# Patient Record
Sex: Female | Born: 1940 | Race: White | Hispanic: No | Smoking: Never smoker
Health system: Southern US, Community
[De-identification: ages and names within clinical notes are randomized; demographics above are authoritative.]

## PROBLEM LIST (undated history)

## (undated) DIAGNOSIS — K219 Gastro-esophageal reflux disease without esophagitis: Secondary | ICD-10-CM

## (undated) DIAGNOSIS — Z9889 Other specified postprocedural states: Secondary | ICD-10-CM

## (undated) DIAGNOSIS — E785 Hyperlipidemia, unspecified: Secondary | ICD-10-CM

## (undated) DIAGNOSIS — M8580 Other specified disorders of bone density and structure, unspecified site: Secondary | ICD-10-CM

## (undated) DIAGNOSIS — D509 Iron deficiency anemia, unspecified: Secondary | ICD-10-CM

## (undated) DIAGNOSIS — R011 Cardiac murmur, unspecified: Secondary | ICD-10-CM

## (undated) DIAGNOSIS — G4733 Obstructive sleep apnea (adult) (pediatric): Secondary | ICD-10-CM

## (undated) DIAGNOSIS — I340 Nonrheumatic mitral (valve) insufficiency: Secondary | ICD-10-CM

## (undated) DIAGNOSIS — I2510 Atherosclerotic heart disease of native coronary artery without angina pectoris: Secondary | ICD-10-CM

## (undated) DIAGNOSIS — F419 Anxiety disorder, unspecified: Secondary | ICD-10-CM

## (undated) DIAGNOSIS — I350 Nonrheumatic aortic (valve) stenosis: Secondary | ICD-10-CM

## (undated) DIAGNOSIS — E119 Type 2 diabetes mellitus without complications: Secondary | ICD-10-CM

## (undated) DIAGNOSIS — M1990 Unspecified osteoarthritis, unspecified site: Secondary | ICD-10-CM

## (undated) DIAGNOSIS — I10 Essential (primary) hypertension: Secondary | ICD-10-CM

## (undated) DIAGNOSIS — N8110 Cystocele, unspecified: Secondary | ICD-10-CM

## (undated) DIAGNOSIS — N183 Chronic kidney disease, stage 3 unspecified: Secondary | ICD-10-CM

## (undated) DIAGNOSIS — R112 Nausea with vomiting, unspecified: Secondary | ICD-10-CM

## (undated) DIAGNOSIS — F32A Depression, unspecified: Secondary | ICD-10-CM

## (undated) DIAGNOSIS — I219 Acute myocardial infarction, unspecified: Secondary | ICD-10-CM

## (undated) HISTORY — DX: Type 2 diabetes mellitus without complications: E11.9

## (undated) HISTORY — PX: CAROTID STENT: SHX1301

## (undated) HISTORY — DX: Atherosclerotic heart disease of native coronary artery without angina pectoris: I25.10

## (undated) HISTORY — PX: EYE SURGERY: SHX253

## (undated) HISTORY — DX: Essential (primary) hypertension: I10

## (undated) HISTORY — DX: Hyperlipidemia, unspecified: E78.5

## (undated) HISTORY — PX: JOINT REPLACEMENT: SHX530

---

## 1981-03-28 HISTORY — PX: CARPAL TUNNEL RELEASE: SHX101

## 1982-03-28 HISTORY — PX: CYST EXCISION: SHX5701

## 2005-03-28 HISTORY — PX: TOTAL HIP ARTHROPLASTY: SHX124

## 2005-03-28 HISTORY — PX: JOINT REPLACEMENT: SHX530

## 2005-05-26 HISTORY — PX: TOTAL HIP ARTHROPLASTY: SHX124

## 2008-03-28 DIAGNOSIS — I252 Old myocardial infarction: Secondary | ICD-10-CM

## 2008-03-28 DIAGNOSIS — I219 Acute myocardial infarction, unspecified: Secondary | ICD-10-CM

## 2008-03-28 HISTORY — DX: Old myocardial infarction: I25.2

## 2008-03-28 HISTORY — PX: CORONARY ANGIOPLASTY WITH STENT PLACEMENT: SHX49

## 2008-03-28 HISTORY — DX: Acute myocardial infarction, unspecified: I21.9

## 2018-03-01 DIAGNOSIS — F329 Major depressive disorder, single episode, unspecified: Secondary | ICD-10-CM | POA: Insufficient documentation

## 2018-03-01 DIAGNOSIS — D509 Iron deficiency anemia, unspecified: Secondary | ICD-10-CM | POA: Insufficient documentation

## 2018-03-01 DIAGNOSIS — K219 Gastro-esophageal reflux disease without esophagitis: Secondary | ICD-10-CM | POA: Insufficient documentation

## 2018-03-01 DIAGNOSIS — M159 Polyosteoarthritis, unspecified: Secondary | ICD-10-CM | POA: Insufficient documentation

## 2018-03-01 DIAGNOSIS — I10 Essential (primary) hypertension: Secondary | ICD-10-CM | POA: Insufficient documentation

## 2018-03-01 DIAGNOSIS — I2510 Atherosclerotic heart disease of native coronary artery without angina pectoris: Secondary | ICD-10-CM | POA: Insufficient documentation

## 2018-03-01 DIAGNOSIS — E782 Mixed hyperlipidemia: Secondary | ICD-10-CM | POA: Insufficient documentation

## 2018-03-01 DIAGNOSIS — F32A Depression, unspecified: Secondary | ICD-10-CM | POA: Insufficient documentation

## 2018-03-01 DIAGNOSIS — E119 Type 2 diabetes mellitus without complications: Secondary | ICD-10-CM | POA: Insufficient documentation

## 2018-03-01 DIAGNOSIS — G4733 Obstructive sleep apnea (adult) (pediatric): Secondary | ICD-10-CM | POA: Insufficient documentation

## 2018-03-01 DIAGNOSIS — Z9989 Dependence on other enabling machines and devices: Secondary | ICD-10-CM

## 2018-03-13 DIAGNOSIS — I350 Nonrheumatic aortic (valve) stenosis: Secondary | ICD-10-CM | POA: Insufficient documentation

## 2018-04-30 ENCOUNTER — Encounter: Payer: Self-pay | Admitting: Psychiatry

## 2018-04-30 ENCOUNTER — Ambulatory Visit (INDEPENDENT_AMBULATORY_CARE_PROVIDER_SITE_OTHER): Payer: Medicare Other | Admitting: Psychiatry

## 2018-04-30 ENCOUNTER — Other Ambulatory Visit: Payer: Self-pay

## 2018-04-30 VITALS — BP 168/83 | HR 60 | Temp 98.1°F | Ht 62.8 in | Wt 180.0 lb

## 2018-04-30 DIAGNOSIS — F411 Generalized anxiety disorder: Secondary | ICD-10-CM | POA: Diagnosis not present

## 2018-04-30 DIAGNOSIS — F410 Panic disorder [episodic paroxysmal anxiety] without agoraphobia: Secondary | ICD-10-CM | POA: Diagnosis not present

## 2018-04-30 DIAGNOSIS — F331 Major depressive disorder, recurrent, moderate: Secondary | ICD-10-CM | POA: Diagnosis not present

## 2018-04-30 MED ORDER — SERTRALINE HCL 100 MG PO TABS: 100.0000 mg | ORAL_TABLET | Freq: Every day | ORAL | 1 refills | Status: DC

## 2018-04-30 MED ORDER — MIRTAZAPINE 15 MG PO TABS: 15.0000 mg | ORAL_TABLET | Freq: Every day | ORAL | 1 refills | Status: DC

## 2018-04-30 NOTE — Progress Notes (Signed)
Psychiatric Initial Adult Assessment   Patient Identification: Jean Stout MRN:  [id number] Date of Evaluation:  04/30/2018 Referral Source: self Chief Complaint:  ' I am here to establish care.' Chief Complaint    Establish Care; Depression; Medication Problem     Visit Diagnosis:    ICD-10-CM   1. MDD (major depressive disorder), recurrent episode, moderate (HCC) F33.1 mirtazapine (REMERON) 15 MG tablet  2. GAD (generalized anxiety disorder) F41.1   3. Panic attacks F41.0     History of Present Illness:  Jean Stout is a 78 year old Caucasian female, widowed, lives in [place], has a history of depression, anxiety, hypertension, hyperlipidemia, coronary artery disease, diabetes melitis, presented to clinic today to establish care.  Patient reports she has been struggling with depressive symptoms since the past few months.  She reports having a history of depression and was started on Zoloft 4 years ago.  Patient reports 2 years back her Zoloft was readjusted to 200 mg.  She continues to take the Zoloft daily however she does not think it is helpful at this time.  Patient reports she relocated from [place] to [place] in November 2019.  She reports she wanted to be closer to her twin sister and that is why she moved here.  Patient reports ever since she moved in here her mood symptoms have been worsening.  She describes anhedonia, lack of motivation, sleep problems, lack of energy, sadness, crying spells and so on on a regular basis.  Pt also reports anxiety symptoms like worrying about everything to the extreme, feeling nervous, restless, negative self-image and so on on a regular basis which has been worsening since the past 1 month.  Patient also reports anxiety attacks.  She reports she had at least 4 panic attacks previously.  The last one was in September 2019.  She reports she had shortness of breath and feeling of impending doom which lasted for a few minutes.  Patient  reports sleep problems.  She reports this has been going on since the past several months.  She reports it is difficult for her to fall asleep.  Reports she used to take melatonin however she felt worse on the melatonin and hence she stopped it.  Patient denies any manic or hypomanic symptoms.  Patient denies any trauma history.  Patient denies any perceptual disturbances.  Patient reports she has social support from her niece.  Patient has several psychosocial stressors including recent relocation, mental health problems of her twin sister, health problems of her daughter, relationship conflicts with her son, financial problems and so on.    Associated Signs/Symptoms: Depression Symptoms:  depressed mood, anhedonia, fatigue, difficulty concentrating, anxiety, panic attacks, disturbed sleep, (Hypo) Manic Symptoms:  denies Anxiety Symptoms:  Excessive Worry, Panic Symptoms, Psychotic Symptoms:  denies PTSD Symptoms: Negative  Past Psychiatric History: Patient was diagnosed with depression 4 years ago.  She reports she has been on the Zoloft ever since.  She denies any suicide attempts.  She denies any inpatient mental health admissions.  Previous Psychotropic Medications: Yes Zoloft, melatonin  Substance Abuse History in the last 12 months:  No.  Consequences of Substance Abuse: Negative  Past Medical History:  Past Medical History:  Diagnosis Date  . Coronary artery disease   . DM (diabetes mellitus) (HCC)   . HLD (hyperlipidemia)   . HTN (hypertension)     Past Surgical History:  Procedure Laterality Date  . CAROTID STENT      Family Psychiatric History: Twin sister-bipolar disorder  Family History:  Family History  Problem Relation Age of Onset  . Bipolar disorder Sister     Social History:   Social History   Socioeconomic History  . Marital status: Widowed    Spouse name: Not on file  . Number of children: 3  . Years of education: Not on file  .  Highest education level: Bachelor's degree (e.g., BA, AB, BS)  Occupational History  . Not on file  Social Needs  . Financial resource strain: Hard  . Food insecurity:    Worry: Often true    Inability: Often true  . Transportation needs:    Medical: No    Non-medical: No  Tobacco Use  . Smoking status: Never Smoker  . Smokeless tobacco: Never Used  Substance and Sexual Activity  . Alcohol use: Not Currently    Frequency: Never  . Drug use: Never  . Sexual activity: Not Currently  Lifestyle  . Physical activity:    Days per week: 5 days    Minutes per session: 20 min  . Stress: Rather much  Relationships  . Social connections:    Talks on phone: Not on file    Gets together: Not on file    Attends religious service: More than 4 times per year    Active member of club or organization: Yes    Attends meetings of clubs or organizations: Never    Relationship status: Widowed  Other Topics Concern  . Not on file  Social History Narrative  . Not on file    Additional Social History: Patient is widowed.  She currently lives in [place].  She recently relocated from [place].  She is a retired [employer] fundraiser.  She came to [facility] to be closer to her twin sister.  Patient has 2 sons and 1 daughter.  She has 11 grandchildren.  Allergies:   Allergies  Allergen Reactions  . Amlodipine Besy-Benazepril Hcl Rash  . Gadolinium Derivatives Rash  . Valdecoxib Rash    Metabolic Disorder Labs: No results found for: HGBA1C, MPG No results found for: PROLACTIN No results found for: CHOL, TRIG, HDL, CHOLHDL, VLDL, LDLCALC No results found for: TSH  Therapeutic Level Labs: No results found for: LITHIUM No results found for: CBMZ No results found for: VALPROATE  Current Medications: Current Outpatient Medications  Medication Sig Dispense Refill  . amLODipine (NORVASC) 10 MG tablet Take by mouth.    Marland Kitchen aspirin EC 81 MG tablet Take by mouth.    Marland Kitchen atorvastatin (LIPITOR) 40 MG tablet Take by  mouth.    . docusate sodium (COLACE) 100 MG capsule Take by mouth.    . ferrous sulfate 325 (65 FE) MG tablet Take by mouth.    . hydrALAZINE (APRESOLINE) 50 MG tablet Take by mouth 3 (three) times daily.     . hydrochlorothiazide (HYDRODIURIL) 12.5 MG tablet Take by mouth.    Marland Kitchen lisinopril (PRINIVIL,ZESTRIL) 40 MG tablet Take by mouth.    . metFORMIN (GLUCOPHAGE-XR) 500 MG 24 hr tablet Take by mouth.    . naproxen sodium (ALEVE) 220 MG tablet Take by mouth.    . pantoprazole (PROTONIX) 40 MG tablet Take by mouth.    . sertraline (ZOLOFT) 100 MG tablet Take 1 tablet (100 mg total) by mouth daily. 30 tablet 1  . mirtazapine (REMERON) 15 MG tablet Take 1 tablet (15 mg total) by mouth at bedtime. 30 tablet 1   No current facility-administered medications for this visit.     Musculoskeletal: Strength &  Muscle Tone: within normal limits Gait & Station: normal Patient leans: N/A  Psychiatric Specialty Exam: Review of Systems  Psychiatric/Behavioral: Positive for depression. The patient is nervous/anxious and has insomnia.   All other systems reviewed and are negative.   Blood pressure (!) 168/83, pulse 60, temperature 98.1 F (36.7 C), temperature source Oral, height 5' 2.8" (1.595 m), weight 180 lb (81.6 kg).Body mass index is 32.09 kg/m.  General Appearance: Casual  Eye Contact:  Fair  Speech:  Clear and Coherent  Volume:  Normal  Mood:  Depressed  Affect:  Appropriate  Thought Process:  Goal Directed and Descriptions of Associations: Intact  Orientation:  Full (Time, Place, and Person)  Thought Content:  Logical  Suicidal Thoughts:  No  Homicidal Thoughts:  No  Memory:  Immediate;   Fair Recent;   Fair Remote;   Fair  Judgement:  Fair  Insight:  Fair  Psychomotor Activity:  Normal  Concentration:  Concentration: Fair and Attention Span: Fair  Recall:  FiservFair  Fund of Knowledge:Fair  Language: Fair  Akathisia:  No  Handed:  Right  AIMS (if indicated):denies tremors,  rigidity,stiffness  Assets:  Communication Skills Desire for Improvement Housing Social Support  ADL's:  Intact  Cognition: WNL  Sleep:  Poor   Screenings:   Assessment and Plan: Claris CheMargaret is a 78 year old Caucasian female who is retired, lives in [place], recently relocated from [place], has a history of depression, hyperlipidemia, hypertension, diabetes mellitus, coronary artery disease, presented to clinic today to establish care.  Patient is biologically predisposed given her family history.  Patient also has several psychosocial stressors including financial as well as recent relocation.  Patient will benefit from medications as well as psychotherapy sessions.  Plan MDD PHQ 9 equals 21 Reduce Zoloft to 100 mg p.o. daily Start mirtazapine 15 mg p.o. nightly Referral for CBT with therapist-Ms. Felecia Janina Thompson.  For GAD GAD 7 equals 18 Zoloft as prescribed Mirtazapine will also help. Refer for CBT  For panic attacks Refer for CBT.  For insomnia OSA on CPAP Start mirtazapine as prescribed  I will order the following labs-TSH, vitamin b12.  Discussed with patient elevated blood pressure reading today, discussed with her to reach out to her primary medical doctor.  Follow-up in clinic in 2 weeks or sooner if needed.  I have spent atleast 60 minutes face to face with patient today. More than 50 % of the time was spent for psychoeducation and supportive psychotherapy and care coordination.  This note was generated in part or whole with voice recognition software. Voice recognition is usually quite accurate but there are transcription errors that can and very often do occur. I apologize for any typographical errors that were not detected and corrected.         Jomarie LongsSaramma Sady Monaco, MD 2/3/20201:59 PM

## 2018-04-30 NOTE — Patient Instructions (Signed)
Mirtazapine tablets  What is this medicine?  MIRTAZAPINE (mir TAZ a peen) is used to treat depression.  This medicine may be used for other purposes; ask your health care provider or pharmacist if you have questions.  COMMON BRAND NAME(S): Remeron  What should I tell my health care provider before I take this medicine?  They need to know if you have any of these conditions:  -bipolar disorder  -glaucoma  -kidney disease  -liver disease  -suicidal thoughts  -an unusual or allergic reaction to mirtazapine, other medicines, foods, dyes, or preservatives  -pregnant or trying to get pregnant  -breast-feeding  How should I use this medicine?  Take this medicine by mouth with a glass of water. Follow the directions on the prescription label. Take your medicine at regular intervals. Do not take your medicine more often than directed. Do not stop taking this medicine suddenly except upon the advice of your doctor. Stopping this medicine too quickly may cause serious side effects or your condition may worsen.  A special MedGuide will be given to you by the pharmacist with each prescription and refill. Be sure to read this information carefully each time.  Talk to your pediatrician regarding the use of this medicine in children. Special care may be needed.  Overdosage: If you think you have taken too much of this medicine contact a poison control center or emergency room at once.  NOTE: This medicine is only for you. Do not share this medicine with others.  What if I miss a dose?  If you miss a dose, take it as soon as you can. If it is almost time for your next dose, take only that dose. Do not take double or extra doses.  What may interact with this medicine?  Do not take this medicine with any of the following medications:  -linezolid  -MAOIs like Carbex, Eldepryl, Marplan, Nardil, and Parnate  -methylene blue (injected into a vein)  This medicine may also interact with the following medications:  -alcohol  -antiviral  medicines for HIV or AIDS  -certain medicines that treat or prevent blood clots like warfarin  -certain medicines for depression, anxiety, or psychotic disturbances  -certain medicines for fungal infections like ketoconazole and itraconazole  -certain medicines for migraine headache like almotriptan, eletriptan, frovatriptan, naratriptan, rizatriptan, sumatriptan, zolmitriptan  -certain medicines for seizures like carbamazepine or phenytoin  -certain medicines for sleep  -cimetidine  -erythromycin  -fentanyl  -lithium  -medicines for blood pressure  -nefazodone  -rasagiline  -rifampin  -supplements like St. John's wort, kava kava, valerian  -tramadol  -tryptophan  This list may not describe all possible interactions. Give your health care provider a list of all the medicines, herbs, non-prescription drugs, or dietary supplements you use. Also tell them if you smoke, drink alcohol, or use illegal drugs. Some items may interact with your medicine.  What should I watch for while using this medicine?  Tell your doctor if your symptoms do not get better or if they get worse. Visit your doctor or health care professional for regular checks on your progress. Because it may take several weeks to see the full effects of this medicine, it is important to continue your treatment as prescribed by your doctor.  Patients and their families should watch out for new or worsening thoughts of suicide or depression. Also watch out for sudden changes in feelings such as feeling anxious, agitated, panicky, irritable, hostile, aggressive, impulsive, severely restless, overly excited and hyperactive, or not being   able to sleep. If this happens, especially at the beginning of treatment or after a change in dose, call your health care professional.  You may get drowsy or dizzy. Do not drive, use machinery, or do anything that needs mental alertness until you know how this medicine affects you. Do not stand or sit up quickly, especially if  you are an older patient. This reduces the risk of dizzy or fainting spells. Alcohol may interfere with the effect of this medicine. Avoid alcoholic drinks.  This medicine may cause dry eyes and blurred vision. If you wear contact lenses you may feel some discomfort. Lubricating drops may help. See your eye doctor if the problem does not go away or is severe.  Your mouth may get dry. Chewing sugarless gum or sucking hard candy, and drinking plenty of water may help. Contact your doctor if the problem does not go away or is severe.  What side effects may I notice from receiving this medicine?  Side effects that you should report to your doctor or health care professional as soon as possible:  -allergic reactions like skin rash, itching or hives, swelling of the face, lips, or tongue  -anxious  -changes in vision  -chest pain  -confusion  -elevated mood, decreased need for sleep, racing thoughts, impulsive behavior  -eye pain  -fast, irregular heartbeat  -feeling faint or lightheaded, falls  -feeling agitated, angry, or irritable  -fever or chills, sore throat  -hallucination, loss of contact with reality  -loss of balance or coordination  -mouth sores  -redness, blistering, peeling or loosening of the skin, including inside the mouth  -restlessness, pacing, inability to keep still  -seizures  -stiff muscles  -suicidal thoughts or other mood changes  -trouble passing urine or change in the amount of urine  -trouble sleeping  -unusual bleeding or bruising  -unusually weak or tired  -vomiting  Side effects that usually do not require medical attention (report to your doctor or health care professional if they continue or are bothersome):  -change in appetite  -constipation  -dizziness  -dry mouth  -muscle aches or pains  -nausea  -tired  -weight gain  This list may not describe all possible side effects. Call your doctor for medical advice about side effects. You may report side effects to FDA at 1-800-FDA-1088.  Where  should I keep my medicine?  Keep out of the reach of children.  Store at room temperature between 15 and 30 degrees C (59 and 86 degrees F) Protect from light and moisture. Throw away any unused medicine after the expiration date.  NOTE: This sheet is a summary. It may not cover all possible information. If you have questions about this medicine, talk to your doctor, pharmacist, or health care provider.  © 2019 [employer] (2015-08-13 17:30:45)

## 2018-05-01 ENCOUNTER — Telehealth: Payer: Self-pay

## 2018-05-01 NOTE — Telephone Encounter (Signed)
Called patient and asked if she can see if her pcp can order the labwork or if they have done recently to have them fax a copy to our office.

## 2018-05-01 NOTE — Telephone Encounter (Signed)
pt called states she can not get her labwork done. she states she went to [facility] and they told her that it was the wrong code and that medicare would not pay for test with that code.

## 2018-05-01 NOTE — Telephone Encounter (Signed)
Ok . Pls ask her if she can ask her Primary to do it for her.

## 2018-05-02 ENCOUNTER — Telehealth: Payer: Self-pay | Admitting: Psychiatry

## 2018-05-02 NOTE — Telephone Encounter (Signed)
I have reviewed medical records received from [facility] clinic, family medicine. Reviewed CBC with differential reported 03/02/2018-RBC-low at 3.80, hemoglobin -low at 11.8, MPV-low at 8.7-otherwise within normal limits, CMP-globular filtration rate at 54-low, otherwise within normal limits Hemoglobin A1c elevated at 6.1-she will continue to follow-up with her primary medical doctor for management Lipid panel-total cholesterol-within normal limits, LDL, HDL-within normal limits Iron panel -total iron binding capacity, ferritin-within normal limits TSH pending.

## 2018-05-21 ENCOUNTER — Ambulatory Visit (INDEPENDENT_AMBULATORY_CARE_PROVIDER_SITE_OTHER): Payer: Medicare Other | Admitting: Psychiatry

## 2018-05-21 ENCOUNTER — Other Ambulatory Visit: Payer: Self-pay

## 2018-05-21 ENCOUNTER — Encounter: Payer: Self-pay | Admitting: Psychiatry

## 2018-05-21 VITALS — BP 141/64 | HR 52 | Temp 98.3°F | Wt 177.6 lb

## 2018-05-21 DIAGNOSIS — F410 Panic disorder [episodic paroxysmal anxiety] without agoraphobia: Secondary | ICD-10-CM | POA: Diagnosis not present

## 2018-05-21 DIAGNOSIS — F331 Major depressive disorder, recurrent, moderate: Secondary | ICD-10-CM | POA: Diagnosis not present

## 2018-05-21 DIAGNOSIS — F411 Generalized anxiety disorder: Secondary | ICD-10-CM | POA: Diagnosis not present

## 2018-05-21 MED ORDER — DULOXETINE HCL 30 MG PO CPEP: 30.0000 mg | ORAL_CAPSULE | Freq: Every day | ORAL | 1 refills | Status: DC

## 2018-05-21 NOTE — Progress Notes (Signed)
BH MD OP Progress Note  05/21/2018 12:20 PM Jean Stout  MRN:  [id number]  Chief Complaint: ' I am here for follow up.' Chief Complaint    Follow-up; Medication Refill; Pain     HPI: Jean Stout is a 78 yr old patient female, widowed, lives in [place], has a history of depression, anxiety, hypertension, hyperlipidemia, coronary artery disease, diabetes melitis, presented to clinic today for a follow-up visit.  Patient today reports she stopped taking the Zoloft altogether.  She denies any withdrawal symptoms.  She also reports she could not take the mirtazapine for sleep since it made her appetite higher.  She reports for 3 days she kept eating and eating and hence stopped taking it.  She reports she is currently on a keto diet which changed a lot for her.  She reports she is actually losing weight.  She also feels able to do more stuff around her house.  She was able to unpack and organize a lot around her house.  This is the first time she has felt this way in the past 2 to 3 months.  Patient reports she does sleep however it takes an hour or so for her to fall asleep.  She is currently back on melatonin.  Patient reports she struggles with pain a lot.  She used to be on tramadol previously however her provider stopped prescribing it for her.  She is trying to get an appointment with pain management.  She is currently taking meloxicam however it does not help much.  Patient wonders if she can be prescribed Cymbalta which also helps with pain.  She reports that even though she feels the past 2 weeks has been better she does struggle with depression and anxiety on and off.  Patient reports she had to reschedule her appointment with Ms. Felecia Jan and has an upcoming appointment March 11.  She looks forward to it.  Patient denies any other concerns today. Visit Diagnosis:    ICD-10-CM   1. MDD (major depressive disorder), recurrent episode, moderate (HCC) F33.1 DULoxetine (CYMBALTA) 30 MG  capsule  2. GAD (generalized anxiety disorder) F41.1 DULoxetine (CYMBALTA) 30 MG capsule  3. Panic attacks F41.0     Past Psychiatric History: Reviewed past psychiatric history from my progress note on 04/30/2018.  Past trials of Zoloft, melatonin  Past Medical History:  Past Medical History:  Diagnosis Date  . Coronary artery disease   . DM (diabetes mellitus) (HCC)   . HLD (hyperlipidemia)   . HTN (hypertension)     Past Surgical History:  Procedure Laterality Date  . CAROTID STENT      Family Psychiatric History: I have reviewed family psychiatric history from my progress note on 04/30/2018 Family History:  Family History  Problem Relation Age of Onset  . Bipolar disorder Sister     Social History: Reviewed social history from my progress note on 04/30/2018. Social History   Socioeconomic History  . Marital status: Widowed    Spouse name: Not on file  . Number of children: 3  . Years of education: Not on file  . Highest education level: Bachelor's degree (e.g., BA, AB, BS)  Occupational History  . Not on file  Social Needs  . Financial resource strain: Hard  . Food insecurity:    Worry: Often true    Inability: Often true  . Transportation needs:    Medical: No    Non-medical: No  Tobacco Use  . Smoking status: Never Smoker  . Smokeless  tobacco: Never Used  Substance and Sexual Activity  . Alcohol use: Not Currently    Frequency: Never  . Drug use: Never  . Sexual activity: Not Currently  Lifestyle  . Physical activity:    Days per week: 5 days    Minutes per session: 20 min  . Stress: Rather much  Relationships  . Social connections:    Talks on phone: Not on file    Gets together: Not on file    Attends religious service: More than 4 times per year    Active member of club or organization: Yes    Attends meetings of clubs or organizations: Never    Relationship status: Widowed  Other Topics Concern  . Not on file  Social History Narrative  . Not on  file    Allergies:  Allergies  Allergen Reactions  . Amlodipine Besy-Benazepril Hcl Rash  . Gadolinium Derivatives Rash  . Valdecoxib Rash    Metabolic Disorder Labs: No results found for: HGBA1C, MPG No results found for: PROLACTIN No results found for: CHOL, TRIG, HDL, CHOLHDL, VLDL, LDLCALC No results found for: TSH  Therapeutic Level Labs: No results found for: LITHIUM No results found for: VALPROATE No components found for:  CBMZ  Current Medications: Current Outpatient Medications  Medication Sig Dispense Refill  . amLODipine (NORVASC) 10 MG tablet Take by mouth.    Marland Kitchen aspirin EC 81 MG tablet Take by mouth.    Marland Kitchen atorvastatin (LIPITOR) 40 MG tablet Take by mouth.    . docusate sodium (COLACE) 100 MG capsule Take by mouth.    . ferrous sulfate 325 (65 FE) MG tablet Take by mouth.    . hydrALAZINE (APRESOLINE) 50 MG tablet Take by mouth 3 (three) times daily.     . hydrochlorothiazide (HYDRODIURIL) 12.5 MG tablet Take by mouth.    Marland Kitchen lisinopril (PRINIVIL,ZESTRIL) 40 MG tablet Take by mouth.    . meloxicam (MOBIC) 15 MG tablet Take by mouth.    . metFORMIN (GLUCOPHAGE-XR) 500 MG 24 hr tablet Take by mouth.    . naproxen sodium (ALEVE) 220 MG tablet Take by mouth.    . pantoprazole (PROTONIX) 40 MG tablet Take by mouth.    . DULoxetine (CYMBALTA) 30 MG capsule Take 1 capsule (30 mg total) by mouth daily. 30 capsule 1   No current facility-administered medications for this visit.      Musculoskeletal: Strength & Muscle Tone: within normal limits Gait & Station: normal Patient leans: N/A  Psychiatric Specialty Exam: Review of Systems  Psychiatric/Behavioral: Positive for depression. The patient is nervous/anxious.   All other systems reviewed and are negative.   Blood pressure (!) 141/64, pulse (!) 52, temperature 98.3 F (36.8 C), temperature source Oral, weight 177 lb 9.6 oz (80.6 kg).Body mass index is 31.67 kg/m.  General Appearance: Casual  Eye Contact:   Fair  Speech:  Clear and Coherent  Volume:  Normal  Mood:  Anxious and Depressed  Affect:  Congruent  Thought Process:  Goal Directed and Descriptions of Associations: Intact  Orientation:  Full (Time, Place, and Person)  Thought Content: Logical   Suicidal Thoughts:  No  Homicidal Thoughts:  No  Memory:  Immediate;   Fair Recent;   Fair Remote;   Fair  Judgement:  Fair  Insight:  Fair  Psychomotor Activity:  Normal  Concentration:  Concentration: Fair and Attention Span: Fair  Recall:  [employer] of Knowledge: Fair  Language: Fair  Akathisia:  No  Handed:  Right  AIMS (if indicated): denies tremors, rigidity,stiffness  Assets:  Communication Skills Desire for Improvement Social Support  ADL's:  Intact  Cognition: WNL  Sleep:  restless   Screenings:   Assessment and Plan: Raniyah is a 78 year old Caucasian female, retired, lives in [place], recently relocated from [place], has a history of depression, hyperlipidemia, hypertension, diabetes melitis, coronary artery disease, presented to clinic today for a follow-up visit.  Patient is biologically predisposed given her family history.  Patient also has psychosocial stressors including financial as well as recent relocation.  Did not tolerate medications prescribed last visit and hence discussed plan as noted below.  Plan MDD- unstable Start Cymbalta 30 mg p.o. daily. Discontinue Zoloft for noncompliance Discontinue mirtazapine for side effects.  GAD-unstable Start Cymbalta 30 mg p.o. daily Patient to start psychotherapy sessions with Ms. Felecia Jan.  Panic attacks-unstable Referred for CBT  For insomnia-unstable Currently on CPAP for OSA. Patient is currently on melatonin.  Follow-up in clinic in 4 weeks or sooner if needed.  I have spent atleast 15 minutes face to face with patient today. More than 50 % of the time was spent for psychoeducation and supportive psychotherapy and care coordination.  This  note was generated in part or whole with voice recognition software. Voice recognition is usually quite accurate but there are transcription errors that can and very often do occur. I apologize for any typographical errors that were not detected and corrected.        Jomarie Longs, MD 05/21/2018, 12:20 PM

## 2018-05-21 NOTE — Patient Instructions (Signed)

## 2018-06-19 ENCOUNTER — Ambulatory Visit (INDEPENDENT_AMBULATORY_CARE_PROVIDER_SITE_OTHER): Payer: Medicare Other | Admitting: Psychiatry

## 2018-06-19 ENCOUNTER — Other Ambulatory Visit: Payer: Self-pay

## 2018-06-19 ENCOUNTER — Encounter: Payer: Self-pay | Admitting: Psychiatry

## 2018-06-19 VITALS — BP 149/57 | HR 56 | Temp 97.9°F | Wt 171.2 lb

## 2018-06-19 DIAGNOSIS — F410 Panic disorder [episodic paroxysmal anxiety] without agoraphobia: Secondary | ICD-10-CM

## 2018-06-19 DIAGNOSIS — F411 Generalized anxiety disorder: Secondary | ICD-10-CM

## 2018-06-19 DIAGNOSIS — F331 Major depressive disorder, recurrent, moderate: Secondary | ICD-10-CM

## 2018-06-19 MED ORDER — DULOXETINE HCL 30 MG PO CPEP: 30.0000 mg | ORAL_CAPSULE | Freq: Every day | ORAL | 1 refills | Status: DC

## 2018-06-19 NOTE — Progress Notes (Signed)
BH MD  OP Progress Note  06/19/2018 12:55 PM Jean Stout  MRN:  [id number]  Chief Complaint: ' I am here for follow up." Chief Complaint    Follow-up; Medication Refill     HPI: Jean Stout is a 78 yr old female, widowed, lives in [place], has a history of depression, anxiety, panic attacks, hyperlipidemia, hypertension, coronary artery disease, diabetes melitis, presented to clinic today for a follow-up visit.  Patient today reports she is doing well on the Cymbalta.  She takes 30 mg daily.  She reports her mood symptoms is improved.  She reports she has good energy, she is able to organize task and is more engaged.    She reports sleep is better.  She reports she does not have any difficulty falling asleep.  She used to take melatonin however has stopped taking it.  She reports she is able to sleep by 8:30 PM however wakes up at around 1:30 AM .  She gets around 5 hours of sleep.  She reports soon after that she wakes up and gets ready and get things in order, tries to help her sister who has mental health problems and also people at church.  She reports she has to go to church every day in the morning.  Discussed with patient about her waking up too early at 1:30 AM daily.  Patient does not think it is a problem for her.  Discussed with patient to try taking melatonin to work on her sleep wake cycle.  Discussed with her to keep a log of her sleep and bring it back when she comes back.  Discussed with patient about monitoring herself for hypomanic or manic symptoms.  She does have a history of bipolar disorder in her family.  Patient continues to be in therapy with Ms. Felecia Jan and reports it is beneficial.  Visit Diagnosis:    ICD-10-CM   1. MDD (major depressive disorder), recurrent episode, moderate (HCC) F33.1 DULoxetine (CYMBALTA) 30 MG capsule  2. GAD (generalized anxiety disorder) F41.1 DULoxetine (CYMBALTA) 30 MG capsule  3. Panic attacks F41.0     Past Psychiatric History: I  have reviewed past psychiatric history from my progress note on 04/30/2018.  Past trials of Zoloft, melatonin, mirtazapine.  Past Medical History:  Past Medical History:  Diagnosis Date  . Coronary artery disease   . DM (diabetes mellitus) (HCC)   . HLD (hyperlipidemia)   . HTN (hypertension)     Past Surgical History:  Procedure Laterality Date  . CAROTID STENT      Family Psychiatric History: Reviewed family psychiatric history from my progress note on 04/30/2018.  Family History:  Family History  Problem Relation Age of Onset  . Bipolar disorder Sister     Social History: Reviewed social history from my progress note on 04/30/2018. Social History   Socioeconomic History  . Marital status: Widowed    Spouse name: Not on file  . Number of children: 3  . Years of education: Not on file  . Highest education level: Bachelor's degree (e.g., BA, AB, BS)  Occupational History  . Not on file  Social Needs  . Financial resource strain: Hard  . Food insecurity:    Worry: Often true    Inability: Often true  . Transportation needs:    Medical: No    Non-medical: No  Tobacco Use  . Smoking status: Never Smoker  . Smokeless tobacco: Never Used  Substance and Sexual Activity  . Alcohol use: Not Currently  Frequency: Never  . Drug use: Never  . Sexual activity: Not Currently  Lifestyle  . Physical activity:    Days per week: 5 days    Minutes per session: 20 min  . Stress: Rather much  Relationships  . Social connections:    Talks on phone: Not on file    Gets together: Not on file    Attends religious service: More than 4 times per year    Active member of club or organization: Yes    Attends meetings of clubs or organizations: Never    Relationship status: Widowed  Other Topics Concern  . Not on file  Social History Narrative  . Not on file    Allergies:  Allergies  Allergen Reactions  . Amlodipine Besy-Benazepril Hcl Rash  . Gadolinium Derivatives Rash  .  Valdecoxib Rash    Metabolic Disorder Labs: No results found for: HGBA1C, MPG No results found for: PROLACTIN No results found for: CHOL, TRIG, HDL, CHOLHDL, VLDL, LDLCALC No results found for: TSH  Therapeutic Level Labs: No results found for: LITHIUM No results found for: VALPROATE No components found for:  CBMZ  Current Medications: Current Outpatient Medications  Medication Sig Dispense Refill  . amLODipine (NORVASC) 10 MG tablet Take by mouth.    Marland Kitchen aspirin EC 81 MG tablet Take by mouth.    Marland Kitchen atorvastatin (LIPITOR) 40 MG tablet Take by mouth.    . docusate sodium (COLACE) 100 MG capsule Take by mouth.    . DULoxetine (CYMBALTA) 30 MG capsule Take 1 capsule (30 mg total) by mouth daily. 30 capsule 1  . ferrous sulfate 325 (65 FE) MG tablet Take by mouth.    . hydrALAZINE (APRESOLINE) 50 MG tablet Take by mouth 3 (three) times daily.     . hydrochlorothiazide (HYDRODIURIL) 12.5 MG tablet Take by mouth.    Marland Kitchen lisinopril (PRINIVIL,ZESTRIL) 40 MG tablet Take by mouth.    . meloxicam (MOBIC) 15 MG tablet Take by mouth.    . metFORMIN (GLUCOPHAGE-XR) 500 MG 24 hr tablet Take by mouth.    . naproxen sodium (ALEVE) 220 MG tablet Take by mouth.    . pantoprazole (PROTONIX) 40 MG tablet Take by mouth.     No current facility-administered medications for this visit.      Musculoskeletal: Strength & Muscle Tone: within normal limits Gait & Station: normal Patient leans: N/A  Psychiatric Specialty Exam: Review of Systems  Psychiatric/Behavioral: The patient has insomnia (reports she sleeps 5 hrs, and feels rested).   All other systems reviewed and are negative.   Blood pressure (!) 149/57, pulse (!) 56, temperature 97.9 F (36.6 C), temperature source Oral, weight 171 lb 3.2 oz (77.7 kg).Body mass index is 30.53 kg/m.  General Appearance: Casual  Eye Contact:  Fair  Speech:  Clear and Coherent  Volume:  Normal  Mood:  Euthymic  Affect:  Congruent  Thought Process:  Goal  Directed and Descriptions of Associations: Intact  Orientation:  Full (Time, Place, and Person)  Thought Content: Logical   Suicidal Thoughts:  No  Homicidal Thoughts:  No  Memory:  Immediate;   Fair Recent;   Fair Remote;   Fair  Judgement:  Fair  Insight:  Fair  Psychomotor Activity:  Normal  Concentration:  Concentration: Fair and Attention Span: Fair  Recall:  [employer] of Knowledge: Fair  Language: Fair  Akathisia:  No  Handed:  Right  AIMS (if indicated): Denies tremors, rigidity, stiffness  Assets:  Communication  Skills Desire for Improvement Social Support  ADL's:  Intact  Cognition: WNL  Sleep:  restless   Screenings:   Assessment and Plan: Glori is a 78 year old Caucasian female, retired, lives in [place], recently relocated from [place], has a history of depression, hyperlipidemia, hypertension, diabetes melitis, coronary artery disease, presented to clinic today for a follow-up visit.  Patient is biologically predisposed given her family history.  Patient also has psychosocial stressors including financial as well as recent relocation.  Patient currently reports making progress however will monitor her for hypomanic or manic episodes.  This was discussed with patient.  Plan as noted below. Plan MDD- improving Cymbalta 30 mg p.o. daily Continue CBT.  For GAD-improving Cymbalta 30 mg p.o. daily  Panic attacks-improving Continue CBT  For insomnia- improving however she wakes up too early.  She does not think of it is a problem.  Unknown if she does have some hypomanic symptoms, we will continue to monitor. Discussed with patient to keep a sleep log. Discussed with her to restart using CPAP, she is noncompliant. Discussed with patient to restart using melatonin to work on her sleep wake cycle.  Follow-up in clinic in 1 month or sooner if needed.  I have spent atleast 15 MINUTES  face to face with patient today. More than 50 % of the time was spent for  psychoeducation and supportive psychotherapy and care coordination.  This note was generated in part or whole with voice recognition software. Voice recognition is usually quite accurate but there are transcription errors that can and very often do occur. I apologize for any typographical errors that were not detected and corrected.       Jomarie Longs, MD 06/19/2018, 12:55 PM

## 2018-07-11 ENCOUNTER — Other Ambulatory Visit: Payer: Self-pay | Admitting: Psychiatry

## 2018-07-11 DIAGNOSIS — F411 Generalized anxiety disorder: Secondary | ICD-10-CM

## 2018-07-11 DIAGNOSIS — F331 Major depressive disorder, recurrent, moderate: Secondary | ICD-10-CM

## 2018-07-19 ENCOUNTER — Ambulatory Visit: Payer: Medicare Other | Admitting: Psychiatry

## 2018-08-21 ENCOUNTER — Ambulatory Visit (INDEPENDENT_AMBULATORY_CARE_PROVIDER_SITE_OTHER): Payer: Medicare Other | Admitting: Psychiatry

## 2018-08-21 ENCOUNTER — Other Ambulatory Visit: Payer: Self-pay

## 2018-08-21 ENCOUNTER — Encounter: Payer: Self-pay | Admitting: Psychiatry

## 2018-08-21 DIAGNOSIS — F5105 Insomnia due to other mental disorder: Secondary | ICD-10-CM

## 2018-08-21 DIAGNOSIS — F331 Major depressive disorder, recurrent, moderate: Secondary | ICD-10-CM | POA: Diagnosis not present

## 2018-08-21 DIAGNOSIS — F411 Generalized anxiety disorder: Secondary | ICD-10-CM | POA: Diagnosis not present

## 2018-08-21 DIAGNOSIS — F410 Panic disorder [episodic paroxysmal anxiety] without agoraphobia: Secondary | ICD-10-CM | POA: Diagnosis not present

## 2018-08-21 MED ORDER — DULOXETINE HCL 60 MG PO CPEP: 60.0000 mg | ORAL_CAPSULE | Freq: Every day | ORAL | 1 refills | Status: DC

## 2018-08-21 NOTE — Progress Notes (Signed)
Virtual Visit via Telephone Note  I connected with Jean Stout on 08/21/18 at  1:30 PM EDT by telephone and verified that I am speaking with the correct person using two identifiers.   I discussed the limitations, risks, security and privacy concerns of performing an evaluation and management service by telephone and the availability of in person appointments. I also discussed with the patient that there may be a patient responsible charge related to this service. The patient expressed understanding and agreed to proceed.    I discussed the assessment and treatment plan with the patient. The patient was provided an opportunity to ask questions and all were answered. The patient agreed with the plan and demonstrated an understanding of the instructions.   The patient was advised to call back or seek an in-person evaluation if the symptoms worsen or if the condition fails to improve as anticipated.  BH MD OP Progress Note  08/21/2018 5:50 PM Jean MinsMargaret Stout  MRN:  [id number]  Chief Complaint:  Chief Complaint    Follow-up     HPI: Jean Stout is a 78 year old Caucasian female, widowed, lives in [place], has a history of MDD, GAD, panic attacks, hyperlipidemia, hypertension, coronary artery disease, diabetes melitis, was evaluated by phone today.  Patient today reports she has been struggling with depressive symptoms since the past several weeks.  She reports the COVID-19 social isolation has been making her depressive symptoms worse.  She stays home all day and has no social contact.  She has not been able to talk to her sisters in a long time.  She  does have a pet dog and she spends time with it.  She has not been able to talk to her therapist Ms. Felecia Janina Thompson in a while due to the COVID-19 outbreak.  She reports she struggles with sadness, lack of appetite, low motivation as well as possible excessive sleep.  She goes to bed at 7 PM and tries to stay in bed till 7 or 8 the next day.  She has  been compliant with her Cymbalta.  Discussed readjusting her dosage and she agrees with plan.  Also discussed reaching out to Ms. Felecia Janina Thompson to restart psychotherapy sessions.  Patient denies any suicidality, homicidality or perceptual disturbances.  She appeared to be alert, oriented to person place and situation. Visit Diagnosis:    ICD-10-CM   1. MDD (major depressive disorder), recurrent episode, moderate (HCC) F33.1 DULoxetine (CYMBALTA) 60 MG capsule  2. GAD (generalized anxiety disorder) F41.1 DULoxetine (CYMBALTA) 60 MG capsule  3. Panic attacks F41.0   4. Insomnia due to mental condition F51.05     Past Psychiatric History: I have reviewed past psychiatric history from my progress note on 04/30/2018.  Past trials of Zoloft, melatonin, mirtazapine.  Past Medical History:  Past Medical History:  Diagnosis Date  . Coronary artery disease   . DM (diabetes mellitus) (HCC)   . HLD (hyperlipidemia)   . HTN (hypertension)     Past Surgical History:  Procedure Laterality Date  . CAROTID STENT      Family Psychiatric History: I have reviewed family psychiatric history from my progress note on 04/30/2018.  Family History:  Family History  Problem Relation Age of Onset  . Bipolar disorder Sister     Social History: Reviewed social history from my progress note on 04/30/2018. Social History   Socioeconomic History  . Marital status: Widowed    Spouse name: Not on file  . Number of children: 3  . Years  of education: Not on file  . Highest education level: Bachelor's degree (e.g., BA, AB, BS)  Occupational History  . Not on file  Social Needs  . Financial resource strain: Hard  . Food insecurity:    Worry: Often true    Inability: Often true  . Transportation needs:    Medical: No    Non-medical: No  Tobacco Use  . Smoking status: Never Smoker  . Smokeless tobacco: Never Used  Substance and Sexual Activity  . Alcohol use: Not Currently    Frequency: Never  . Drug  use: Never  . Sexual activity: Not Currently  Lifestyle  . Physical activity:    Days per week: 5 days    Minutes per session: 20 min  . Stress: Rather much  Relationships  . Social connections:    Talks on phone: Not on file    Gets together: Not on file    Attends religious service: More than 4 times per year    Active member of club or organization: Yes    Attends meetings of clubs or organizations: Never    Relationship status: Widowed  Other Topics Concern  . Not on file  Social History Narrative  . Not on file    Allergies:  Allergies  Allergen Reactions  . Amlodipine Besy-Benazepril Hcl Rash  . Gadolinium Derivatives Rash  . Valdecoxib Rash    Metabolic Disorder Labs: No results found for: HGBA1C, MPG No results found for: PROLACTIN No results found for: CHOL, TRIG, HDL, CHOLHDL, VLDL, LDLCALC No results found for: TSH  Therapeutic Level Labs: No results found for: LITHIUM No results found for: VALPROATE No components found for:  CBMZ  Current Medications: Current Outpatient Medications  Medication Sig Dispense Refill  . cetirizine (ZYRTEC) 10 MG tablet Take by mouth.    . Acetaminophen 500 MG coapsule Take by mouth.    Marland Kitchen amLODipine (NORVASC) 10 MG tablet Take by mouth.    Marland Kitchen aspirin EC 81 MG tablet Take by mouth.    Marland Kitchen atorvastatin (LIPITOR) 40 MG tablet Take by mouth.    . docusate sodium (COLACE) 100 MG capsule Take by mouth.    . DULoxetine (CYMBALTA) 60 MG capsule Take 1 capsule (60 mg total) by mouth daily. 90 capsule 1  . ferrous sulfate 325 (65 FE) MG tablet Take by mouth.    . hydrALAZINE (APRESOLINE) 50 MG tablet Take by mouth 3 (three) times daily.     . hydrochlorothiazide (HYDRODIURIL) 12.5 MG tablet Take by mouth.    Marland Kitchen lisinopril (PRINIVIL,ZESTRIL) 40 MG tablet Take by mouth.    . meloxicam (MOBIC) 15 MG tablet Take by mouth.    . metFORMIN (GLUCOPHAGE-XR) 500 MG 24 hr tablet Take by mouth.    . naproxen sodium (ALEVE) 220 MG tablet Take by  mouth.    . pantoprazole (PROTONIX) 40 MG tablet Take by mouth.     No current facility-administered medications for this visit.      Musculoskeletal: Strength & Muscle Tone: UTA Gait & Station: UTA Patient leans: N/A  Psychiatric Specialty Exam: Review of Systems  Psychiatric/Behavioral: Positive for depression.  All other systems reviewed and are negative.   There were no vitals taken for this visit.There is no height or weight on file to calculate BMI.  General Appearance: UTA  Eye Contact:  UTA  Speech:  Normal Rate  Volume:  Normal  Mood:  Dysphoric  Affect:  UTA  Thought Process:  Goal Directed and Descriptions of Associations:  Intact  Orientation:  Full (Time, Place, and Person)  Thought Content: Logical   Suicidal Thoughts:  No  Homicidal Thoughts:  No  Memory:  Immediate;   Fair Recent;   Fair Remote;   Fair  Judgement:  Fair  Insight:  Fair  Psychomotor Activity:  UTA  Concentration:  Concentration: Fair and Attention Span: Fair  Recall:  [employer] of Knowledge: Fair  Language: Fair  Akathisia:  No  Handed:  Right  AIMS (if indicated): Denies tremors, rigidity  Assets:  Communication Skills Desire for Improvement Housing Talents/Skills Transportation  ADL's:  Intact  Cognition: WNL  Sleep:  Fair   Screenings:   Assessment and Plan: Jean Stout is a 78 year old Caucasian female, retired, lives in [place], recently relocated from [place], has a history of MDD, GAD, hyperlipidemia, hypertension, diabetes melitis, coronary artery disease was evaluated by phone today.  Patient is biologically predisposed given her family history.  She also has psychosocial stressors including recent COVID-19 outbreak, social isolation, financial problem and recent relocation.  Patient will continue to benefit from medication readjustment.  Plan as noted below.  Plan MDD-unstable Increase Cymbalta to 60 mg p.o. daily Continue CBT with Ms. Felecia Jan.  Discussed with  her to reach out to Ms. Felecia Jan today.  For GAD- some progress Cymbalta as prescribed  Panic attacks-improving Continue CBT  For insomnia-patient today reports sleep is excessive.  We will continue to monitor closely. Discussed with her to restart using CPAP since she has been noncompliant.  Follow-up in clinic in 2 to 3 weeks or sooner if needed.  June 15 at 2:15 PM.  I have spent atleast 15 minutes non face to face with patient today. More than 50 % of the time was spent for psychoeducation and supportive psychotherapy and care coordination.  This note was generated in part or whole with voice recognition software. Voice recognition is usually quite accurate but there are transcription errors that can and very often do occur. I apologize for any typographical errors that were not detected and corrected.        Jomarie Longs, MD 08/21/2018, 5:50 PM

## 2018-09-10 ENCOUNTER — Ambulatory Visit (INDEPENDENT_AMBULATORY_CARE_PROVIDER_SITE_OTHER): Payer: Medicare Other | Admitting: Psychiatry

## 2018-09-10 ENCOUNTER — Other Ambulatory Visit: Payer: Self-pay

## 2018-09-10 DIAGNOSIS — Z5329 Procedure and treatment not carried out because of patient's decision for other reasons: Secondary | ICD-10-CM

## 2018-09-10 NOTE — Progress Notes (Signed)
Attempted to contact - left message.

## 2018-10-08 ENCOUNTER — Telehealth: Payer: Self-pay

## 2018-10-08 NOTE — Progress Notes (Signed)
Patient's Name: Jean Stout  MRN: [id number]  Referring Provider: Wayland Denis, PA-C  DOB: October 10, 1940  PCP: Wayland Denis, PA-C  DOS: 10/09/2018  Note by: Gillis Santa, MD  Service setting: Ambulatory outpatient  Specialty: Interventional Pain Management  Location: [facility] (AMB) Pain Management Facility  Visit type: Initial Patient Evaluation  Patient type: New Patient   Primary Reason(s) for Visit: Encounter for initial evaluation of one or more chronic problems (new to examiner) potentially causing chronic pain, and posing a threat to normal musculoskeletal function. (Level of risk: High) CC: Knee Pain (left), Foot Pain (right), Hand Pain (right), Shoulder Pain (left), and Joint Pain  HPI  [name] is a 78 y.o. year old, female patient, who comes today to see [place] for the first time for an initial evaluation of her chronic pain. She has Aortic stenosis, moderate; Coronary artery disease involving native coronary artery of native heart without angina pectoris; Depression; Essential hypertension; GERD without esophagitis; Hyperlipidemia, mixed; Iron deficiency anemia; OSA on CPAP; Type 2 diabetes mellitus without complication, without long-term current use of insulin ([place]); Osteoarthritis of multiple joints; Primary osteoarthritis of left knee; Localized primary osteoarthritis of shoulder regions, bilateral; Right wrist pain; Chronic pain of right ankle; Chronic pain syndrome; Polyarthralgia; and History of bilateral hip replacements on their problem list. Today she comes in for evaluation of her Knee Pain (left), Foot Pain (right), Hand Pain (right), Shoulder Pain (left), and Joint Pain  Pain Assessment: Location: Left Knee Radiating: denies Onset: More than a month ago Duration: Chronic pain Quality: Aching Severity: 8 /10 (subjective, self-reported pain score)  Note: Reported level is inconsistent with clinical observations.                         When using our objective Pain Scale,  levels between 6 and 10/10 are said to belong in an emergency room, as it progressively worsens from a 6/10, described as severely limiting, requiring emergency care not usually available at an outpatient pain management facility. At a 6/10 level, communication becomes difficult and requires great effort. Assistance to reach the emergency department may be required. Facial flushing and profuse sweating along with potentially dangerous increases in heart rate and blood pressure will be evident. Effect on ADL: Limits activities Timing: Constant Modifying factors: tramadol worked very well BP: 133/65  HR: 64  Onset and Duration: Gradual and Present longer than 3 months Cause of pain: Unknown Severity: Getting worse, NAS-11 at its worse: 8/10, NAS-11 at its best: 7/10 and NAS-11 on the average: 5/10 Timing: Not influenced by the time of the day, During activity or exercise and After activity or exercise Aggravating Factors: Prolonged sitting, Prolonged standing, Surgery made it worse, Walking and Walking downhill Alleviating Factors: Medications Associated Problems: Depression, Fatigue, Numbness, Spasms and Pain that does not allow patient to sleep Quality of Pain: Aching, Burning, Cruel, Distressing, Superficial, Uncomfortable and Work related Previous Examinations or Tests: MRI scan, Orthopedic evaluation and Psychiatric evaluation Previous Treatments: Pool exercises and TENS  The patient comes into the clinics today for the first time for a chronic pain management evaluation.   Patient is a pleasant 78 year old female who presents with a chief complaint of left knee pain, right forearm pain, left and right shoulder pain, right ankle pain.  Patient has a history of bilateral hip replacement 2007.  She is status post left knee arthroscopy in 2018 after a fall that she sustained in 2017.  She states that her knee pain  worsened after her left knee arthroscopy.  She has had left knee injections done in  [place] which she states were not effective.  She also endorses bilateral shoulder pain.  She has difficulty with shoulder abduction secondary to pain.  Patient is tried physical therapy in the past.  She was managed on tramadol in [place] 100 mg twice daily.  She would receive quantity 120/month.  She states that this medication would help her function and improve her pain.  Patient also sees psychiatry for her depression and anxiety.  Today I took the time to provide the patient with information regarding my pain practice. The patient was informed that my practice is divided into two sections: an interventional pain management section, as well as a completely separate and distinct medication management section. I explained that I have procedure days for my interventional therapies, and evaluation days for follow-ups and medication management. Because of the amount of documentation required during both, they are kept separated. This means that there is the possibility that she may be scheduled for a procedure on one day, and medication management the next. I have also informed her that because of staffing and facility limitations, I no longer take patients for medication management only. To illustrate the reasons for this, I gave the patient the example of surgeons, and how inappropriate it would be to refer a patient to his/her care, just to write for the post-surgical antibiotics on a surgery done by a different surgeon.   Because interventional pain management is my board-certified specialty, the patient was informed that joining my practice means that they are open to any and all interventional therapies. I made it clear that this does not mean that they will be forced to have any procedures done. What this means is that I believe interventional therapies to be essential part of the diagnosis and proper management of chronic pain conditions. Therefore, patients not interested in these  interventional alternatives will be better served under the care of a different practitioner.  The patient was also made aware of my Comprehensive Pain Management Safety Guidelines where by joining my practice, they limit all of their nerve blocks and joint injections to those done by our practice, for as long as we are retained to manage their care.   Historic Controlled Substance Pharmacotherapy Review   12/05/2017  1   09/21/2017  Tramadol Hcl 50 MG Tablet  120.00 30 Si [address]   [id number]   M.n ([id number])   0  20.00 MME  Medicare   [place]    Pharmacodynamics:  Desired effects: Analgesia: The patient reports >50% benefit. Reported improvement in function: The patient reports medication allows her to accomplish basic ADLs. Clinically meaningful improvement in function (CMIF): Sustained CMIF goals met Perceived effectiveness: Described as relatively effective, allowing for increase in activities of daily living (ADL) Undesirable effects: Side-effects or Adverse reactions: None reported Historical Monitoring: The patient  reports no history of drug use. List of all UDS Test(s): No results found for: MDMA, COCAINSCRNUR, [place], [place], CANNABQUANT, [place], [place] List of other Serum/Urine Drug Screening Test(s):  No results found for: AMPHSCRSER, BARBSCRSER, BENZOSCRSER, COCAINSCRSER, COCAINSCRNUR, PCPSCRSER, PCPQUANT, THCSCRSER, THCU, CANNABQUANT, OPIATESCRSER, OXYSCRSER, PROPOXSCRSER, ETH Historical Background Evaluation: [place] PMP: PDMP not reviewed this encounter. Six (6) year initial data search conducted.             [place] Department of public safety, offender search: Editor, commissioning Information) Non-contributory Risk Assessment Profile: Aberrant behavior: None observed or detected today Risk factors  for fatal opioid overdose: None identified today Fatal overdose hazard ratio (HR): Calculation deferred Non-fatal overdose hazard ratio (HR): Calculation deferred Risk of opioid abuse or dependence: 0.7-3.0%  with doses ? 36 MME/day and 6.1-26% with doses ? 120 MME/day. Substance use disorder (SUD) risk level: See below Personal History of Substance Abuse (SUD-Substance use disorder):  Alcohol:    Illegal Drugs:    Rx Drugs:    ORT Risk Level calculation:    ORT Scoring interpretation table:  Score <3 = Low Risk for SUD  Score between 4-7 = Moderate Risk for SUD  Score >8 = High Risk for Opioid Abuse   PHQ-2 Depression Scale:  Total score: 4  PHQ-2 Scoring interpretation table: (Score and probability of major depressive disorder)  Score 0 = No depression  Score 1 = 15.4% Probability  Score 2 = 21.1% Probability  Score 3 = 38.4% Probability  Score 4 = 45.5% Probability  Score 5 = 56.4% Probability  Score 6 = 78.6% Probability   PHQ-9 Depression Scale:  Total score: 18  PHQ-9 Scoring interpretation table:  Score 0-4 = No depression  Score 5-9 = Mild depression  Score 10-14 = Moderate depression  Score 15-19 = Moderately severe depression  Score 20-27 = Severe depression (2.4 times higher risk of SUD and 2.89 times higher risk of overuse)   Pharmacologic Plan: As per protocol, I have not taken over any controlled substance management, pending the results of ordered tests and/or consults.            Initial impression: Pending review of available data and ordered tests.  Meds   Current Outpatient Medications:  .  Acetaminophen 500 MG coapsule, Take by mouth., Disp: , Rfl:  .  amLODipine (NORVASC) 10 MG tablet, Take by mouth., Disp: , Rfl:  .  aspirin EC 81 MG tablet, Take by mouth., Disp: , Rfl:  .  atorvastatin (LIPITOR) 40 MG tablet, Take by mouth., Disp: , Rfl:  .  cetirizine (ZYRTEC) 10 MG tablet, Take by mouth., Disp: , Rfl:  .  hydrALAZINE (APRESOLINE) 50 MG tablet, Take by mouth 3 (three) times daily. , Disp: , Rfl:  .  hydrochlorothiazide (HYDRODIURIL) 12.5 MG tablet, Take by mouth., Disp: , Rfl:  .  lisinopril (PRINIVIL,ZESTRIL) 40 MG tablet, Take by mouth., Disp: , Rfl:   .  meloxicam (MOBIC) 15 MG tablet, Take by mouth., Disp: , Rfl:  .  metFORMIN (GLUCOPHAGE-XR) 500 MG 24 hr tablet, Take by mouth., Disp: , Rfl:  .  naproxen sodium (ALEVE) 220 MG tablet, Take by mouth., Disp: , Rfl:  .  pantoprazole (PROTONIX) 40 MG tablet, Take by mouth., Disp: , Rfl:  .  docusate sodium (COLACE) 100 MG capsule, Take by mouth., Disp: , Rfl:  .  ferrous sulfate 325 (65 FE) MG tablet, Take by mouth., Disp: , Rfl:    ROS  Cardiovascular: Abnormal heart rhythm, High blood pressure, Heart attack ( Date: 2010), Heart murmur and Heart valve problems Pulmonary or Respiratory: Snoring  and Temporary stoppage of breathing during sleep Neurological: Incontinence:  Urinary Review of Past Neurological Studies: No results found for this or any previous visit. Psychological-Psychiatric: Anxiousness, Depressed and Prone to panicking Gastrointestinal: Reflux or heatburn and Alternating episodes iof diarrhea and constipation (IBS-Irritable bowe syndrome) Genitourinary: No reported renal or genitourinary signs or symptoms such as difficulty voiding or producing urine, peeing blood, non-functioning kidney, kidney stones, difficulty emptying the bladder, difficulty controlling the flow of urine, or chronic kidney disease Hematological:  No reported hematological signs or symptoms such as prolonged bleeding, low or poor functioning platelets, bruising or bleeding easily, hereditary bleeding problems, low energy levels due to low hemoglobin or being anemic Endocrine: No reported endocrine signs or symptoms such as high or low blood sugar, rapid heart rate due to high thyroid levels, obesity or weight gain due to slow thyroid or thyroid disease Rheumatologic: Joint aches and or swelling due to excess weight (Osteoarthritis) Musculoskeletal: Negative for myasthenia gravis, muscular dystrophy, multiple sclerosis or malignant hyperthermia Work History: Retired  Allergies  Ms. Swoveland is allergic to  iodinated diagnostic agents; amlodipine besy-benazepril hcl; bextra [valdecoxib]; and gadolinium derivatives.   [place]  Drug: Ms. Deschler  reports no history of drug use. Alcohol:  reports previous alcohol use. Tobacco:  reports that she has never smoked. She has never used smokeless tobacco. Medical:  has a past medical history of Coronary artery disease, DM (diabetes mellitus) ([place]), HLD (hyperlipidemia), and HTN (hypertension). Family: family history includes Bipolar disorder in her sister.  Past Surgical History:  Procedure Laterality Date  . CAROTID STENT    . JOINT REPLACEMENT Bilateral 2007  . KNEE SURGERY Left 2018   Active Ambulatory Problems    Diagnosis Date Noted  . Aortic stenosis, moderate 03/13/2018  . Coronary artery disease involving native coronary artery of native heart without angina pectoris 03/01/2018  . Depression 03/01/2018  . Essential hypertension 03/01/2018  . GERD without esophagitis 03/01/2018  . Hyperlipidemia, mixed 03/01/2018  . Iron deficiency anemia 03/01/2018  . OSA on CPAP 03/01/2018  . Type 2 diabetes mellitus without complication, without long-term current use of insulin ([place]) 03/01/2018  . Osteoarthritis of multiple joints 03/01/2018  . Primary osteoarthritis of left knee 10/09/2018  . Localized primary osteoarthritis of shoulder regions, bilateral 10/09/2018  . Right wrist pain 10/09/2018  . Chronic pain of right ankle 10/09/2018  . Chronic pain syndrome 10/09/2018  . Polyarthralgia 10/09/2018  . History of bilateral hip replacements 10/09/2018   Resolved Ambulatory Problems    Diagnosis Date Noted  . No Resolved Ambulatory Problems   Past Medical History:  Diagnosis Date  . Coronary artery disease   . DM (diabetes mellitus) ([place])   . HLD (hyperlipidemia)   . HTN (hypertension)    Constitutional Exam  General appearance: Well nourished, well developed, and well hydrated. In no apparent acute distress Vitals:   10/09/18 1020  BP:  133/65  Pulse: 64  Resp: 18  Temp: 98.4 F (36.9 C)  SpO2: 98%  Weight: 178 lb (80.7 kg)  Height: '5\' 1"'  (1.549 m)   BMI Assessment: Estimated body mass index is 33.63 kg/m as calculated from the following:   Height as of this encounter: '5\' 1"'  (1.549 m).   Weight as of this encounter: 178 lb (80.7 kg).  BMI interpretation table: BMI level Category Range association with higher incidence of chronic pain  <18 kg/m2 Underweight   18.5-24.9 kg/m2 Ideal body weight   25-29.9 kg/m2 Overweight Increased incidence by 20%  30-34.9 kg/m2 Obese (Class I) Increased incidence by 68%  35-39.9 kg/m2 Severe obesity (Class II) Increased incidence by 136%  >40 kg/m2 Extreme obesity (Class III) Increased incidence by 254%   Patient's current BMI Ideal Body weight  Body mass index is 33.63 kg/m. Ideal body weight: 47.8 kg (105 lb 6.1 oz) Adjusted ideal body weight: 61 kg (134 lb 6.8 oz)   BMI Readings from Last 4 Encounters:  10/09/18 33.63 kg/m  06/19/18 30.53 kg/m  05/21/18 31.67 kg/m  04/30/18 32.09 kg/m   Wt Readings from Last 4 Encounters:  10/09/18 178 lb (80.7 kg)  06/19/18 171 lb 3.2 oz (77.7 kg)  05/21/18 177 lb 9.6 oz (80.6 kg)  04/30/18 180 lb (81.6 kg)  Psych/Mental status: Alert, oriented x 3 (person, place, & time)       Eyes: PERLA Respiratory: No evidence of acute respiratory distress  Cervical Spine Area Exam  Skin & Axial Inspection: No masses, redness, edema, swelling, or associated skin lesions Alignment: Symmetrical Functional ROM: Unrestricted ROM      Stability: No instability detected Muscle Tone/Strength: Functionally intact. No obvious neuro-muscular anomalies detected. Sensory (Neurological): Musculoskeletal pain pattern  Upper Extremity (UE) Exam    Side: Right upper extremity  Side: Left upper extremity  Skin & Extremity Inspection: Skin color, temperature, and hair growth are WNL. No peripheral edema or cyanosis. No masses, redness, swelling,  asymmetry, or associated skin lesions. No contractures.  Skin & Extremity Inspection: Skin color, temperature, and hair growth are WNL. No peripheral edema or cyanosis. No masses, redness, swelling, asymmetry, or associated skin lesions. No contractures.  Functional ROM: Decreased ROM for shoulder and elbow  Functional ROM: Decreased ROM for shoulder and elbow  Muscle Tone/Strength: Functionally intact. No obvious neuro-muscular anomalies detected.  Muscle Tone/Strength: Functionally intact. No obvious neuro-muscular anomalies detected.  Sensory (Neurological): Arthropathic arthralgia          Sensory (Neurological): Arthropathic arthralgia              Provocative Test(s):  Phalen's test: deferred Tinel's test: deferred Apley's scratch test (touch opposite shoulder):  Action 1 (Across chest): Decreased ROM Action 2 (Overhead): Decreased ROM Action 3 (LB reach): Decreased ROM   Provocative Test(s):  Phalen's test: deferred Tinel's test: deferred Apley's scratch test (touch opposite shoulder):  Action 1 (Across chest): Decreased ROM Action 2 (Overhead): Decreased ROM Action 3 (LB reach): Decreased ROM    Thoracic Spine Area Exam  Skin & Axial Inspection: No masses, redness, or swelling Alignment: Symmetrical Functional ROM: Unrestricted ROM Stability: No instability detected Muscle Tone/Strength: Functionally intact. No obvious neuro-muscular anomalies detected. Sensory (Neurological): Unimpaired Muscle strength & Tone: No palpable anomalies  Lumbar Spine Area Exam  Skin & Axial Inspection: No masses, redness, or swelling Alignment: Symmetrical Functional ROM: Unrestricted ROM       Stability: No instability detected Muscle Tone/Strength: Functionally intact. No obvious neuro-muscular anomalies detected. Sensory (Neurological): Unimpaired   Gait & Posture Assessment  Ambulation: Unassisted Gait: Relatively normal for age and body habitus Posture: WNL   Lower Extremity Exam     Side: Right lower extremity  Side: Left lower extremity  Stability: No instability observed          Stability: No instability observed          Skin & Extremity Inspection: Skin color, temperature, and hair growth are WNL. No peripheral edema or cyanosis. No masses, redness, swelling, asymmetry, or associated skin lesions. No contractures.  Skin & Extremity Inspection: Evidence of prior arthroplastic surgery  Functional ROM: Unrestricted ROM                  Functional ROM: Pain restricted ROM for knee joint          Muscle Tone/Strength: Functionally intact. No obvious neuro-muscular anomalies detected.  Muscle Tone/Strength: Functionally intact. No obvious neuro-muscular anomalies detected.  Sensory (Neurological): Unimpaired        Sensory (Neurological): Arthropathic arthralgia        DTR: Patellar: 1+:  trace Achilles: deferred today Plantar: deferred today  DTR: Patellar: 0: absent Achilles: 0: absent Plantar: deferred today  Palpation: No palpable anomalies  Palpation: No palpable anomalies   Assessment  Primary Diagnosis & Pertinent Problem List: The primary encounter diagnosis was Primary osteoarthritis of left knee. Diagnoses of Localized primary osteoarthritis of shoulder regions, bilateral, History of bilateral hip replacements, Right wrist pain, Chronic pain of right ankle, Chronic pain syndrome, and Polyarthralgia were also pertinent to this visit.  Visit Diagnosis (New problems to examiner): 1. Primary osteoarthritis of left knee   2. Localized primary osteoarthritis of shoulder regions, bilateral   3. History of bilateral hip replacements   4. Right wrist pain   5. Chronic pain of right ankle   6. Chronic pain syndrome   7. Polyarthralgia    Plan of Care (Initial workup plan)  Note: Ms. Mattera was reminded that as per protocol, today's visit has been an evaluation only. We have not taken over the patient's controlled substance management.  General Recommendations:  The pain condition that the patient suffers from is best treated with a multidisciplinary approach that involves an increase in physical activity to prevent de-conditioning and worsening of the pain cycle, as well as psychological counseling (formal and/or informal) to address the co-morbid psychological affects of pain. Treatment will often involve judicious use of pain medications and interventional procedures to decrease the pain, allowing the patient to participate in the physical activity that will ultimately produce long-lasting pain reductions. The goal of the multidisciplinary approach is to return the patient to a higher level of overall function and to restore their ability to perform activities of daily living.  Patient is a pleasant 78 year old female who presents with a chief complaint of left knee pain, right forearm pain, left and right shoulder pain, right ankle pain.  Patient has a history of bilateral hip replacement 2007.  She is status post left knee arthroscopy in 2018 after a fall that she sustained in 2017.  She states that her knee pain worsened after her left knee arthroscopy.  She has had left knee injections done in [place] which she states were not effective.  She also endorses bilateral shoulder pain.  She has difficulty with shoulder abduction secondary to pain.  Patient is tried physical therapy in the past.  She was managed on tramadol in [place] 100 mg twice daily.  She would receive quantity 120/month.  She states that this medication would help her function and improve her pain.  Patient also sees psychiatry for her depression and anxiety.  Discussed interventional options including left knee genicular nerve block followed by radiofrequency ablation.  Risks and benefits of this procedure reviewed and patient would like to proceed.  In regards to her bilateral shoulder osteoarthritis, discussed bilateral shoulder steroid joint injections as well as suprascapular  nerve blocks.  Can consider in future after left knee intervention.  Regards to medication management, we will have patient complete urine drug screen.  So long as this is appropriate, can consider taking patient over her previous dose of tramadol however at a reduced quantity of 90 as she has been off this medication for almost 10 months now so her tolerance has decreased.  Furthermore along with interventional options, she may utilize less tramadol than previously.   Lab Orders     Compliance Drug Analysis, Ur   Procedure Orders     GENICULAR NERVE BLOCK Pharmacotherapy (current): Medications ordered:  Meds ordered this encounter  Medications  .  orphenadrine (NORFLEX) injection 30 mg  . ketorolac (TORADOL) 30 MG/ML injection 30 mg   Medications administered during this visit: We administered orphenadrine and ketorolac.   Pharmacological management options:  Opioid Analgesics: The patient was informed that there is no guarantee that she would be a candidate for opioid analgesics. The decision will be made following CDC guidelines. This decision will be based on the results of diagnostic studies, as well as Ms. Kliethermes's risk profile.   Membrane stabilizer: Has tried gabapentin and Cymbalta in the past.  Can consider Lyrica  Muscle relaxant: To be determined at a later time  NSAID: Adequate regimen  Other analgesic(s): To be determined at a later time   Interventional management options: Ms. Reinhold was informed that there is no guarantee that she would be a candidate for interventional therapies. The decision will be based on the results of diagnostic studies, as well as Ms. Solana's risk profile.  Procedure(s) under consideration:  Left knee genicular nerve block, left knee genicular RFA Bilateral shoulder steroid injection Bilateral shoulder suprascapular nerve block Sacroiliac joint injection   Provider-requested follow-up: Return for Procedure Left knee genicular nerve block with  sedation ASAP.  Future Appointments  Date Time Provider [place]  10/17/2018  9:00 AM Gillis Santa, MD [facility] None    Primary Care Physician: Wayland Denis, PA-C Location: [facility] Note by: Gillis Santa, MD Date: 10/09/2018; Time: 11:44 AM  Note: This dictation was prepared with Dragon dictation. Any transcriptional errors that may result from this process are unintentional.

## 2018-10-08 NOTE — Telephone Encounter (Signed)
Attempted to call patient. No answer. Left message to call us back to get some information for tomorrows visit.

## 2018-10-09 ENCOUNTER — Encounter: Payer: Self-pay | Admitting: Student in an Organized Health Care Education/Training Program

## 2018-10-09 ENCOUNTER — Ambulatory Visit
Payer: Medicare Other | Attending: Student in an Organized Health Care Education/Training Program | Admitting: Student in an Organized Health Care Education/Training Program

## 2018-10-09 ENCOUNTER — Other Ambulatory Visit: Payer: Self-pay

## 2018-10-09 VITALS — BP 133/65 | HR 64 | Temp 98.4°F | Resp 18 | Ht 61.0 in | Wt 178.0 lb

## 2018-10-09 DIAGNOSIS — G8929 Other chronic pain: Secondary | ICD-10-CM | POA: Diagnosis present

## 2018-10-09 DIAGNOSIS — M19011 Primary osteoarthritis, right shoulder: Secondary | ICD-10-CM

## 2018-10-09 DIAGNOSIS — M19012 Primary osteoarthritis, left shoulder: Secondary | ICD-10-CM | POA: Insufficient documentation

## 2018-10-09 DIAGNOSIS — M1712 Unilateral primary osteoarthritis, left knee: Secondary | ICD-10-CM | POA: Diagnosis not present

## 2018-10-09 DIAGNOSIS — G894 Chronic pain syndrome: Secondary | ICD-10-CM | POA: Diagnosis present

## 2018-10-09 DIAGNOSIS — Z96643 Presence of artificial hip joint, bilateral: Secondary | ICD-10-CM

## 2018-10-09 DIAGNOSIS — M2550 Pain in unspecified joint: Secondary | ICD-10-CM

## 2018-10-09 DIAGNOSIS — M25531 Pain in right wrist: Secondary | ICD-10-CM

## 2018-10-09 DIAGNOSIS — M25571 Pain in right ankle and joints of right foot: Secondary | ICD-10-CM | POA: Insufficient documentation

## 2018-10-09 MED ORDER — KETOROLAC TROMETHAMINE 30 MG/ML IJ SOLN
30.0000 mg | Freq: Once | INTRAMUSCULAR | Status: AC
  Administered 2018-10-09: 30 mg via INTRAMUSCULAR

## 2018-10-09 MED ORDER — ORPHENADRINE CITRATE 30 MG/ML IJ SOLN
INTRAMUSCULAR | Status: AC
  Filled 2018-10-09: qty 2

## 2018-10-09 MED ORDER — KETOROLAC TROMETHAMINE 30 MG/ML IJ SOLN
INTRAMUSCULAR | Status: AC
  Filled 2018-10-09: qty 1

## 2018-10-09 MED ORDER — ORPHENADRINE CITRATE 30 MG/ML IJ SOLN
30.0000 mg | Freq: Once | INTRAMUSCULAR | Status: AC
  Administered 2018-10-09: 30 mg via INTRAMUSCULAR

## 2018-10-09 NOTE — Patient Instructions (Signed)
Genicular Nerve Block  What is a genicular nerve block? A genicular nerve block is the injection of a local anesthetic to block the nerves that transmits pain from the knee.  What is the purpose of a facet nerve block? A genicular nerve block is a diagnostic procedure to determine if the pathologic changes (i.e. arthritis, meniscal tears, etc) and inflammation within the knee joint is the source of your knee pain. It also confirms that the knee pain will respond well to the actual treatment procedure. If a genicular nerve block works, it will give you relief for several hours. After that, the pain is expected to return to normal. This test is always performed twice (usually a week or two apart) because two successful tests are required to move onto treatment. If both diagnostic tests are positive, then we schedule a treatment called radiofrequency (RF) ablation. In this procedure, the same nerves are cauterized, which typically leads to pain relief for 4 -18 months. If this process works well for one knee, it can be performed on the other knee if needed.  How is the procedure performed? You will be placed on the procedure table. The injection site is sterilized with either iodine or chlorhexadine. The site to be injected is numbed with a local anesthetic, and a needle is directed to the target area. X-ray guidance is used to ensure proper placement and positioning of the needle. When the needle is properly positioned near the genicular nerve, local anesthetic is injected to numb that nerve. This will be repeated at multiple sites around the knee to block all genicular nerves.  Will the procedure be painful? The injection can be painful and we therefore provide the option of receiving IV sedation. IV sedation, combined with local anesthetic, can make the injection nearly pain free. It allows you to remain very still during the procedure, which can also make the injection easier, faster, and more  successful. If you decide to have IV sedation, you must have a driver to get you home safely afterwards. In addition, you cannot have anything to eat or drink within 8 hours of your appointment (clear liquids are allowed until 3 hours before the procedure). If you take medications for diabetes, these medications may need to be adjusted the morning of the procedure. Your primary care physician can help you with this adjustment.  What are the discharge instructions? If you received IV sedation do not drive or operate machinery for at least 24 hours after the procedure. You may return to work the next day following your procedure. You may resume your normal diet immediately. Do not engage in any strenuous activity for 24 hours. You should, however, engage in moderate activity that typically causes your ususal pain. If the block works, those activities should not be painful for several hours after the injection. Do not take a bath, swim, or use a hot tub for 24 hours (you may take a shower). Call the office if you have any of the following: severe pain afterwards (different than your usual symptoms), redness/swelling/discharge at the injection site(s), fevers/chills, difficulty with bowel or bladder functions.  What are the risks and side effects? The complication rate for this procedure is very low. Whenever a needle enters the skin, bleeding or infection can occur. Some other serious but extremely rare risks include paralysis and death. You may have an allergic reaction to any of the medications used. If you have a known allergy to any medications, especially local anesthetics, notify our staff  before the procedure takes place. You may experience any of the following side effects up to 4 - 6 hours after the procedure: . Leg muscle weakness or numbness may occur due to the local anesthetic affecting the nerves that control your legs (this is a temporary affect and it is not paralysis). If you have any leg  weakness or numbness, walk only with assistance in order to prevent falls and injury. Your leg strength will return slowly and completely. . Dizziness may occur due to a decrease in your blood pressure. If this occurs, remain in a seated or lying position. Gradually sit up, and then stand after at least 10 minutes of sitting. . Mild headaches may occur. Drink fluids and take pain medications if needed. If the headaches persist or become severe, call the office. . Mild discomfort at the injection site can occur. This typically lasts for a few hours but can persist for a couple days. If this occurs, take anti-inflammatories or pain medications, apply ice to the area the day of the procedure. If it persists, apply moist heat in the day(s) following.  The side effects listed above can be normal. They are not dangerous and will resolve on their own. If, however, you experience any of the following, a complication may have occurred and you should either contact your doctor. If he is not readily available, then you should proceed to the closest urgent care center for evaluation: . Severe or progressive pain at the injection site(s) . Arm or leg weakness that progressively worsens or persists for longer than 8 hours . Severe or progressive redness, swelling, or discharge from the injections site(s) . Fevers, chills, nausea, or vomiting . Bowel or bladder dysfunction (i.e. inability to urinate or pass stool or difficulty controlling either)  How long does it take for the procedure to work? You should feel relief from your usual pain within the first hour. Again, this is only expected to last for several hours, at the most. Remember, you may be sore in the middle part of your back from the needles, and you must distinguish this from your usual pain. Preparing for your procedure (without sedation) Instructions: . Oral Intake: Do not eat or drink anything for at least 3 hours prior to your  procedure. . Transportation: Unless otherwise stated by your physician, you may drive yourself after the procedure. . Blood Pressure Medicine: Take your blood pressure medicine with a sip of water the morning of the procedure. . Insulin: Take only  of your normal insulin dose. . Preventing infections: Shower with an antibacterial soap the morning of your procedure. . Build-up your immune system: Take 1000 mg of Vitamin C with every meal (3 times a day) the day prior to your procedure. . Pregnancy: If you are pregnant, call and cancel the procedure. . Sickness: If you have a cold, fever, or any active infections, call and cancel the procedure. . Arrival: You must be in the facility at least 30 minutes prior to your scheduled procedure. . Children: Do not bring any children with you. . Dress appropriately: Bring dark clothing that you would not mind if they get stained. . Valuables: Do not bring any jewelry or valuables. Procedure appointments are reserved for interventional treatments only. [facility]. No Prescription Refills. . No medication changes will be discussed during procedure appointments. . No disability issues will be discussed. Preparing for Procedure with Sedation Instructions: Oral Intake: Do not eat or drink anything for at least 8 hours prior to  your procedure. Transportation: Public transportation is not allowed. Bring an adult driver. The driver must be physically present in our waiting room before any procedure can be started. Physical Assistance: Bring an adult capable of physically assisting you, in the event you need help. Blood Pressure Medicine: Take your blood pressure medicine with a sip of water the morning of the procedure. Insulin: Take only  of your normal insulin dose. Preventing infections: Shower with an antibacterial soap the morning of your procedure. Build-up your immune system: Take 1000 mg of Vitamin C with every meal (3 times a day) the day prior to your  procedure. Pregnancy: If you are pregnant, call and cancel the procedure. Sickness: If you have a cold, fever, or any active infections, call and cancel the procedure. Arrival: You must be in the facility at least 30 minutes prior to your scheduled procedure. Children: Do not bring children with you. Dress appropriately: Bring dark clothing that you would not mind if they get stained. Valuables: Do not bring any jewelry or valuables. Procedure appointments are reserved for interventional treatments only. No Prescription Refills. No medication changes will be discussed during procedure appointments. No disability issues will be discussed.

## 2018-10-09 NOTE — Progress Notes (Signed)
Safety precautions to be maintained throughout the outpatient stay will include: orient to surroundings, keep bed in low position, maintain call bell within reach at all times, provide assistance with transfer out of bed and ambulation.  

## 2018-10-11 LAB — COMPLIANCE DRUG ANALYSIS, UR

## 2018-10-12 ENCOUNTER — Other Ambulatory Visit: Payer: Medicare Other

## 2018-10-17 ENCOUNTER — Ambulatory Visit (HOSPITAL_BASED_OUTPATIENT_CLINIC_OR_DEPARTMENT_OTHER): Payer: Medicare Other | Admitting: Student in an Organized Health Care Education/Training Program

## 2018-10-17 ENCOUNTER — Encounter: Payer: Self-pay | Admitting: Student in an Organized Health Care Education/Training Program

## 2018-10-17 ENCOUNTER — Other Ambulatory Visit: Payer: Self-pay

## 2018-10-17 ENCOUNTER — Ambulatory Visit
Admission: RE | Admit: 2018-10-17 | Discharge: 2018-10-17 | Disposition: A | Discharge status: Home / Self Care | Payer: Medicare Other | Source: Ambulatory Visit | Attending: Student in an Organized Health Care Education/Training Program | Admitting: Student in an Organized Health Care Education/Training Program

## 2018-10-17 VITALS — BP 120/80 | HR 55 | Temp 98.1°F | Resp 15 | Ht 60.0 in | Wt 178.0 lb

## 2018-10-17 DIAGNOSIS — M1712 Unilateral primary osteoarthritis, left knee: Secondary | ICD-10-CM

## 2018-10-17 DIAGNOSIS — G894 Chronic pain syndrome: Secondary | ICD-10-CM

## 2018-10-17 MED ORDER — FENTANYL CITRATE (PF) 100 MCG/2ML IJ SOLN
25.0000 ug | INTRAMUSCULAR | Status: DC | PRN
  Administered 2018-10-17: 50 ug via INTRAVENOUS
  Filled 2018-10-17: qty 2

## 2018-10-17 MED ORDER — ROPIVACAINE HCL 2 MG/ML IJ SOLN
1.0000 mL | Freq: Once | INTRAMUSCULAR | Status: DC
  Filled 2018-10-17: qty 10

## 2018-10-17 MED ORDER — LIDOCAINE HCL 2 % IJ SOLN
20.0000 mL | Freq: Once | INTRAMUSCULAR | Status: AC
  Administered 2018-10-17: 400 mg
  Filled 2018-10-17: qty 20

## 2018-10-17 MED ORDER — DEXAMETHASONE SODIUM PHOSPHATE 10 MG/ML IJ SOLN
10.0000 mg | Freq: Once | INTRAMUSCULAR | Status: AC
  Administered 2018-10-17: 10 mg
  Filled 2018-10-17: qty 1

## 2018-10-17 MED ORDER — TRAMADOL HCL 50 MG PO TABS: 50.0000 mg | ORAL_TABLET | Freq: Four times a day (QID) | ORAL | 0 refills | Status: DC | PRN

## 2018-10-17 NOTE — Patient Instructions (Signed)
A prescription for Tramadol has been sent to your pharmacy. ____________________________________________________________________________________________  Post-procedure Information What to expect: Most procedures involve the use of a local anesthetic (numbing medicine), and a steroid (anti-inflammatory medicine).  The local anesthetics may cause temporary numbness and weakness of the legs or arms, depending on the location of the block. This numbness/weakness may last 4-6 hours, depending on the local anesthetic used. In rare instances, it can last up to 24 hours. While numb, you must be very careful not to injure the extremity.  After any procedure, you could expect the pain to get better within 15-20 minutes. This relief is temporary and may last 4-6 hours. Once the local anesthetics wears off, you could experience discomfort, possibly more than usual, for up to 10 (ten) days. In the case of radiofrequencies, it may last up to 6 weeks. Surgeries may take up to 8 weeks for the healing process. The discomfort is due to the irritation caused by needles going through skin and muscle. To minimize the discomfort, we recommend using ice the first day, and heat from then on. The ice should be applied for 15 minutes on, and 15 minutes off. Keep repeating this cycle until bedtime. Avoid applying the ice directly to the skin, to prevent frostbite. Heat should be used daily, until the pain improves (4-10 days). Be careful not to burn yourself.  Occasionally you may experience muscle spasms or cramps. These occur as a consequence of the irritation caused by the needle sticks to the muscle and the blood that will inevitably be lost into the surrounding muscle tissue. Blood tends to be very irritating to tissues, which tend to react by going into spasm. These spasms may start the same day of your procedure, but they may also take days to develop. This late onset type of spasm or cramp is usually caused by electrolyte  imbalances triggered by the steroids, at the level of the kidney. Cramps and spasms tend to respond well to muscle relaxants, multivitamins (some are triggered by the procedure, but may have their origins in vitamin deficiencies), and "Gatorade", or any sports drinks that can replenish any electrolyte imbalances. (If you are a diabetic, ask your pharmacist to get you a sugar-free brand.) Warm showers or baths may also be helpful. Stretching exercises are highly recommended.  General Instructions:  Be alert for signs of possible infection: redness, swelling, heat, red streaks, elevated temperature, and/or fever. These typically appear 4 to 6 days after the procedure. Immediately notify your doctor if you experience unusual bleeding, difficulty breathing, or loss of bowel or bladder control. If you experience increased pain, do not increase your pain medicine intake, unless instructed by your pain physician.  Post-Procedure Care:  Be careful in moving about. Muscle spasms in the area of the injection may occur. Applying ice or heat to the area is often helpful. The incidence of spinal headaches after epidural injections ranges between 1.4% and 6%. If you develop a headache that does not seem to respond to conservative therapy, please let your physician know. This can be treated with an epidural blood patch.   Post-procedure numbness or redness is to be expected, however it should average 4 to 6 hours. If numbness and weakness of your extremities begins to develop 4 to 6 hours after your procedure, and is felt to be progressing and worsening, immediately contact your physician.  Diet:  If you experience nausea, do not eat until this sensation goes away. If you had a "Stellate Ganglion Block"  for upper extremity "Reflex Sympathetic Dystrophy", do not eat or drink until your hoarseness goes away. In any case, always start with liquids first and if you tolerate them well, then slowly progress to more solid  foods.  Activity:  For the first 4 to 6 hours after the procedure, use caution in moving about as you may experience numbness and/or weakness. Use caution in cooking, using household electrical appliances, and climbing steps. If you need to reach your Doctor call our office: [phone] (During business hours) or [phone] (After business hours).  Business Hours: Monday-Thursday 8:00 am - 4:00 PM    Fridays: Closed     In case of an emergency: In case of emergency, call 911 or go to the nearest emergency room and have the physician there call us.  Interpretation of Procedure Every nerve block has two components: a diagnostic component, and a treatment component. Unrealistic expectations are the most common causes of "perceived failure".  In a perfect world, a single nerve block should be able to completely and permanently eliminate the pain. Sadly, the world is not perfect.  Most pain management nerve blocks are performed using local anesthetics and steroids. Steroids are responsible for any long-term benefit that you may experience. Their purpose is to decrease any chronic swelling that may exist in the area. Steroids begin to work immediately after being injected. However, most patients will not experience any benefits until 5 to 10 days after the injection, when the swelling has come down to the point where they can tell a difference. Steroids will only help if there is swelling to be treated. As such, they can assist with the diagnosis. If effective, they suggest an inflammatory component to the pain, and if ineffective, they rule out inflammation as the main cause or component of the problem. If the problem is one of mechanical compression, you will get no benefit from those steroids.   In the case of local anesthetics, they have a crucial role in the diagnosis of your condition. Most will begin to work within15 to 20 minutes after injection. The duration will depend on the type used  (short- vs. Long-acting). It is of outmost importance that patients keep tract of their pain, after the procedure. To assist with this matter, a "Post-procedure Pain Diary" is provided. Make sure to complete it and to bring it back to your follow-up appointment.  As long as the patient keeps accurate, detailed records of their symptoms after every procedure, and returns to have those interpreted, every procedure will provide [place] with invaluable information. Even a block that does not provide the patient with any relief, will always provide [place] with information about the mechanism and the origin of the pain. The only time a nerve block can be considered a waste of time is when patients do not keep track of the results, or do not keep their post-procedure appointment.  Reporting the results back to your physician The Pain Score  Pain is a subjective complaint. It cannot be seen, touched, or measured. We depend entirely on the patient's report of the pain in order to assess your condition and treatment. To evaluate the pain, we use a pain scale, where "0" means "No Pain", and a "10" is "the worst possible pain that you can even imagine" (i.e. something like been eaten alive by a shark or being torn apart by a lion).   Use the Pain Scale provided. You will frequently be asked to rate your pain. Please be  accurate, remember that medical decisions will be based on your responses. Please do not rate your pain above a 10. Doing so is actually interpreted as "symptom magnification" (exaggeration). To put this into perspective, when you tell us that your pain is at a 10 (ten), what you are saying is that there is nothing we can do to make this pain any worse. (Carefully think about that.) ____________________________________________________________________________________________    

## 2018-10-17 NOTE — Progress Notes (Signed)
Patient's Name: Jean Stout  MRN: [id number]  Referring Provider: Gillis Santa, MD  DOB: 1940/04/14  PCP: Wayland Denis, PA-C  DOS: 10/17/2018  Note by: Gillis Santa, MD  Service setting: Ambulatory outpatient  Specialty: Interventional Pain Management  Patient type: Established  Location: [facility] (AMB) Pain Management Facility  Visit type: Interventional Procedure   Primary Reason for Visit: Interventional Pain Management Treatment. CC: Knee Pain (left ) and Generalized Body Aches (fibromyalgia )  Procedure:          Anesthesia, Analgesia, Anxiolysis:  Type: Genicular Nerves Block (Superior-lateral, Superior-medial, and Inferior-medial Genicular Nerves) #1  CPT: 82423      Primary Purpose: Diagnostic Region: Lateral, Anterior, and Medial aspects of the knee joint, above and below the knee joint proper. Level: Superior and inferior to the knee joint. Target Area: For Genicular Nerve block(s), the targets are: the superior-lateral genicular nerve, located in the lateral distal portion of the femoral shaft as it curves to form the lateral epicondyle, in the region of the distal femoral metaphysis; the superior-medial genicular nerve, located in the medial distal portion of the femoral shaft as it curves to form the medial epicondyle; and the inferior-medial genicular nerve, located in the medial, proximal portion of the tibial shaft, as it curves to form the medial epicondyle, in the region of the proximal tibial metaphysis. Approach: Anterior, percutaneous, ipsilateral approach. Laterality: Left knee  Type: Moderate (Conscious) Sedation combined with Local Anesthesia Indication(s): Analgesia and Anxiety Route: Intravenous (IV) IV Access: Secured Sedation: Meaningful verbal contact was maintained at all times during the procedure  Local Anesthetic: Lidocaine 1-2%  Position: Modified Fowler's position with pillows under the targeted knee(s).   Indications: 1. Chronic pain syndrome   2.  Primary osteoarthritis of left knee    Pain Score: Pre-procedure: 8 /10 Post-procedure: 0-No pain/10   Pre-op Assessment:  Jean Stout is a 78 y.o. (year old), female patient, seen today for interventional treatment. She  has a past surgical history that includes Carotid stent; Joint replacement (Bilateral, 2007); and Knee surgery (Left, 2018). Jean Stout has a current medication list which includes the following prescription(s): amlodipine, aspirin ec, atorvastatin, cetirizine, cyclobenzaprine, duloxetine, hydralazine, hydrochlorothiazide, lisinopril, metformin, pantoprazole, acetaminophen, docusate sodium, ferrous sulfate, meloxicam, naproxen sodium, and tramadol, and the following Facility-Administered Medications: fentanyl and ropivacaine (pf) 2 mg/ml (0.2%). Her primarily concern today is the Knee Pain (left ) and Generalized Body Aches (fibromyalgia )  Initial Vital Signs:  Pulse/HCG Rate: (!) 55ECG Heart Rate: (!) 58 Temp: 98.1 F (36.7 C) Resp: 16 BP: 115/75 SpO2: 99 %  BMI: Estimated body mass index is 34.76 kg/m as calculated from the following:   Height as of this encounter: 5' (1.524 m).   Weight as of this encounter: 178 lb (80.7 kg).  Risk Assessment: Allergies: Reviewed. She is allergic to iodinated diagnostic agents; amlodipine besy-benazepril hcl; bextra [valdecoxib]; and gadolinium derivatives.  Allergy Precautions: None required Coagulopathies: Reviewed. None identified.  Blood-thinner therapy: None at this time Active Infection(s): Reviewed. None identified. Jean Stout is afebrile  Site Confirmation: Jean Stout was asked to confirm the procedure and laterality before marking the site Procedure checklist: Completed Consent: Before the procedure and under the influence of no sedative(s), amnesic(s), or anxiolytics, the patient was informed of the treatment options, risks and possible complications. To fulfill our ethical and legal obligations, as recommended by the  American Medical Association's Code of Ethics, I have informed the patient of my clinical impression; the nature and purpose of the treatment  or procedure; the risks, benefits, and possible complications of the intervention; the alternatives, including doing nothing; the risk(s) and benefit(s) of the alternative treatment(s) or procedure(s); and the risk(s) and benefit(s) of doing nothing. The patient was provided information about the general risks and possible complications associated with the procedure. These may include, but are not limited to: failure to achieve desired goals, infection, bleeding, organ or nerve damage, allergic reactions, paralysis, and death. In addition, the patient was informed of those risks and complications associated to the procedure, such as failure to decrease pain; infection; bleeding; organ or nerve damage with subsequent damage to sensory, motor, and/or autonomic systems, resulting in permanent pain, numbness, and/or weakness of one or several areas of the body; allergic reactions; (i.e.: anaphylactic reaction); and/or death. Furthermore, the patient was informed of those risks and complications associated with the medications. These include, but are not limited to: allergic reactions (i.e.: anaphylactic or anaphylactoid reaction(s)); adrenal axis suppression; blood sugar elevation that in diabetics may result in ketoacidosis or comma; water retention that in patients with history of congestive heart failure may result in shortness of breath, pulmonary edema, and decompensation with resultant heart failure; weight gain; swelling or edema; medication-induced neural toxicity; particulate matter embolism and blood vessel occlusion with resultant organ, and/or nervous system infarction; and/or aseptic necrosis of one or more joints. Finally, the patient was informed that Medicine is not an exact science; therefore, there is also the possibility of unforeseen or unpredictable risks  and/or possible complications that may result in a catastrophic outcome. The patient indicated having understood very clearly. We have given the patient no guarantees and we have made no promises. Enough time was given to the patient to ask questions, all of which were answered to the patient's satisfaction. Ms. Ezequiel KayserDrury has indicated that she wanted to continue with the procedure. Attestation: I, the ordering provider, attest that I have discussed with the patient the benefits, risks, side-effects, alternatives, likelihood of achieving goals, and potential problems during recovery for the procedure that I have provided informed consent. Date  Time: 10/17/2018  8:31 AM  Pre-Procedure Preparation:  Monitoring: As per clinic protocol. Respiration, ETCO2, SpO2, BP, heart rate and rhythm monitor placed and checked for adequate function Safety Precautions: Patient was assessed for positional comfort and pressure points before starting the procedure. Time-out: I initiated and conducted the "Time-out" before starting the procedure, as per protocol. The patient was asked to participate by confirming the accuracy of the "Time Out" information. Verification of the correct person, site, and procedure were performed and confirmed by me, the nursing staff, and the patient. "Time-out" conducted as per Joint Commission's Universal Protocol (UP.01.01.01). Time: 0949  Description of Procedure:          Area Prepped: Entire knee area, from mid-thigh to mid-shin, lateral, anterior, and medial aspects. Prepping solution: DuraPrep (Iodine Povacrylex [0.7% available iodine] and Isopropyl Alcohol, 74% w/w) Safety Precautions: Aspiration looking for blood return was conducted prior to all injections. At no point did we inject any substances, as a needle was being advanced. No attempts were made at seeking any paresthesias. Safe injection practices and needle disposal techniques used. Medications properly checked for expiration  dates. SDV (single dose vial) medications used. Description of the Procedure: Protocol guidelines were followed. The patient was placed in position over the procedure table. The target area was identified and the area prepped in the usual manner. Skin & deeper tissues infiltrated with local anesthetic. Appropriate amount of time allowed to pass for  local anesthetics to take effect. The procedure needles were then advanced to the target area. Proper needle placement secured. Negative aspiration confirmed. Solution injected in intermittent fashion, asking for systemic symptoms every 0.5cc of injectate. The needles were then removed and the area cleansed, making sure to leave some of the prepping solution back to take advantage of its long term bactericidal properties.  Vitals:   10/17/18 1001 10/17/18 1010 10/17/18 1020 10/17/18 1031  BP: 129/72 (!) 160/60 114/88 120/80  Pulse:      Resp: 20 17 14 15   Temp:  97.8 F (36.6 C)  98.1 F (36.7 C)  TempSrc:      SpO2: 100% 100% 100% 100%  Weight:      Height:        Start Time: 0949 hrs. End Time: 1002 hrs. Materials:  Needle(s) Type: Spinal Needle Gauge: 22G Length: 3.5-in Medication(s): Please see orders for medications and dosing details. 5 cc solution made of 4 cc of 0.2% ropivacaine, 1 cc of Decadron 10 mg/cc.  1.5 cc injected at each level above for the left knee. Imaging Guidance (Non-Spinal):          Type of Imaging Technique: Fluoroscopy Guidance (Non-Spinal) Indication(s): Assistance in needle guidance and placement for procedures requiring needle placement in or near specific anatomical locations not easily accessible without such assistance. Exposure Time: Please see nurses notes. Contrast: None used. Fluoroscopic Guidance: I was personally present during the use of fluoroscopy. "Tunnel Vision Technique" used to obtain the best possible view of the target area. Parallax error corrected before commencing the procedure.  "Direction-depth-direction" technique used to introduce the needle under continuous pulsed fluoroscopy. Once target was reached, antero-posterior, oblique, and lateral fluoroscopic projection used confirm needle placement in all planes. Images permanently stored in EMR. Interpretation: No contrast injected. I personally interpreted the imaging intraoperatively. Adequate needle placement confirmed in multiple planes. Permanent images saved into the patient's record.  Antibiotic Prophylaxis:   Anti-infectives (From admission, onward)   None     Indication(s): None identified  Post-operative Assessment:  Post-procedure Vital Signs:  Pulse/HCG Rate: (!) 55(!) 54 Temp: 98.1 F (36.7 C) Resp: 15 BP: 120/80 SpO2: 100 %  EBL: None  Complications: No immediate post-treatment complications observed by team, or reported by patient.  Note: The patient tolerated the entire procedure well. A repeat set of vitals were taken after the procedure and the patient was kept under observation following institutional policy, for this type of procedure. Post-procedural neurological assessment was performed, showing return to baseline, prior to discharge. The patient was provided with post-procedure discharge instructions, including a section on how to identify potential problems. Should any problems arise concerning this procedure, the patient was given instructions to immediately contact us, at any time, without hesitation. In any case, we plan to contact the patient by telephone for a follow-up status report regarding this interventional procedure.  Comments:  No additional relevant information.  [place] PMP checked and appropriate.  We will review UDS since it was positive for morphine however patient denies any morphine intake either prescribed or illicit.  She states that she does eat poppyseed bagels daily which could be a cause of the false positive.  Patient seems to be low risk for opioid misuse abuse.  Will  repeat UDS today.  Will prescribe tramadol as below for 2 weeks.  Plan of Care  Orders:  Orders Placed This Encounter  Procedures  . [facility] PA[facility] C-ARM 1-60 MIN NO REPORT    Intraoperative  interpretation by procedural physician at [facility].    Standing Status:   Standing    Number of Occurrences:   1    Order Specific Question:   Reason for exam:    Answer:   Assistance in needle guidance and placement for procedures requiring needle placement in or near specific anatomical locations not easily accessible without such assistance.  . Compliance Drug Analysis, Ur    Volume: 30 ml(s). Minimum 3 ml of urine is needed. Document temperature of fresh sample. Indications: Long term (current) use of opiate analgesic (Z79.891) Test#: [id number] (Comprehensive Profile)   Medications ordered for procedure: Meds ordered this encounter  Medications  . lidocaine (XYLOCAINE) 2 % (with pres) injection 400 mg  . fentaNYL (SUBLIMAZE) injection 25-50 mcg    Make sure Narcan is available in the pyxis when using this medication. In the event of respiratory depression (RR< 8/min): Titrate NARCAN (naloxone) in increments of 0.1 to 0.2 mg IV at 2-3 minute intervals, until desired degree of reversal.  . ropivacaine (PF) 2 mg/mL (0.2%) (NAROPIN) injection 1 mL  . dexamethasone (DECADRON) injection 10 mg  . DISCONTD: traMADol (ULTRAM) 50 MG tablet    Sig: Take 1 tablet (50 mg total) by mouth every 6 (six) hours as needed for up to 15 days for severe pain.    Dispense:  60 tablet    Refill:  0    Fill one day early if pharmacy is closed on scheduled refill date. Do not fill until:  To last until:  . traMADol (ULTRAM) 50 MG tablet    Sig: Take 1 tablet (50 mg total) by mouth every 6 (six) hours as needed for up to 15 days for severe pain.    Dispense:  60 tablet    Refill:  0    Fill one day early if pharmacy is closed on scheduled refill date   Medications administered: We administered  lidocaine, fentaNYL, and dexamethasone.  See the medical record for exact dosing, route, and time of administration.  Follow-up plan:   Return in about 3 weeks (around 11/07/2018) for Medication Management, Post Procedure Evaluation.      Diagnostic left knee genicular nerve block 10/17/2018.   Recent Visits Date Type Provider Dept  10/09/18 Office Visit Edward JollyLateef, Jayme Cham, MD [facility]  Showing recent visits within past 90 days and meeting all other requirements   Today's Visits Date Type Provider Dept  10/17/18 Procedure visit Edward JollyLateef, Kennia Vanvorst, MD [facility]  Showing today's visits and meeting all other requirements   Future Appointments Date Type Provider Dept  11/08/18 Appointment Edward JollyLateef, Kohl Polinsky, MD [facility]  Showing future appointments within next 90 days and meeting all other requirements   Disposition: Discharge home  Discharge Date & Time: 10/17/2018; 1033 hrs.   Primary Care Physician: Carren Rangarter, Danielle, PA-C [facility] Note by: Edward JollyBilal Rollan Roger, MD Date: 10/17/2018; Time: 11:46 AM  Disclaimer:  Medicine is not an exact science. The only guarantee in medicine is that nothing is guaranteed. It is important to note that the decision to proceed with this intervention was based on the information collected from the patient. The Data and conclusions were drawn from the patient's questionnaire, the interview, and the physical examination. Because the information was provided in large part by the patient, it cannot be guaranteed that it has not been purposely or unconsciously manipulated. Every effort has been made to obtain as much relevant data as possible for this evaluation.  It is important to note that the conclusions that lead to this procedure are derived in large part from the available data. Always take into account that the treatment will also be dependent on availability of resources and existing treatment  guidelines, considered by other Pain Management Practitioners as being common knowledge and practice, at the time of the intervention. For Medico-Legal purposes, it is also important to point out that variation in procedural techniques and pharmacological choices are the acceptable norm. The indications, contraindications, technique, and results of the above procedure should only be interpreted and judged by a Board-Certified Interventional Pain Specialist with extensive familiarity and expertise in the same exact procedure and technique.

## 2018-10-17 NOTE — Progress Notes (Signed)
Safety precautions to be maintained throughout the outpatient stay will include: orient to surroundings, keep bed in low position, maintain call bell within reach at all times, provide assistance with transfer out of bed and ambulation.  

## 2018-10-18 ENCOUNTER — Telehealth: Payer: Self-pay

## 2018-10-18 NOTE — Telephone Encounter (Signed)
Called patient talked to pts mom, instructed to call if neede. Patient was unavailable.

## 2018-10-20 LAB — COMPLIANCE DRUG ANALYSIS, UR

## 2018-10-23 ENCOUNTER — Emergency Department
Admission: EM | Admit: 2018-10-23 | Discharge: 2018-10-23 | Disposition: A | Discharge status: Home / Self Care | Payer: Medicare Other | Attending: Emergency Medicine | Admitting: Emergency Medicine

## 2018-10-23 ENCOUNTER — Encounter: Payer: Self-pay | Admitting: Emergency Medicine

## 2018-10-23 ENCOUNTER — Emergency Department: Payer: Medicare Other

## 2018-10-23 ENCOUNTER — Other Ambulatory Visit: Payer: Self-pay

## 2018-10-23 DIAGNOSIS — M25561 Pain in right knee: Secondary | ICD-10-CM | POA: Diagnosis not present

## 2018-10-23 DIAGNOSIS — I2510 Atherosclerotic heart disease of native coronary artery without angina pectoris: Secondary | ICD-10-CM | POA: Diagnosis not present

## 2018-10-23 DIAGNOSIS — E119 Type 2 diabetes mellitus without complications: Secondary | ICD-10-CM | POA: Diagnosis not present

## 2018-10-23 DIAGNOSIS — Z7982 Long term (current) use of aspirin: Secondary | ICD-10-CM | POA: Diagnosis not present

## 2018-10-23 DIAGNOSIS — Z79899 Other long term (current) drug therapy: Secondary | ICD-10-CM | POA: Insufficient documentation

## 2018-10-23 DIAGNOSIS — I10 Essential (primary) hypertension: Secondary | ICD-10-CM | POA: Diagnosis not present

## 2018-10-23 DIAGNOSIS — Z7984 Long term (current) use of oral hypoglycemic drugs: Secondary | ICD-10-CM | POA: Diagnosis not present

## 2018-10-23 HISTORY — DX: Gastro-esophageal reflux disease without esophagitis: K21.9

## 2018-10-23 HISTORY — DX: Unspecified osteoarthritis, unspecified site: M19.90

## 2018-10-23 MED ORDER — KETOROLAC TROMETHAMINE 30 MG/ML IJ SOLN
30.0000 mg | Freq: Once | INTRAMUSCULAR | Status: AC
  Administered 2018-10-23: 30 mg via INTRAMUSCULAR
  Filled 2018-10-23: qty 1

## 2018-10-23 NOTE — ED Notes (Signed)
See triage note  Presents with right knee pain and swelling  States she has  osteoarthritis and has pain to both knees    States she had a nerve block for left knee    States she recently started going to pain clinic

## 2018-10-23 NOTE — ED Triage Notes (Addendum)
C/O right knee pain and swelling.  States has history of fibromyalgia and osteoarthritis, bu noticed welling this morning after she woke

## 2018-10-23 NOTE — Discharge Instructions (Signed)
Ice and elevate your leg about 20 minute per hour for the next few days.  If not improving over the week, see orthopedics.  Return to the ER for symptoms that change or worsen if unable to see the specialist or your primary care provider.

## 2018-10-23 NOTE — ED Provider Notes (Signed)
[facility] Emergency Department Provider Note ____________________________________________  Time seen: Approximately 10:37 AM  I have reviewed the triage vital signs and the nursing notes.   HISTORY  Chief Complaint Knee Pain    HPI Jean Stout is a 78 y.o. female who presents to the emergency department for evaluation and treatment of right knee pain. History of bilateral knee osteoarthritis. Recent nerve block on the left leg and may pain in the right knee may be related to gait compensation.   Past Medical History:  Diagnosis Date  . Arthritis   . Coronary artery disease   . DM (diabetes mellitus) (HCC)   . GERD (gastroesophageal reflux disease)   . HLD (hyperlipidemia)   . HTN (hypertension)     Patient Active Problem List   Diagnosis Date Noted  . Primary osteoarthritis of left knee 10/09/2018  . Localized primary osteoarthritis of shoulder regions, bilateral 10/09/2018  . Right wrist pain 10/09/2018  . Chronic pain of right ankle 10/09/2018  . Chronic pain syndrome 10/09/2018  . Polyarthralgia 10/09/2018  . History of bilateral hip replacements 10/09/2018  . Aortic stenosis, moderate 03/13/2018  . Coronary artery disease involving native coronary artery of native heart without angina pectoris 03/01/2018  . Depression 03/01/2018  . Essential hypertension 03/01/2018  . GERD without esophagitis 03/01/2018  . Hyperlipidemia, mixed 03/01/2018  . Iron deficiency anemia 03/01/2018  . OSA on CPAP 03/01/2018  . Type 2 diabetes mellitus without complication, without long-term current use of insulin (HCC) 03/01/2018  . Osteoarthritis of multiple joints 03/01/2018    Past Surgical History:  Procedure Laterality Date  . CAROTID STENT    . JOINT REPLACEMENT Bilateral 2007  . KNEE SURGERY Left 2018    Prior to Admission medications   Medication Sig Start Date End Date Taking? Authorizing Provider  Acetaminophen 500 MG coapsule Take by mouth.     [provider]  amLODipine (NORVASC) 10 MG tablet Take 10 mg by mouth daily.  03/01/18   [provider]  aspirin EC 81 MG tablet Take 81 mg by mouth daily.     [provider]  atorvastatin (LIPITOR) 40 MG tablet Take 40 mg by mouth daily at 6 PM.  03/01/18   [provider]  cetirizine (ZYRTEC) 10 MG tablet Take 10 mg by mouth as needed.  07/11/18 07/11/19  [provider]  cyclobenzaprine (FLEXERIL) 5 MG tablet Take 5 mg by mouth as needed. 10/16/18   [provider]  docusate sodium (COLACE) 100 MG capsule Take by mouth.    [provider]  DULoxetine (CYMBALTA) 60 MG capsule Take 60 mg by mouth daily.    [provider]  ferrous sulfate 325 (65 FE) MG tablet Take by mouth.    [provider]  hydrALAZINE (APRESOLINE) 50 MG tablet Take by mouth 3 (three) times daily.  04/04/18   [provider]  hydrochlorothiazide (HYDRODIURIL) 12.5 MG tablet Take 12.5 mg by mouth daily.  03/01/18   [provider]  lisinopril (PRINIVIL,ZESTRIL) 40 MG tablet Take 40 mg by mouth daily.  03/01/18   [provider]  meloxicam (MOBIC) 15 MG tablet Take by mouth. 05/15/18 05/15/19  [provider]  metFORMIN (GLUCOPHAGE-XR) 500 MG 24 hr tablet Take 500 mg by mouth daily with breakfast.  03/01/18   [provider]  naproxen sodium (ALEVE) 220 MG tablet Take by mouth.    [provider]  pantoprazole (PROTONIX) 40 MG tablet Take 40 mg by mouth  daily.  03/01/18   [provider]  traMADol (ULTRAM) 50 MG tablet Take 1 tablet (50 mg total) by mouth every 6 (six) hours as needed for up to 15 days for severe pain. 10/17/18 11/01/18  Edward JollyLateef, Bilal, MD    Allergies Iodinated diagnostic agents, Amlodipine besy-benazepril hcl, Bextra [valdecoxib], and Gadolinium derivatives  Family History  Problem Relation Age of Onset  . Bipolar disorder Sister     Social History Social History    Tobacco Use  . Smoking status: Never Smoker  . Smokeless tobacco: Never Used  Substance Use Topics  . Alcohol use: Not Currently    Frequency: Never  . Drug use: Never    Review of Systems Constitutional: Negative for fever. Cardiovascular: Negative for chest pain. Respiratory: Negative for shortness of breath. Musculoskeletal: Positive for right knee pain. Skin: positive for right knee swelling.  Neurological: Negative for decrease in sensation  ____________________________________________   PHYSICAL EXAM:  VITAL SIGNS: ED Triage Vitals  Enc Vitals Group     BP 10/23/18 1008 132/71     Pulse Rate 10/23/18 1008 64     Resp 10/23/18 1008 18     Temp 10/23/18 1008 98.9 F (37.2 C)     Temp Source 10/23/18 1008 Oral     SpO2 10/23/18 1008 96 %     Weight 10/23/18 1005 177 lb 14.6 oz (80.7 kg)     Height --      Head Circumference --      Peak Flow --      Pain Score 10/23/18 1004 9     Pain Loc --      Pain Edu? --      Excl. in GC? --     Constitutional: Alert and oriented. Well appearing and in no acute distress. Eyes: Conjunctivae are clear without discharge or drainage Head: Atraumatic Neck: Supple Respiratory: No cough. Respirations are even and unlabored. Musculoskeletal: Right knee is tender to palpation on the medial aspect with some mild swelling.  There is no obvious deformity.  She is able to flex and extend the knee.  No crepitus noted. Neurologic: Motor and sensory function is intact. Skin: Mild swelling over the medial aspect of the right knee without any open wounds or lesions. Psychiatric: Affect and behavior are appropriate.  ____________________________________________   LABS (all labs ordered are listed, but only abnormal results are displayed)  Labs Reviewed - No data to display ____________________________________________  RADIOLOGY  Image of the right knee shows no evidence for acute abnormality.  There is some mild medial  compartment narrowing but no fracture subluxation or joint effusion is noted. ____________________________________________   PROCEDURES  Procedures  ____________________________________________   INITIAL IMPRESSION / ASSESSMENT AND PLAN / ED COURSE  Jean Stout is a 78 y.o. who presents to the emergency department for treatment and evaluation of right knee pain.  No known injury.  See HPI for further details.  Patient is very reassured by her negative x-ray.  She plans to follow-up with her pain management provider.  She will also be given a referral for orthopedics.  She was encouraged to call and schedule appointment for further evaluation of the knee pain.  She was instructed to return to the emergency department for symptoms of change or worsen if she is unable to see primary care, pain management, or the orthopedic specialist.  Medications  ketorolac (TORADOL) 30 MG/ML injection 30 mg (30 mg Intramuscular Given 10/23/18 1135)    Pertinent labs & imaging  results that were available during my care of the patient were reviewed by me and considered in my medical decision making (see chart for details).  _________________________________________   FINAL CLINICAL IMPRESSION(S) / ED DIAGNOSES  Final diagnoses:  Acute pain of right knee    ED Discharge Orders    None       If controlled substance prescribed during this visit, 12 month history viewed on the [place] prior to issuing an initial prescription for Schedule II or III opiod.   Victorino Dike, FNP 10/23/18 1854    Lavonia Drafts, MD 10/29/18 1535

## 2018-10-24 ENCOUNTER — Telehealth: Payer: Self-pay

## 2018-10-24 NOTE — Telephone Encounter (Signed)
Patient notified UDS clear.

## 2018-10-24 NOTE — Telephone Encounter (Signed)
The patient called in wanting the results from her UDS. Please call her

## 2018-11-02 ENCOUNTER — Other Ambulatory Visit: Payer: Self-pay

## 2018-11-02 ENCOUNTER — Encounter: Payer: Self-pay | Admitting: Psychiatry

## 2018-11-02 ENCOUNTER — Ambulatory Visit (INDEPENDENT_AMBULATORY_CARE_PROVIDER_SITE_OTHER): Payer: Medicare Other | Admitting: Psychiatry

## 2018-11-02 DIAGNOSIS — F410 Panic disorder [episodic paroxysmal anxiety] without agoraphobia: Secondary | ICD-10-CM | POA: Diagnosis not present

## 2018-11-02 DIAGNOSIS — F411 Generalized anxiety disorder: Secondary | ICD-10-CM | POA: Diagnosis not present

## 2018-11-02 DIAGNOSIS — F5105 Insomnia due to other mental disorder: Secondary | ICD-10-CM

## 2018-11-02 DIAGNOSIS — F331 Major depressive disorder, recurrent, moderate: Secondary | ICD-10-CM | POA: Diagnosis not present

## 2018-11-02 MED ORDER — DULOXETINE HCL 30 MG PO CPEP: 30.0000 mg | ORAL_CAPSULE | Freq: Every day | ORAL | 1 refills | Status: DC

## 2018-11-02 MED ORDER — DULOXETINE HCL 60 MG PO CPEP: 60.0000 mg | ORAL_CAPSULE | Freq: Every day | ORAL | 1 refills | Status: DC

## 2018-11-02 NOTE — Progress Notes (Signed)
Virtual Visit via Telephone Note  I connected with Jean Stout on 11/02/18 at 10:45 AM EDT by telephone and verified that I am speaking with the correct person using two identifiers.   I discussed the limitations, risks, security and privacy concerns of performing an evaluation and management service by telephone and the availability of in person appointments. I also discussed with the patient that there may be a patient responsible charge related to this service. The patient expressed understanding and agreed to proceed.   I discussed the assessment and treatment plan with the patient. The patient was provided an opportunity to ask questions and all were answered. The patient agreed with the plan and demonstrated an understanding of the instructions.   The patient was advised to call back or seek an in-person evaluation if the symptoms worsen or if the condition fails to improve as anticipated.   BH MD OP Progress Note  11/02/2018 12:22 PM Jean Stout  MRN:  [id number]  Chief Complaint:  Chief Complaint    Follow-up     HPI:  Jean Stout is a 78 year old Caucasian female, widowed, lives in [place], has a history of MDD, GAD, panic attacks, hyperlipidemia, hypertension, coronary artery disease, diabetes melitis was evaluated by phone today.   Patient today reports that she was recently diagnosed with fibromyalgia.  She reports she was advised by her provider that Cymbalta helps with her fibromyalgia and she may benefit from a higher dosage.  She wonders whether her Cymbalta can be increased to 90 mg today.  She reports that Cymbalta is helpful with her depressive symptoms.  She denies any significant sadness crying spells, appetite changes.  She reports sleep is good.  Patient denies any suicidality, homicidality or perceptual disturbances.  Patient reports she is relocating to [place] to be with her daughter who is currently going through some health issues.  She is leaving in  September.  She denies any other concerns today.  Visit Diagnosis:    ICD-10-CM   1. MDD (major depressive disorder), recurrent episode, moderate (HCC)  F33.1 DULoxetine (CYMBALTA) 60 MG capsule    DULoxetine (CYMBALTA) 30 MG capsule  2. GAD (generalized anxiety disorder)  F41.1 DULoxetine (CYMBALTA) 60 MG capsule    DULoxetine (CYMBALTA) 30 MG capsule  3. 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  Past trials of Zoloft, melatonin, mirtazapine.  Past Medical History:  Past Medical History:  Diagnosis Date  . Arthritis   . Coronary artery disease   . DM (diabetes mellitus) (HCC)   . GERD (gastroesophageal reflux disease)   . HLD (hyperlipidemia)   . HTN (hypertension)     Past Surgical History:  Procedure Laterality Date  . CAROTID STENT    . JOINT REPLACEMENT Bilateral 2007  . KNEE SURGERY Left 2018    Family Psychiatric History: I have reviewed family psychiatric history from my progress note on 04/30/2018.  Family History:  Family History  Problem Relation Age of Onset  . Bipolar disorder Sister     Social History: I have reviewed social history from my progress note on 04/30/2018. Social History   Socioeconomic History  . Marital status: Widowed    Spouse name: Not on file  . Number of children: 3  . Years of education: Not on file  . Highest education level: Bachelor's degree (e.g., BA, AB,  BS)  Occupational History  . Not on file  Social Needs  . Financial resource strain: Hard  . Food insecurity    Worry: Often true    Inability: Often true  . Transportation needs    Medical: No    Non-medical: No  Tobacco Use  . Smoking status: Never Smoker  . Smokeless tobacco: Never Used  Substance and Sexual Activity  . Alcohol use: Not Currently    Frequency: Never  . Drug use: Never  . Sexual  activity: Not Currently  Lifestyle  . Physical activity    Days per week: 5 days    Minutes per session: 20 min  . Stress: Rather much  Relationships  . Social Musicianconnections    Talks on phone: Not on file    Gets together: Not on file    Attends religious service: More than 4 times per year    Active member of club or organization: Yes    Attends meetings of clubs or organizations: Never    Relationship status: Widowed  Other Topics Concern  . Not on file  Social History Narrative  . Not on file    Allergies:  Allergies  Allergen Reactions  . Iodinated Diagnostic Agents Itching  . Amlodipine Besy-Benazepril Hcl Rash  . Bextra [Valdecoxib] Rash  . Gadolinium Derivatives Rash    Metabolic Disorder Labs: No results found for: HGBA1C, MPG No results found for: PROLACTIN No results found for: CHOL, TRIG, HDL, CHOLHDL, VLDL, LDLCALC No results found for: TSH  Therapeutic Level Labs: No results found for: LITHIUM No results found for: VALPROATE No components found for:  CBMZ  Current Medications: Current Outpatient Medications  Medication Sig Dispense Refill  . Acetaminophen 500 MG coapsule Take by mouth.    Marland Kitchen. amLODipine (NORVASC) 10 MG tablet Take 10 mg by mouth daily.     Marland Kitchen. aspirin EC 81 MG tablet Take 81 mg by mouth daily.     Marland Kitchen. atorvastatin (LIPITOR) 40 MG tablet Take 40 mg by mouth daily at 6 PM.     . cetirizine (ZYRTEC) 10 MG tablet Take 10 mg by mouth as needed.     . cyclobenzaprine (FLEXERIL) 5 MG tablet Take 5 mg by mouth as needed.    . docusate sodium (COLACE) 100 MG capsule Take by mouth.    . DULoxetine (CYMBALTA) 30 MG capsule Take 1 capsule (30 mg total) by mouth daily. To be combined with 60 mg 90 capsule 1  . DULoxetine (CYMBALTA) 60 MG capsule Take 1 capsule (60 mg total) by mouth daily. To be combined with 30 mg 90 capsule 1  . ferrous sulfate 325 (65 FE) MG tablet Take by mouth.    . hydrALAZINE (APRESOLINE) 50 MG tablet Take by mouth 3 (three) times  daily.     . hydrochlorothiazide (HYDRODIURIL) 12.5 MG tablet Take 12.5 mg by mouth daily.     Marland Kitchen. lisinopril (PRINIVIL,ZESTRIL) 40 MG tablet Take 40 mg by mouth daily.     . meloxicam (MOBIC) 15 MG tablet Take by mouth.    . metFORMIN (GLUCOPHAGE-XR) 500 MG 24 hr tablet Take 500 mg by mouth daily with breakfast.     . naproxen sodium (ALEVE) 220 MG tablet Take by mouth.    . pantoprazole (PROTONIX) 40 MG tablet Take 40 mg by mouth daily.      No current facility-administered medications for this visit.      Musculoskeletal: Strength & Muscle Tone: UTA Gait & Station: Reports as WNL  Patient leans: N/A  Psychiatric Specialty Exam: Review of Systems  Psychiatric/Behavioral: Positive for depression.  All other systems reviewed and are negative.   There were no vitals taken for this visit.There is no height or weight on file to calculate BMI.  General Appearance: UTA  Eye Contact:  UTA  Speech:  Clear and Coherent  Volume:  Normal  Mood:  Depressed improving  Affect:  UTA  Thought Process:  Goal Directed and Descriptions of Associations: Intact  Orientation:  Full (Time, Place, and Person)  Thought Content: Logical   Suicidal Thoughts:  No  Homicidal Thoughts:  No  Memory:  Immediate;   Fair Recent;   Fair Remote;   Fair  Judgement:  Fair  Insight:  Fair  Psychomotor Activity:  UTA  Concentration:  Concentration: Fair and Attention Span: Fair  Recall:  [employer] of Knowledge: Fair  Language: Fair  Akathisia:  No  Handed:  Right  AIMS (if indicated): Denies tremors,rigidty  Assets:  Communication Skills Desire for Improvement Social Support  ADL's:  Intact  Cognition: WNL  Sleep:  Fair   Screenings: PHQ2-9     Office Visit from 10/09/2018 in [facility]  PHQ-2 Total Score  4  PHQ-9 Total Score  18       Assessment and Plan: Jean Stout is a 78 year old Caucasian female, retired, lives in [place], recently relocated from  [place], has a history of MDD, GAD, hyperlipidemia, hypertension, diabetes mellitus, coronary artery disease was evaluated by phone today.  Patient is biologically predisposed given her family history.  She also has psychosocial stressors including recent COVID-19 outbreak, social isolation, financial problems and recent relocation.  Patient reports since she also struggles with fibromyalgia would like her Cymbalta to be increased to address her fatigue and other symptoms of fibromyalgia.  She reports her mood symptoms is improving.  Plan as noted below.  Plan MDD-improving We will increase Cymbalta to 90 mg p.o. daily to address her mood as well as her fibromyalgia. Continue CBT with Ms. Miguel Dibble.  For GAD-improving Cymbalta as prescribed  For panic attacks -improving Continue CBT  For insomnia- improving Continue to use CPAP  Patient is relocating to [place] and will establish care with a provider there.  I have spent atleast 15 minutes non - face to face with patient today. More than 50 % of the time was spent for psychoeducation and supportive psychotherapy and care coordination.  This note was generated in part or whole with voice recognition software. Voice recognition is usually quite accurate but there are transcription errors that can and very often do occur. I apologize for any typographical errors that were not detected and corrected.       Ursula Alert, MD 11/02/2018, 12:22 PM

## 2018-11-07 ENCOUNTER — Encounter: Payer: Self-pay | Admitting: Student in an Organized Health Care Education/Training Program

## 2018-11-08 ENCOUNTER — Other Ambulatory Visit: Payer: Self-pay

## 2018-11-08 ENCOUNTER — Ambulatory Visit
Payer: Medicare Other | Attending: Student in an Organized Health Care Education/Training Program | Admitting: Student in an Organized Health Care Education/Training Program

## 2018-11-08 DIAGNOSIS — M19012 Primary osteoarthritis, left shoulder: Secondary | ICD-10-CM

## 2018-11-08 DIAGNOSIS — M19011 Primary osteoarthritis, right shoulder: Secondary | ICD-10-CM | POA: Diagnosis not present

## 2018-11-08 DIAGNOSIS — M1712 Unilateral primary osteoarthritis, left knee: Secondary | ICD-10-CM

## 2018-11-08 DIAGNOSIS — M2550 Pain in unspecified joint: Secondary | ICD-10-CM

## 2018-11-08 DIAGNOSIS — M25531 Pain in right wrist: Secondary | ICD-10-CM

## 2018-11-08 DIAGNOSIS — G894 Chronic pain syndrome: Secondary | ICD-10-CM | POA: Diagnosis not present

## 2018-11-08 DIAGNOSIS — Z96643 Presence of artificial hip joint, bilateral: Secondary | ICD-10-CM

## 2018-11-08 DIAGNOSIS — M25571 Pain in right ankle and joints of right foot: Secondary | ICD-10-CM

## 2018-11-08 DIAGNOSIS — G8929 Other chronic pain: Secondary | ICD-10-CM

## 2018-11-08 MED ORDER — TRAMADOL HCL 50 MG PO TABS: 100.0000 mg | ORAL_TABLET | Freq: Two times a day (BID) | ORAL | 1 refills | Status: DC | PRN

## 2018-11-08 NOTE — Progress Notes (Signed)
Pain Management Virtual Encounter Note - Virtual Visit via [place] (real-time audio visits between healthcare provider and patient).   Patient's Phone No. & Preferred Pharmacy:  [phone] (home); There is no such number on file (mobile).; (Preferred) [phone] No e-mail address on record  [place], [place] [address] [place] [address] Phone: [phone] Fax: [phone]    Pre-screening note:  Our staff contacted Jean Stout and offered her an "in person", "face-to-face" appointment versus a telephone encounter. She indicated preferring the telephone encounter, at this time.   Reason for Virtual Visit: COVID-19*  Social distancing based on CDC and AMA recommendations.   I contacted Jean Stout on 11/08/2018 via telephone.      I clearly identified myself as Gillis Santa, MD. I verified that I was speaking with the correct person using two identifiers (Name: Jean Stout, and date of birth: January 06, 1941).  Advanced Informed Consent I sought verbal advanced consent from Jean Stout for virtual visit interactions. I informed Jean Stout of possible security and privacy concerns, risks, and limitations associated with providing "not-in-person" medical evaluation and management services. I also informed Jean Stout of the availability of "in-person" appointments. Finally, I informed her that there would be a charge for the virtual visit and that she could be  personally, fully or partially, financially responsible for it. Jean Stout expressed understanding and agreed to proceed.   Historic Elements   Jean Stout is a 78 y.o. year old, female patient evaluated today after her last encounter by our practice on 10/24/2018. Jean Stout  has a past medical history of Arthritis, Coronary artery disease, DM (diabetes mellitus) ([place]), GERD (gastroesophageal reflux disease), HLD (hyperlipidemia), and HTN  (hypertension). She also  has a past surgical history that includes Carotid stent; Joint replacement (Bilateral, 2007); and Knee surgery (Left, 2018). Jean Stout has a current medication list which includes the following prescription(s): amlodipine, aspirin ec, atorvastatin, cyclobenzaprine, docusate sodium, duloxetine, duloxetine, ferrous sulfate, hydralazine, hydrochlorothiazide, lisinopril, metformin, pantoprazole, acetaminophen, cetirizine, meloxicam, naproxen sodium, and tramadol. She  reports that she has never smoked. She has never used smokeless tobacco. She reports previous alcohol use. She reports that she does not use drugs. Jean Stout is allergic to iodinated diagnostic agents; amlodipine besy-benazepril hcl; bextra [valdecoxib]; and gadolinium derivatives.   HPI  Today, she is being contacted for both, medication management and a post-procedure assessment.   Patient follows up status post left knee genicular nerve block performed on 10/17/2018.  Unfortunately this block was not effective for the patient's left knee pain so as a result I do not recommend proceeding forward with left knee genicular radiofrequency ablation.  Patient's urine drug screen on repeat was normal.  Will prescribe patient tramadol 50 mg 4 times daily PRN, quantity 120/month, 1 refill.  Patient is moving to [place] in October to be with her daughter.  Pharmacotherapy Assessment  Analgesic: Tramadol 50 mg QID PRN, #120/month  Monitoring: Pharmacotherapy: No side-effects or adverse reactions reported.  PMP: PDMP reviewed during this encounter.       Compliance: No problems identified. Effectiveness: Clinically acceptable. Plan: Refer to "POC".  UDS:  Summary  Date Value Ref Range Status  10/17/2018 Note  Final    Comment:    ==================================================================== Compliance Drug Analysis, Ur ==================================================================== Test  Result       Flag       Units Drug Present and Declared for Prescription Verification   Duloxetine                     PRESENT      EXPECTED   Acetaminophen                  PRESENT      EXPECTED   Naproxen                       PRESENT      EXPECTED Drug Present not Declared for Prescription Verification   Orphenadrine                   PRESENT      UNEXPECTED   Citalopram                     PRESENT      UNEXPECTED   Desmethylcitalopram            PRESENT      UNEXPECTED    Desmethylcitalopram is an expected metabolite of citalopram or the    enantiomeric form, escitalopram. Drug Absent but Declared for Prescription Verification   Tramadol                       Not Detected UNEXPECTED ng/mg creat   Cyclobenzaprine                Not Detected UNEXPECTED   Salicylate                     Not Detected UNEXPECTED    Aspirin, as indicated in the declared medication list, is not always    detected even when used as directed. ==================================================================== Test                      Result    Flag   Units      Ref Range   Creatinine              113              mg/dL      >=40>=20 ==================================================================== Declared Medications:  The flagging and interpretation on this report are based on the  following declared medications.  Unexpected results may arise from  inaccuracies in the declared medications.  **Note: The testing scope of this panel includes these medications:  Cyclobenzaprine (Flexeril)  Duloxetine (Cymbalta)  Naproxen (Aleve)  Tramadol (Ultram)  **Note: The testing scope of this panel does not include small to  moderate amounts of these reported medications:  Acetaminophen  Aspirin  **Note: The testing scope of this panel does not include the  following reported medications:  Amlodipine (Norvasc)  Atorvastatin (Lipitor)  Cetirizine (Zyrtec)  Docusate (Colace)  Hydralazine  (Apresoline)  Hydrochlorothiazide (Hydrodiuril)  Iron  Lisinopril (Zestril)  Meloxicam (Mobic)  Metformin (Glucophage)  Pantoprazole (Protonix) ==================================================================== For clinical consultation, please call [phone]. ====================================================================    Laboratory Chemistry Profile (12 mo)  Renal: No results found for requested labs within last 8760 hours.  No results found for: GFRAA, [facility] PartnershipGFRNONAA Hepatic: No results found for requested labs within last 8760 hours. No results found for: AST, ALT Other: No results found for requested labs within last 8760 hours. Note: Above Lab results reviewed.  Imaging  Last 90 days:  Dg  Knee Complete 4 Views Right  Result Date: 10/23/2018 CLINICAL DATA:  Patient complains of right knee pain x 2 days. Patient states she has been compensating with right knee due to left knee pain. Pain to lateral right knee. Hx of chronic right knee pain. EXAM: RIGHT KNEE - COMPLETE 4+ VIEW COMPARISON:  None. FINDINGS: Mild MEDIAL compartment narrowing. No acute fracture or subluxation. No joint effusion. Atherosclerotic calcification identified in the popliteal artery. IMPRESSION: No evidence for acute abnormality. Electronically Signed   By: Norva PavlovElizabeth  Brown M.D.   On: 10/23/2018 11:34   [facility] C-arm 1-60 Min No Report  Result Date: 10/17/2018 Fluoro was used, but no Radiologist interpretation will be provided. Please refer to "NOTES" tab for provider progress note.   Assessment  The primary encounter diagnosis was Primary osteoarthritis of left knee. Diagnoses of Localized primary osteoarthritis of shoulder regions, bilateral, Chronic pain syndrome, History of bilateral hip replacements, Right wrist pain, Chronic pain of right ankle, and Polyarthralgia were also pertinent to this visit.  Plan of Care  I am having Jean Stout start on traMADol. I am also having her  maintain her pantoprazole, metFORMIN, lisinopril, hydrochlorothiazide, hydrALAZINE, amLODipine, aspirin EC, naproxen sodium, docusate sodium, ferrous sulfate, atorvastatin, meloxicam, cetirizine, Acetaminophen, cyclobenzaprine, DULoxetine, and DULoxetine.  Pharmacotherapy (Medications Ordered): Meds ordered this encounter  Medications  . traMADol (ULTRAM) 50 MG tablet    Sig: Take 2 tablets (100 mg total) by mouth every 12 (twelve) hours as needed for severe pain.    Dispense:  120 tablet    Refill:  1    Fill one day early if pharmacy is closed on scheduled refill date. Do not fill until:  To last until:   Follow-up plan:   Return if symptoms worsen or fail to improve.        Recent Visits Date Type Provider Dept  10/17/18 Procedure visit Edward JollyLateef, Sachin Ferencz, MD [facility]  10/09/18 Office Visit Edward JollyLateef, Beniah Magnan, MD [facility]  Showing recent visits within past 90 days and meeting all other requirements   Today's Visits Date Type Provider Dept  11/08/18 Office Visit Edward JollyLateef, Rox Mcgriff, MD [facility]  Showing today's visits and meeting all other requirements   Future Appointments No visits were found meeting these conditions.  Showing future appointments within next 90 days and meeting all other requirements   I discussed the assessment and treatment plan with the patient. The patient was provided an opportunity to ask questions and all were answered. The patient agreed with the plan and demonstrated an understanding of the instructions.  Patient advised to call back or seek an in-person evaluation if the symptoms or condition worsens.  Total duration of non-face-to-face encounter: 25 minutes.  Note by: Edward JollyBilal Jourdyn Hasler, MD Date: 11/08/2018; Time: 11:04 AM  Note: This dictation was prepared with Dragon dictation. Any transcriptional errors that may result from this process are unintentional.  Disclaimer:  * Given the special circumstances of the COVID-19  pandemic, the federal government has announced that the Office for Civil Rights (OCR) will exercise its enforcement discretion and will not impose penalties on physicians using telehealth in the event of noncompliance with regulatory requirements under the [employer] Insurance Portability and Accountability Act (HIPAA) in connection with the good faith provision of telehealth during the COVID-19 national public health emergency. (AMA)

## 2018-11-10 ENCOUNTER — Other Ambulatory Visit: Payer: Self-pay | Admitting: Student in an Organized Health Care Education/Training Program

## 2018-11-12 ENCOUNTER — Telehealth: Payer: Self-pay

## 2018-11-12 ENCOUNTER — Other Ambulatory Visit: Payer: Self-pay | Admitting: Student in an Organized Health Care Education/Training Program

## 2018-11-12 ENCOUNTER — Telehealth: Payer: Self-pay | Admitting: Student in an Organized Health Care Education/Training Program

## 2018-11-12 MED ORDER — TRAMADOL HCL 50 MG PO TABS: 100.0000 mg | ORAL_TABLET | Freq: Two times a day (BID) | ORAL | 1 refills | Status: DC | PRN

## 2018-11-12 NOTE — Telephone Encounter (Signed)
Patient called at 10:15 and left vmail asking if her meds has been sent in to pharmacy. Please verify and let patient know if this has been done.

## 2018-11-12 NOTE — Progress Notes (Signed)
Tramadol Rx was accidentally printed rather than e-Rx. E-Rx as below  Requested Prescriptions   Signed Prescriptions Disp Refills  . traMADol (ULTRAM) 50 MG tablet 120 tablet 1    Sig: Take 2 tablets (100 mg total) by mouth every 12 (twelve) hours as needed for severe pain.

## 2018-11-12 NOTE — Telephone Encounter (Signed)
Patient called. Medication received.

## 2018-11-12 NOTE — Telephone Encounter (Signed)
Pt Called and wants someone to check on Tramadol Rx Dr Holley Raring was sending to [facility] for her. They havn't received it.

## 2018-11-12 NOTE — Telephone Encounter (Signed)
Called and left message on answering machine thather medication was sent in with one refill to [facility].

## 2018-11-15 ENCOUNTER — Other Ambulatory Visit: Payer: Self-pay

## 2018-11-15 DIAGNOSIS — Z20822 Contact with and (suspected) exposure to covid-19: Secondary | ICD-10-CM

## 2018-11-16 ENCOUNTER — Telehealth: Payer: Self-pay | Admitting: General Practice

## 2018-11-16 LAB — NOVEL CORONAVIRUS, NAA: SARS-CoV-2, NAA: NOT DETECTED

## 2018-11-16 NOTE — Telephone Encounter (Signed)
Patient informed of negative covid result. Patient verbalized understanding.   

## 2019-05-14 ENCOUNTER — Telehealth: Payer: Self-pay | Admitting: Student in an Organized Health Care Education/Training Program

## 2019-05-14 NOTE — Telephone Encounter (Signed)
Am calling patient to set up Virtual for Thurs 05-16-19

## 2019-05-14 NOTE — Telephone Encounter (Signed)
Patient has moved back to [facility] and is asking if she can come back to see Dr. Cherylann Ratel as Pain Mgmt. She had moved to [place] previously but has now returned. Dr. Cherylann Ratel is it ok to schedule her for a Virtual ? Or would you like her to come in clinic. Please advise.

## 2019-05-15 ENCOUNTER — Encounter: Payer: Self-pay | Admitting: Student in an Organized Health Care Education/Training Program

## 2019-05-16 ENCOUNTER — Ambulatory Visit
Payer: Medicare Other | Attending: Student in an Organized Health Care Education/Training Program | Admitting: Student in an Organized Health Care Education/Training Program

## 2019-05-16 ENCOUNTER — Other Ambulatory Visit: Payer: Self-pay

## 2019-05-16 ENCOUNTER — Encounter: Payer: Self-pay | Admitting: Student in an Organized Health Care Education/Training Program

## 2019-05-16 DIAGNOSIS — F331 Major depressive disorder, recurrent, moderate: Secondary | ICD-10-CM

## 2019-05-16 DIAGNOSIS — M19011 Primary osteoarthritis, right shoulder: Secondary | ICD-10-CM | POA: Diagnosis not present

## 2019-05-16 DIAGNOSIS — M19012 Primary osteoarthritis, left shoulder: Secondary | ICD-10-CM

## 2019-05-16 DIAGNOSIS — F411 Generalized anxiety disorder: Secondary | ICD-10-CM | POA: Diagnosis not present

## 2019-05-16 DIAGNOSIS — M1712 Unilateral primary osteoarthritis, left knee: Secondary | ICD-10-CM | POA: Diagnosis not present

## 2019-05-16 DIAGNOSIS — F410 Panic disorder [episodic paroxysmal anxiety] without agoraphobia: Secondary | ICD-10-CM

## 2019-05-16 DIAGNOSIS — G894 Chronic pain syndrome: Secondary | ICD-10-CM

## 2019-05-16 MED ORDER — DULOXETINE HCL 30 MG PO CPEP: 90.0000 mg | ORAL_CAPSULE | Freq: Every day | ORAL | 2 refills | Status: DC

## 2019-05-16 MED ORDER — CYCLOBENZAPRINE HCL 5 MG PO TABS: 5.0000 mg | ORAL_TABLET | Freq: Two times a day (BID) | ORAL | 2 refills | Status: DC | PRN

## 2019-05-16 MED ORDER — TRAMADOL HCL 50 MG PO TABS: 100.0000 mg | ORAL_TABLET | Freq: Two times a day (BID) | ORAL | 2 refills | Status: DC

## 2019-05-16 NOTE — Progress Notes (Signed)
Patient: Jean Stout  Service Category: E/Jean  Provider: Gillis Santa, MD  DOB: [birth year]  DOS: 05/16/2019  Location: Office  MRN: [id number]  Setting: Ambulatory outpatient  Referring Provider: Wayland Denis, PA-C  Type: Established Patient  Specialty: Interventional Pain Management  PCP: Wayland Denis, PA-C  Location: Home  Delivery: TeleHealth     Virtual Encounter - Pain Management PROVIDER NOTE: Information contained herein reflects review and annotations entered in association with encounter. Interpretation of such information and data should be left to medically-trained personnel. Information provided to patient can be located elsewhere in the medical record under "Patient Instructions". Document created using STT-dictation technology, any transcriptional errors that may result from process are unintentional.    Contact & Pharmacy Preferred: [phone] Home: [phone] (home) Mobile: There is no such number on file (mobile). E-mail: No e-mail address on record  [id number]Phone: [phone]: [phone]  Pre-screening  Ms. DKohenoffered "in-person" vs "virtual" encounter. Jean Stout indicated preferring virtual for this encounter.   Reason COVID-19*  Social distancing based on CDC and AMA recommendations.   I contacted MKyndahl Jablonon 05/16/2019 via telephone.      I clearly identified myself as BGillis Santa MD. I verified that I was speaking with the correct person using two identifiers (Name: MNariyah Stout and date of birth: 1August 30, 1942.   This visit was completed via telephone due to the restrictions of the COVID-19 pandemic. All issues as above were discussed and addressed but no physical exam was performed. If it was felt that the patient should be evaluated in the office, they were directed there. The patient verbally consented to this visit. Patient was unable to complete an  audio/visual visit due to Technical difficulties and/or Lack of internet. Due to the catastrophic nature of the COVID-19 pandemic, this visit was done through audio contact only.  Location of the patient: home address (see Epic for details)  Location of the provider: office  Consent I sought verbal advanced consent from Jean Severinfor virtual visit interactions. I informed Ms. DLinquistof possible security and privacy concerns, risks, and limitations associated with providing "not-in-person" medical evaluation and management services. I also informed Ms. DCreanof the availability of "in-person" appointments. Finally, I informed her that there would be a charge for the virtual visit and that Jean Stout could be  personally, fully or partially, financially responsible for it. Ms. DHaackexpressed understanding and agreed to proceed.   Historic Elements   Ms. MKaitlynn Tramontanais a 79y.o. year old, female patient evaluated today after her last contact with our practice on 05/14/2019. Ms. DHancox has a past medical history of Arthritis, Coronary artery disease, [place], GERD (gastroesophageal reflux disease), HLD (hyperlipidemia), and HTN (hypertension). Jean Stout also  has a past surgical history that includes Carotid stent; Joint replacement (Bilateral, 2007); and Knee surgery (Left, 2018). Ms. DShiffmanhas a current medication list which includes the following prescription(s): amlodipine, aspirin ec, atorvastatin, ferrous sulfate, hydralazine, hydrochlorothiazide, lisinopril, metformin, pantoprazole, acetaminophen, cetirizine, cyclobenzaprine, docusate sodium, duloxetine, naproxen sodium, and [START ON 06/13/2019] tramadol. Jean Stout  reports that Jean Stout has never smoked. Jean Stout has never used smokeless tobacco. Jean Stout reports previous alcohol use. Jean Stout reports that Jean Stout does not use drugs. Ms. DCraginis allergic to iodinated diagnostic agents; amlodipine besy-benazepril hcl; bextra [valdecoxib]; and gadolinium derivatives.    HPI  Today, Jean Stout is being contacted for medication management.   Moved back from  [place]. Has had genicular nerve ablation in [place] for left knee which provided some relief. Still having superior left knee pain; discussed left knee IA knee steroid injection. Right knee doing well. Here for refill Tramadol as well. Is on Cymbalta 90 mg daily and Flexeril 5 mg BID prn.  Pharmacotherapy Assessment  Analgesic: Tramadol 50 mg QID PRN, #120/month   Monitoring: [place] PMP: PDMP reviewed during this encounter.       Pharmacotherapy: No side-effects or adverse reactions reported. Compliance: No problems identified. Effectiveness: Clinically acceptable. Plan: Refer to "POC".  UDS:  Summary  Date Value Ref Range Status  10/17/2018 Note  Final    Comment:    ==================================================================== Compliance Drug Analysis, Ur ==================================================================== Test                             Result       Flag       Units Drug Present and Declared for Prescription Verification   Duloxetine                     PRESENT      EXPECTED   Acetaminophen                  PRESENT      EXPECTED   Naproxen                       PRESENT      EXPECTED Drug Present not Declared for Prescription Verification   Orphenadrine                   PRESENT      UNEXPECTED   Citalopram                     PRESENT      UNEXPECTED   Desmethylcitalopram            PRESENT      UNEXPECTED    Desmethylcitalopram is an expected metabolite of citalopram or the    enantiomeric form, escitalopram. Drug Absent but Declared for Prescription Verification   Tramadol                       Not Detected UNEXPECTED ng/mg creat   Cyclobenzaprine                Not Detected UNEXPECTED   Salicylate                     Not Detected UNEXPECTED    Aspirin, as indicated in the declared medication list, is not always    detected even when used as  directed. ==================================================================== Test                      Result    Flag   Units      Ref Range   Creatinine              113              mg/dL      >=20 ==================================================================== Declared Medications:  The flagging and interpretation on this report are based on the  following declared medications.  Unexpected results may arise from  inaccuracies in the declared medications.  **Note: The testing scope of this panel includes these medications:  Cyclobenzaprine (Flexeril)  Duloxetine (Cymbalta)  Naproxen (  Aleve)  Tramadol (Ultram)  **Note: The testing scope of this panel does not include small to  moderate amounts of these reported medications:  Acetaminophen  Aspirin  **Note: The testing scope of this panel does not include the  following reported medications:  Amlodipine (Norvasc)  Atorvastatin (Lipitor)  Cetirizine (Zyrtec)  Docusate (Colace)  Hydralazine (Apresoline)  Hydrochlorothiazide (Hydrodiuril)  Iron  Lisinopril (Zestril)  Meloxicam (Mobic)  Metformin (Glucophage)  Pantoprazole (Protonix) ==================================================================== For clinical consultation, please call [phone]. ====================================================================    Laboratory Chemistry Profile   Renal No results found for: BUN, CREATININE, LABCREA, BCR, GFR, GFRAA, GFRNONAA, LABVMA, EPIRU, EPINEPH24HUR, NOREPRU, NOREPI24HUR, DOPARU, [id number]  Hepatic No results found for: AST, ALT, ALBUMIN, ALKPHOS, HCVAB, AMYLASE, LIPASE, AMMONIA  Electrolytes No results found for: NA, K, CL, CALCIUM, MG, PHOS  Bone No results found for: VD25OH, [id number], [id number], HA1937TK2, 25OHVITD1, 25OHVITD2, 25OHVITD3, TESTOFREE, TESTOSTERONE  Inflammation (CRP: Acute Phase) (ESR: Chronic Phase) No results found for: CRP, ESRSEDRATE, LATICACIDVEN    Note: Above Lab results  reviewed.  Imaging  DG Knee Complete 4 Views Right CLINICAL DATA:  Patient complains of right knee pain x 2 days. Patient states Jean Stout has been compensating with right knee due to left knee pain. Pain to lateral right knee. Hx of chronic right knee pain.  EXAM: RIGHT KNEE - COMPLETE 4+ VIEW  COMPARISON:  None.  FINDINGS: Mild MEDIAL compartment narrowing. No acute fracture or subluxation. No joint effusion. Atherosclerotic calcification identified in the popliteal artery.  IMPRESSION: No evidence for acute abnormality.  Electronically Signed   By: Nolon Nations Jean.D.   On: 10/23/2018 11:34  Assessment  The primary encounter diagnosis was Primary osteoarthritis of left knee. Diagnoses of Localized primary osteoarthritis of shoulder regions, bilateral, MDD (major depressive disorder), recurrent episode, moderate ([place]), GAD (generalized anxiety disorder), Panic attacks, and Chronic pain syndrome were also pertinent to this visit.  Plan of Care  Jean Stout has a current medication list which includes the following long-term medication(s): amlodipine, atorvastatin, ferrous sulfate, hydralazine, hydrochlorothiazide, lisinopril, metformin, pantoprazole, cetirizine, and duloxetine.  Pharmacotherapy (Medications Ordered): Meds ordered this encounter  Medications  . cyclobenzaprine (FLEXERIL) 5 MG tablet    Sig: Take 1 tablet (5 mg total) by mouth 2 (two) times daily as needed.    Dispense:  60 tablet    Refill:  2  . DULoxetine (CYMBALTA) 30 MG capsule    Sig: Take 3 capsules (90 mg total) by mouth daily.    Dispense:  90 capsule    Refill:  2  . traMADol (ULTRAM) 50 MG tablet    Sig: Take 2 tablets (100 mg total) by mouth 2 (two) times daily.    Dispense:  120 tablet    Refill:  2    For chronic pain syndrome   Orders:  Orders Placed This Encounter  Procedures  . KNEE INJECTION    Local Anesthetic & Steroid injection.    Standing Status:   Future    Standing  Expiration Date:   06/15/2019    Scheduling Instructions:     Side:LEFT     Sedation: None     Timeframe: As soon as schedule allows    Order Specific Question:   Where will this procedure be performed?    Answer:   [facility]   Follow-up plan:   Return in about 2 weeks (around 05/30/2019) for left knee steroid .    Recent Visits No visits were found meeting these conditions.  Showing recent  visits within past 90 days and meeting all other requirements   Today's Visits Date Type Provider Dept  05/16/19 Office Visit Gillis Santa, MD [facility]  Showing today's visits and meeting all other requirements   Future Appointments No visits were found meeting these conditions.  Showing future appointments within next 90 days and meeting all other requirements   I discussed the assessment and treatment plan with the patient. The patient was provided an opportunity to ask questions and all were answered. The patient agreed with the plan and demonstrated an understanding of the instructions.  Patient advised to call back or seek an in-person evaluation if the symptoms or condition worsens.  Duration of encounter: 25 minutes.  Note by: Gillis Santa, MD Date: 05/16/2019; Time: 10:17 AM

## 2019-05-27 ENCOUNTER — Encounter: Payer: Self-pay | Admitting: Student in an Organized Health Care Education/Training Program

## 2019-05-27 ENCOUNTER — Other Ambulatory Visit: Payer: Self-pay

## 2019-05-27 ENCOUNTER — Ambulatory Visit
Payer: Medicare Other | Attending: Student in an Organized Health Care Education/Training Program | Admitting: Student in an Organized Health Care Education/Training Program

## 2019-05-27 VITALS — BP 142/74 | HR 73 | Temp 97.3°F | Resp 16 | Ht 62.0 in | Wt 185.0 lb

## 2019-05-27 DIAGNOSIS — M1712 Unilateral primary osteoarthritis, left knee: Secondary | ICD-10-CM | POA: Diagnosis not present

## 2019-05-27 MED ORDER — LIDOCAINE HCL 2 % IJ SOLN
INTRAMUSCULAR | Status: AC
  Filled 2019-05-27: qty 20

## 2019-05-27 MED ORDER — ROPIVACAINE HCL 2 MG/ML IJ SOLN
INTRAMUSCULAR | Status: AC
  Filled 2019-05-27: qty 10

## 2019-05-27 MED ORDER — METHYLPREDNISOLONE ACETATE 40 MG/ML IJ SUSP
40.0000 mg | Freq: Once | INTRAMUSCULAR | Status: AC
  Administered 2019-05-27: 40 mg via INTRA_ARTICULAR

## 2019-05-27 MED ORDER — METHYLPREDNISOLONE ACETATE 40 MG/ML IJ SUSP
INTRAMUSCULAR | Status: AC
  Filled 2019-05-27: qty 1

## 2019-05-27 MED ORDER — ROPIVACAINE HCL 2 MG/ML IJ SOLN
4.0000 mL | Freq: Once | INTRAMUSCULAR | Status: AC
  Administered 2019-05-27: 4 mL via INTRA_ARTICULAR

## 2019-05-27 MED ORDER — LIDOCAINE HCL 2 % IJ SOLN
20.0000 mL | Freq: Once | INTRAMUSCULAR | Status: AC
  Administered 2019-05-27: 400 mg

## 2019-05-27 NOTE — Progress Notes (Signed)
PROVIDER NOTE: Information contained herein reflects review and annotations entered in association with encounter. Interpretation of such information and data should be left to medically-trained personnel. Information provided to patient can be located elsewhere in the medical record under "Patient Instructions". Document created using STT-dictation technology, any transcriptional errors that may result from process are unintentional.    Patient: Jean Stout  Service Category: Procedure  Provider: Edward Jolly, MD  DOB: 30-Dec-1940  DOS: 05/27/2019  Location: [facility]  MRN: [id number]  Setting: Ambulatory - outpatient  Referring Provider: Carren Rang, PA-C  Type: Established Patient  Specialty: Interventional Pain Management  PCP: Carren Rang, PA-C   Primary Reason for Visit: Interventional Pain Management Treatment. CC: Knee Pain (left)  Procedure:          Anesthesia, Analgesia, Anxiolysis:  Type: Diagnostic Intra-Articular Local anesthetic and steroid Knee Injection #1  Region: Medial infrapatellar Knee Region Level: Knee Joint Laterality: Left knee  Type: Local Anesthesia Indication(s): Analgesia         Local Anesthetic: Lidocaine 1-2% Route: Infiltration ([place]/IM) IV Access: Declined Sedation: Declined   Position: Sitting   Indications: 1. Primary osteoarthritis of left knee    Pain Score: Pre-procedure: 7 /10 Post-procedure: 0-No pain(left knee numb while standing)/10   Pre-op Assessment:  Jean Stout is a 79 y.o. (year old), female patient, seen today for interventional treatment. She  has a past surgical history that includes Carotid stent; Joint replacement (Bilateral, 2007); and Knee surgery (Left, 2018). Jean Stout has a current medication list which includes the following prescription(s): acetaminophen, amlodipine, aspirin ec, atorvastatin, cetirizine, cyclobenzaprine, cyclobenzaprine, docusate sodium, duloxetine, ferrous sulfate, hydralazine,  hydrochlorothiazide, lisinopril, metformin, naproxen sodium, pantoprazole, and [START ON 06/13/2019] tramadol, and the following Facility-Administered Medications: lidocaine, methylprednisolone acetate, and ropivacaine (pf) 2 mg/ml (0.2%). Her primarily concern today is the Knee Pain (left)  Initial Vital Signs:  Pulse/HCG Rate: 73  Temp: (!) 97.3 F (36.3 C) Resp: 16 BP: (!) 142/74 SpO2: 99 %  BMI: Estimated body mass index is 33.84 kg/m as calculated from the following:   Height as of this encounter: 5\' 2"  (1.575 m).   Weight as of this encounter: 185 lb (83.9 kg).  Risk Assessment: Allergies: Reviewed. She is allergic to iodinated diagnostic agents; amlodipine besy-benazepril hcl; bextra [valdecoxib]; and gadolinium derivatives.  Allergy Precautions: None required Coagulopathies: Reviewed. None identified.  Blood-thinner therapy: None at this time Active Infection(s): Reviewed. None identified. Jean Stout is afebrile  Site Confirmation: Jean Stout was asked to confirm the procedure and laterality before marking the site Procedure checklist: Completed Consent: Before the procedure and under the influence of no sedative(s), amnesic(s), or anxiolytics, the patient was informed of the treatment options, risks and possible complications. To fulfill our ethical and legal obligations, as recommended by the American Medical Association's Code of Ethics, I have informed the patient of my clinical impression; the nature and purpose of the treatment or procedure; the risks, benefits, and possible complications of the intervention; the alternatives, including doing nothing; the risk(s) and benefit(s) of the alternative treatment(s) or procedure(s); and the risk(s) and benefit(s) of doing nothing. The patient was provided information about the general risks and possible complications associated with the procedure. These may include, but are not limited to: failure to achieve desired goals, infection,  bleeding, organ or nerve damage, allergic reactions, paralysis, and death. In addition, the patient was informed of those risks and complications associated to the procedure, such as failure to decrease pain; infection; bleeding; organ or  nerve damage with subsequent damage to sensory, motor, and/or autonomic systems, resulting in permanent pain, numbness, and/or weakness of one or several areas of the body; allergic reactions; (i.e.: anaphylactic reaction); and/or death. Furthermore, the patient was informed of those risks and complications associated with the medications. These include, but are not limited to: allergic reactions (i.e.: anaphylactic or anaphylactoid reaction(s)); adrenal axis suppression; blood sugar elevation that in diabetics may result in ketoacidosis or comma; water retention that in patients with history of congestive heart failure may result in shortness of breath, pulmonary edema, and decompensation with resultant heart failure; weight gain; swelling or edema; medication-induced neural toxicity; particulate matter embolism and blood vessel occlusion with resultant organ, and/or nervous system infarction; and/or aseptic necrosis of one or more joints. Finally, the patient was informed that Medicine is not an exact science; therefore, there is also the possibility of unforeseen or unpredictable risks and/or possible complications that may result in a catastrophic outcome. The patient indicated having understood very clearly. We have given the patient no guarantees and we have made no promises. Enough time was given to the patient to ask questions, all of which were answered to the patient's satisfaction. Jean Stout has indicated that she wanted to continue with the procedure. Attestation: I, the ordering provider, attest that I have discussed with the patient the benefits, risks, side-effects, alternatives, likelihood of achieving goals, and potential problems during recovery for the  procedure that I have provided informed consent. Date  Time: 05/27/2019 10:34 AM  Pre-Procedure Preparation:  Monitoring: As per clinic protocol. Respiration, ETCO2, SpO2, BP, heart rate and rhythm monitor placed and checked for adequate function Safety Precautions: Patient was assessed for positional comfort and pressure points before starting the procedure. Time-out: I initiated and conducted the "Time-out" before starting the procedure, as per protocol. The patient was asked to participate by confirming the accuracy of the "Time Out" information. Verification of the correct person, site, and procedure were performed and confirmed by me, the nursing staff, and the patient. "Time-out" conducted as per Joint Commission's Universal Protocol (UP.01.01.01). Time: 1111  Description of Procedure:          Target Area: Knee Joint Approach: Just above the Medial tibial plateau, lateral to the infrapatellar tendon. Area Prepped: Entire knee area, from the mid-thigh to the mid-shin. Prepping solution: DuraPrep (Iodine Povacrylex [0.7% available iodine] and Isopropyl Alcohol, 74% w/w) Safety Precautions: Aspiration looking for blood return was conducted prior to all injections. At no point did we inject any substances, as a needle was being advanced. No attempts were made at seeking any paresthesias. Safe injection practices and needle disposal techniques used. Medications properly checked for expiration dates. SDV (single dose vial) medications used. Description of the Procedure: Protocol guidelines were followed. The patient was placed in position over the fluoroscopy table. The target area was identified and the area prepped in the usual manner. Skin & deeper tissues infiltrated with local anesthetic. Appropriate amount of time allowed to pass for local anesthetics to take effect. The procedure needles were then advanced to the target area. Proper needle placement secured. Negative aspiration confirmed.  Solution injected in intermittent fashion, asking for systemic symptoms every 0.5cc of injectate. The needles were then removed and the area cleansed, making sure to leave some of the prepping solution back to take advantage of its long term bactericidal properties. Vitals:   05/27/19 1046  BP: (!) 142/74  Pulse: 73  Resp: 16  Temp: (!) 97.3 F (36.3 C)  SpO2:  99%  Weight: 185 lb (83.9 kg)  Height: 5\' 2"  (1.575 m)    Start Time: 1111 hrs. End Time: 1113 hrs. Materials:  Needle(s) Type: Regular needle Gauge: 25G Length: 1.5-in Medication(s): Please see orders for medications and dosing details. 4 cc solution made of 3 cc of 0.2% ropivacaine, 1 cc of methylprednisolone, 40 mg/cc. Imaging Guidance:          Type of Imaging Technique: None used Indication(s): N/A Exposure Time: No patient exposure Contrast: None used. Fluoroscopic Guidance: N/A Ultrasound Guidance: N/A Interpretation: N/A  Antibiotic Prophylaxis:   Anti-infectives (From admission, onward)   None     Indication(s): None identified  Post-operative Assessment:  Post-procedure Vital Signs:  Pulse/HCG Rate: 73  Temp: (!) 97.3 F (36.3 C) Resp: 16 BP: (!) 142/74 SpO2: 99 %  EBL: None  Complications: No immediate post-treatment complications observed by team, or reported by patient.  Note: The patient tolerated the entire procedure well. A repeat set of vitals were taken after the procedure and the patient was kept under observation following institutional policy, for this type of procedure. Post-procedural neurological assessment was performed, showing return to baseline, prior to discharge. The patient was provided with post-procedure discharge instructions, including a section on how to identify potential problems. Should any problems arise concerning this procedure, the patient was given instructions to immediately contact , at any time, without hesitation. In any case, we plan to contact the patient by  telephone for a follow-up status report regarding this interventional procedure.  Comments:  No additional relevant information.  Plan of Care   Medications ordered for procedure: Meds ordered this encounter  Medications  . methylPREDNISolone acetate (DEPO-MEDROL) injection 40 mg  . ropivacaine (PF) 2 mg/mL (0.2%) (NAROPIN) injection 4 mL  . lidocaine (XYLOCAINE) 2 % (with pres) injection 400 mg   Medications administered: Imogine Carvell had no medications administered during this visit.  See the medical record for exact dosing, route, and time of administration.  Follow-up plan:   Return in about 4 weeks (around 06/24/2019) for Post Procedure Evaluation, virtual.      Left intra-articular knee steroid injection on 05/27/2019   Recent Visits Date Type Provider Dept  05/16/19 Office Visit 05/18/19, MD [facility]  Showing recent visits within past 90 days and meeting all other requirements   Today's Visits Date Type Provider Dept  05/27/19 Procedure visit 07/27/19, MD [facility]  Showing today's visits and meeting all other requirements   Future Appointments Date Type Provider Dept  06/20/19 Appointment 06/22/19, MD [facility]  07/30/19 Appointment 09/29/19, MD [facility]  Showing future appointments within next 90 days and meeting all other requirements   Disposition: Discharge home  Discharge (Date  Time): 05/27/2019;   hrs.   Primary Care Physician: 07/27/2019, PA-C Location: [facility] Note by: [facility], MD Date: 05/27/2019; Time: 11:43 AM  Disclaimer:  Medicine is not an exact science. The only guarantee in medicine is that nothing is guaranteed. It is important to note that the decision to proceed with this intervention was based on the information collected from the patient. The Data and conclusions were drawn from the patient's questionnaire, the interview, and the  physical examination. Because the information was provided in large part by the patient, it cannot be guaranteed that it has not been purposely or unconsciously manipulated. Every effort has been made to obtain as much relevant data as possible for this  evaluation. It is important to note that the conclusions that lead to this procedure are derived in large part from the available data. Always take into account that the treatment will also be dependent on availability of resources and existing treatment guidelines, considered by other Pain Management Practitioners as being common knowledge and practice, at the time of the intervention. For Medico-Legal purposes, it is also important to point out that variation in procedural techniques and pharmacological choices are the acceptable norm. The indications, contraindications, technique, and results of the above procedure should only be interpreted and judged by a Board-Certified Interventional Pain Specialist with extensive familiarity and expertise in the same exact procedure and technique.

## 2019-06-18 ENCOUNTER — Telehealth: Payer: Self-pay

## 2019-06-18 NOTE — Telephone Encounter (Signed)
LM for patient to call office

## 2019-06-19 ENCOUNTER — Encounter: Payer: Self-pay | Admitting: Student in an Organized Health Care Education/Training Program

## 2019-06-20 ENCOUNTER — Encounter: Payer: Self-pay | Admitting: Student in an Organized Health Care Education/Training Program

## 2019-06-20 ENCOUNTER — Ambulatory Visit
Payer: Medicare Other | Attending: Student in an Organized Health Care Education/Training Program | Admitting: Student in an Organized Health Care Education/Training Program

## 2019-06-20 ENCOUNTER — Other Ambulatory Visit: Payer: Self-pay

## 2019-06-20 VITALS — Ht 62.0 in | Wt 178.0 lb

## 2019-06-20 DIAGNOSIS — M1712 Unilateral primary osteoarthritis, left knee: Secondary | ICD-10-CM | POA: Diagnosis not present

## 2019-06-20 DIAGNOSIS — G894 Chronic pain syndrome: Secondary | ICD-10-CM | POA: Diagnosis not present

## 2019-06-20 DIAGNOSIS — M25571 Pain in right ankle and joints of right foot: Secondary | ICD-10-CM

## 2019-06-20 DIAGNOSIS — G8929 Other chronic pain: Secondary | ICD-10-CM

## 2019-06-20 NOTE — Progress Notes (Signed)
Patient: Jean Stout  Service Category: E/M  Provider: Gillis Santa, MD  DOB: February 05, 1941  DOS: 06/20/2019  Location: Office  MRN: [id number]  Setting: Ambulatory outpatient  Referring Provider: Wayland Denis, PA-C  Type: Established Patient  Specialty: Interventional Pain Management  PCP: Wayland Denis, PA-C  Location: Home  Delivery: TeleHealth     Virtual Encounter - Pain Management PROVIDER NOTE: Information contained herein reflects review and annotations entered in association with encounter. Interpretation of such information and data should be left to medically-trained personnel. Information provided to patient can be located elsewhere in the medical record under "Patient Instructions". Document created using STT-dictation technology, any transcriptional errors that may result from process are unintentional.    Contact & Pharmacy Preferred: [phone] Home: [phone] (home) Mobile: There is no such number on file (mobile). E-mail: No e-mail address on record  [id number] [address]Phone: [phone]: [phone]  Pre-screening  Ms. [name] "in-person" vs "virtual" encounter. She indicated preferring virtual for this encounter.   Reason COVID-19*  Social distancing based on CDC and AMA recommendations.   I contacted [name] 06/20/2019 via telephone.      I clearly identified myself as BGillis Santa MD. I verified that I was speaking with the correct person using two identifiers (Name: MBetsie Stout and date of birth: 80Mar 07, 1942.  This visit was completed via telephone due to the restrictions of the COVID-19 pandemic. All issues as above were discussed and addressed but no physical exam was performed. If it was felt that the patient should be evaluated in the office, they were directed there. The patient verbally consented to this visit. Patient was unable to complete an  audio/visual visit due to Technical difficulties and/or Lack of internet. Due to the catastrophic nature of the COVID-19 pandemic, this visit was done through audio contact only.  Location of the patient: home address (see Epic for details)  Location of the provider: office  Consent I sought verbal advanced consent from MDominica Severinfor virtual visit interactions. I informed [name] possible security and privacy concerns, risks, and limitations associated with providing "not-in-person" medical evaluation and management services. I also informed [name] the availability of "in-person" appointments. Finally, I informed her that there would be a charge for the virtual visit and that she could be  personally, fully or partially, financially responsible for it. Ms. DMeyerhoffexpressed understanding and agreed to proceed.   Historic Elements   Ms. MAyanna Gheenis a 79y.o. year old, female patient evaluated today after her last contact with our practice on 06/18/2019. [name] has a past medical history of Arthritis, Coronary artery disease, DM (diabetes mellitus) (HRafael Hernandez, GERD (gastroesophageal reflux disease), HLD (hyperlipidemia), and HTN (hypertension). She also  has a past surgical history that includes Carotid stent; Joint replacement (Bilateral, 2007); and Knee surgery (Left, 2018). Ms. DSommervillehas a current medication list which includes the following prescription(s): amlodipine, aspirin ec, atorvastatin, duloxetine, hydralazine, hydrochlorothiazide, lisinopril, metformin, pantoprazole, tramadol, acetaminophen, cetirizine, docusate sodium, and ferrous sulfate. She  reports that she has never smoked. She has never used smokeless tobacco. She reports previous alcohol use. She reports that she does not use drugs. Ms. DSassoneis allergic to iodinated diagnostic agents; amlodipine besy-benazepril hcl; bextra [valdecoxib]; and gadolinium derivatives.   HPI  Today, she is being contacted for a  post-procedure assessment.   S/p LEFT knee steroid injection, helped significantly, less pain  with weight bearing.   Evaluation of last interventional procedure  05/27/2019 Procedure:   LEFT KNEE STEROID   Sedation: Please see nurses note for DOS. When no sedatives are used, the analgesic levels obtained are directly associated to the effectiveness of the local anesthetics. However, when sedation is provided, the level of analgesia obtained during the initial 1 hour following the intervention, is believed to be the result of a combination of factors. These factors may include, but are not limited to: 1. The effectiveness of the local anesthetics used. 2. The effects of the analgesic(s) and/or anxiolytic(s) used. 3. The degree of discomfort experienced by the patient at the time of the procedure. 4. The patients ability and reliability in recalling and recording the events. 5. The presence and influence of possible secondary gains and/or psychosocial factors. Reported result: Relief experienced during the 1st hour after the procedure: 80 % (Ultra-Short Term Relief)            Interpretative annotation: Clinically appropriate result. Analgesia during this period is likely to be Local Anesthetic and/or IV Sedative (Analgesic/Anxiolytic) related.          Effects of local anesthetic: The analgesic effects attained during this period are directly associated to the localized infiltration of local anesthetics and therefore cary significant diagnostic value as to the etiological location, or anatomical origin, of the pain. Expected duration of relief is directly dependent on the pharmacodynamics of the local anesthetic used. Long-acting (4-6 hours) anesthetics used.  Reported result: Relief during the next 4 to 6 hour after the procedure: 80 % (Short-Term Relief)            Interpretative annotation: Clinically appropriate result. Analgesia during this period is likely to be Local Anesthetic-related.           Long-term benefit: Defined as the period of time past the expected duration of local anesthetics (1 hour for short-acting and 4-6 hours for long-acting). With the possible exception of prolonged sympathetic blockade from the local anesthetics, benefits during this period are typically attributed to, or associated with, other factors such as analgesic sensory neuropraxia, antiinflammatory effects, or beneficial biochemical changes provided by agents other than the local anesthetics.  Reported result: Extended relief following procedure: 80 % (Long-Term Relief)            Interpretative annotation: Clinically appropriate result. Good relief.  Inflammation plays a part in the etiology to the pain.          Pharmacotherapy Assessment  Analgesic 06/14/2019  2   05/16/2019  Tramadol Hcl 50 MG Tablet  120.00  30 Bi Lat   [id number]   Nor (2541)   0  20.00 MME  Medicare   [place]    Monitoring: [place] PMP: PDMP reviewed during this encounter.       Pharmacotherapy: No side-effects or adverse reactions reported. Compliance: No problems identified. Effectiveness: Clinically acceptable. Plan: Refer to "POC".  UDS:  Summary  Date Value Ref Range Status  10/17/2018 Note  Final    Comment:    ==================================================================== Compliance Drug Analysis, Ur ==================================================================== Test                             Result       Flag       Units Drug Present and Declared for Prescription Verification   Duloxetine  PRESENT      EXPECTED   Acetaminophen                  PRESENT      EXPECTED   Naproxen                       PRESENT      EXPECTED Drug Present not Declared for Prescription Verification   Orphenadrine                   PRESENT      UNEXPECTED   Citalopram                     PRESENT      UNEXPECTED   Desmethylcitalopram            PRESENT      UNEXPECTED    Desmethylcitalopram is an expected  metabolite of citalopram or the    enantiomeric form, escitalopram. Drug Absent but Declared for Prescription Verification   Tramadol                       Not Detected UNEXPECTED ng/mg creat   Cyclobenzaprine                Not Detected UNEXPECTED   Salicylate                     Not Detected UNEXPECTED    Aspirin, as indicated in the declared medication list, is not always    detected even when used as directed. ==================================================================== Test                      Result    Flag   Units      Ref Range   Creatinine              113              mg/dL      >=20 ==================================================================== Declared Medications:  The flagging and interpretation on this report are based on the  following declared medications.  Unexpected results may arise from  inaccuracies in the declared medications.  **Note: The testing scope of this panel includes these medications:  Cyclobenzaprine (Flexeril)  Duloxetine (Cymbalta)  Naproxen (Aleve)  Tramadol (Ultram)  **Note: The testing scope of this panel does not include small to  moderate amounts of these reported medications:  Acetaminophen  Aspirin  **Note: The testing scope of this panel does not include the  following reported medications:  Amlodipine (Norvasc)  Atorvastatin (Lipitor)  Cetirizine (Zyrtec)  Docusate (Colace)  Hydralazine (Apresoline)  Hydrochlorothiazide (Hydrodiuril)  Iron  Lisinopril (Zestril)  Meloxicam (Mobic)  Metformin (Glucophage)  Pantoprazole (Protonix) ==================================================================== For clinical consultation, please call [phone]. ====================================================================    Laboratory Chemistry Profile   Renal No results found for: BUN, CREATININE, LABCREA, BCR, GFR, GFRAA, GFRNONAA, LABVMA, EPIRU, EPINEPH24HUR, NOREPRU, NOREPI24HUR, DOPARU, [id number]  Hepatic No  results found for: AST, ALT, ALBUMIN, ALKPHOS, HCVAB, AMYLASE, LIPASE, AMMONIA  Electrolytes No results found for: NA, K, CL, CALCIUM, MG, PHOS  Bone No results found for: VD25OH, [id number], TG2563SL3, TD4287GO1, 25OHVITD1, 25OHVITD2, 25OHVITD3, TESTOFREE, TESTOSTERONE  Inflammation (CRP: Acute Phase) (ESR: Chronic Phase) No results found for: CRP, ESRSEDRATE, LATICACIDVEN    Note: Above Lab results reviewed.  Imaging  DG Knee Complete 4 Views Right CLINICAL DATA:  Patient complains of right knee pain x 2 days.  Patient states she has been compensating with right knee due to left knee pain. Pain to lateral right knee. Hx of chronic right knee pain.  EXAM: RIGHT KNEE - COMPLETE 4+ VIEW  COMPARISON:  None.  FINDINGS: Mild MEDIAL compartment narrowing. No acute fracture or subluxation. No joint effusion. Atherosclerotic calcification identified in the popliteal artery.  IMPRESSION: No evidence for acute abnormality.  Electronically Signed   By: Nolon Nations M.Jean.   On: 10/23/2018 11:34  Assessment  The primary encounter diagnosis was Primary osteoarthritis of left knee. Diagnoses of Chronic pain of right ankle and Chronic pain syndrome were also pertinent to this visit.  Plan of Care  Problem-specific:  No problem-specific Assessment & Plan notes found for this encounter.  Ms. Leandra Vanderweele has a current medication list which includes the following long-term medication(s): amlodipine, atorvastatin, duloxetine, hydralazine, hydrochlorothiazide, lisinopril, metformin, pantoprazole, cetirizine, and ferrous sulfate.  Pharmacotherapy (Medications Ordered): No orders of the defined types were placed in this encounter.  Orders:  Orders Placed This Encounter  Procedures  . KNEE INJECTION    For knee pain.    Standing Status:   Standing    Number of Occurrences:   2    Standing Expiration Date:   06/19/2020    Scheduling Instructions:     Side: LEFT     Sedation: None      TIMEFRAME: PRN procedure. (Ms. Alire will call when needed.)    Order Specific Question:   Where will this procedure be performed?    Answer:   [facility]   Follow-up plan:   Return for Keep sch. appt.     Left intra-articular knee steroid injection on 05/27/2019- repeat PRN    Recent Visits Date Type Provider Dept  05/27/19 Procedure visit Gillis Santa, MD [facility]  05/16/19 Office Visit Gillis Santa, MD [facility]  Showing recent visits within past 90 days and meeting all other requirements   Today's Visits Date Type Provider Dept  06/20/19 Office Visit Gillis Santa, MD [facility]  Showing today's visits and meeting all other requirements   Future Appointments Date Type Provider Dept  07/30/19 Appointment Gillis Santa, MD [facility]  Showing future appointments within next 90 days and meeting all other requirements   I discussed the assessment and treatment plan with the patient. The patient was provided an opportunity to ask questions and all were answered. The patient agreed with the plan and demonstrated an understanding of the instructions.  Patient advised to call back or seek an in-person evaluation if the symptoms or condition worsens.  Duration of encounter: 67mnutes.  Note by: BGillis Santa MD Date: 06/20/2019; Time: 8:57 AM

## 2019-07-09 ENCOUNTER — Ambulatory Visit: Payer: Self-pay | Admitting: *Deleted

## 2019-07-09 NOTE — Telephone Encounter (Signed)
Patient is concerned because she is having lingering joint pain from COVID vaccine- it is significant and she is not sure she wants to get her next shot. Advised patient to contact her PCP- they may be able to help her with antiinflammatory treatment- she will reach out to PCP for advisement.  Reason for Disposition . COVID-19 vaccine, systemic reactions (e.g., fatigue, fever, muscle aches), questions about  Answer Assessment - Initial Assessment Questions 1. MAIN CONCERN OR SYMPTOM:  "What is your main concern right now?" "What question do you have?" "What's the main symptom you're worried about?" (e.g., fever, pain, redness, swelling)     Joint pain - knees 2. VACCINE: "What vaccination did you receive?" "Is this your first or second shot?" (e.g., none; AstraZeneca, J&J, Moderna, [employer], other)     Pfizer 3. SYMPTOM ONSET: "When did the pain begin?" (e.g., not relevant; hours, days)      Due 4/30 for second- lingered on 4. SYMPTOM SEVERITY: "How bad is it?"      Scale of 8- pain states the pain in bad 5. FEVER: "Is there a fever?" If so, ask: "What is it, how was it measured, and when did it start?"      no 6. PAST REACTIONS: "Have you reacted to immunizations before?" If so, ask: "What happened?"     no 7. OTHER SYMPTOMS: "Do you have any other symptoms?"     No other lingering symptoms  Protocols used: CORONAVIRUS (COVID-19) VACCINE QUESTIONS AND REACTIONS-A-AH

## 2019-07-23 ENCOUNTER — Telehealth: Payer: Self-pay | Admitting: *Deleted

## 2019-07-23 NOTE — Telephone Encounter (Signed)
Spoke with patient, clarified that she takes total of 90 mg tid. Dr. Cherylann Ratel notified, will sent in a new script tomorrow.

## 2019-07-23 NOTE — Telephone Encounter (Signed)
Attempted to call patient to clarify. Message left. 

## 2019-07-23 NOTE — Telephone Encounter (Signed)
Attempted to return call again, message left. 

## 2019-07-24 ENCOUNTER — Other Ambulatory Visit: Payer: Self-pay | Admitting: Student in an Organized Health Care Education/Training Program

## 2019-07-24 DIAGNOSIS — G894 Chronic pain syndrome: Secondary | ICD-10-CM

## 2019-07-24 DIAGNOSIS — F331 Major depressive disorder, recurrent, moderate: Secondary | ICD-10-CM

## 2019-07-24 MED ORDER — DULOXETINE HCL 30 MG PO CPEP: ORAL_CAPSULE | ORAL | 2 refills | Status: DC

## 2019-07-24 MED ORDER — DULOXETINE HCL 60 MG PO CPEP: ORAL_CAPSULE | ORAL | 2 refills | Status: DC

## 2019-07-30 ENCOUNTER — Ambulatory Visit
Payer: Medicare Other | Attending: Student in an Organized Health Care Education/Training Program | Admitting: Student in an Organized Health Care Education/Training Program

## 2019-07-30 ENCOUNTER — Other Ambulatory Visit: Payer: Self-pay

## 2019-07-30 ENCOUNTER — Encounter: Payer: Self-pay | Admitting: Student in an Organized Health Care Education/Training Program

## 2019-07-30 VITALS — BP 102/54 | HR 85 | Temp 98.2°F | Resp 16 | Ht 62.0 in | Wt 190.0 lb

## 2019-07-30 DIAGNOSIS — M25571 Pain in right ankle and joints of right foot: Secondary | ICD-10-CM

## 2019-07-30 DIAGNOSIS — G8929 Other chronic pain: Secondary | ICD-10-CM | POA: Diagnosis not present

## 2019-07-30 DIAGNOSIS — G894 Chronic pain syndrome: Secondary | ICD-10-CM | POA: Insufficient documentation

## 2019-07-30 DIAGNOSIS — M170 Bilateral primary osteoarthritis of knee: Secondary | ICD-10-CM | POA: Insufficient documentation

## 2019-07-30 MED ORDER — TRAMADOL HCL 50 MG PO TABS: 50.0000 mg | ORAL_TABLET | Freq: Three times a day (TID) | ORAL | 2 refills | Status: DC | PRN

## 2019-07-30 NOTE — Patient Instructions (Signed)
A prescription for Tramadol has been sent to your pharmacy. 

## 2019-07-30 NOTE — Progress Notes (Signed)
PROVIDER NOTE: Information contained herein reflects review and annotations entered in association with encounter. Interpretation of such information and data should be left to medically-trained personnel. Information provided to patient can be located elsewhere in the medical record under "Patient Instructions". Document created using STT-dictation technology, any transcriptional errors that may result from process are unintentional.    Patient: Jean Stout  Service Category: E/M  Provider: Gillis Santa, MD  DOB: Jan 26, 1941  DOS: 07/30/2019  Referring Provider: Joesphine Bare  MRN: [id number]  Setting: Ambulatory outpatient  PCP: Wayland Denis, PA-C  Type: Established Patient  Specialty: Interventional Pain Management    Location: Office  Delivery: Face-to-face     Primary Reason(s) for Visit: Encounter for prescription drug management. (Level of risk: moderate)  CC: Knee Pain (bilateral)  HPI  Jean Stout is a 79 y.o. year old, female patient, who comes today for a medication management evaluation. She has Aortic stenosis, moderate; Coronary artery disease involving native coronary artery of native heart without angina pectoris; Depression; Essential hypertension; GERD without esophagitis; Hyperlipidemia, mixed; Iron deficiency anemia; OSA on CPAP; Type 2 diabetes mellitus without complication, without long-term current use of insulin ([place]); Osteoarthritis of multiple joints; Primary osteoarthritis of left knee; Localized primary osteoarthritis of shoulder regions, bilateral; Right wrist pain; Chronic pain of right ankle; Chronic pain syndrome; Polyarthralgia; History of bilateral hip replacements; MDD (major depressive disorder), recurrent episode, moderate ([place]); GAD (generalized anxiety disorder); Panic attacks; and Insomnia due to mental condition on their problem list. Her primarily concern today is the Knee Pain (bilateral)  Pain Assessment: Location: Right, Left Knee Radiating:  denies Onset: More than a month ago Duration: Chronic pain Quality: Aching Severity: 7 /10 (subjective, self-reported pain score)  Note: Reported level is compatible with observation.                         When using our objective Pain Scale, levels between 6 and 10/10 are said to belong in an emergency room, as it progressively worsens from a 6/10, described as severely limiting, requiring emergency care not usually available at an outpatient pain management facility. At a 6/10 level, communication becomes difficult and requires great effort. Assistance to reach the emergency department may be required. Facial flushing and profuse sweating along with potentially dangerous increases in heart rate and blood pressure will be evident. Effect on ADL:   Timing: Intermittent Modifying factors: medications BP: (!) 102/54  HR: 85  Jean Stout was last scheduled for an appointment on 06/20/2019 for medication management. During today's appointment we reviewed Jean Stout's chronic pain status, as well as her outpatient medication regimen.  Patient states that she got her COVID-19 vaccine at the beginning of April but resulted in severe arthralgias.  She states that she did not get her second dose.  Other than that she is endorsing bilateral knee pain.  Patient has tried and failed genicular nerve block as well as intra-articular knee steroid injection.  She has not tried viscosupplementation.  She would like to proceed with intra-articular Hyalgan.  Risks and benefits reviewed.  Otherwise we will refill medications as below.  Given increased knee pain, will increase her tramadol so that she can take a maximum of 5 tablets/day.  This will hopefully help reduce the amount of naproxen that the patient is taking.  No further dose escalation beyond this.  The patient  reports no history of drug use. Her body mass index is 34.75 kg/m.  Further details on  both, my assessment(s), as well as the proposed treatment  plan, please see below.  Controlled Substance Pharmacotherapy Assessment REMS (Risk Evaluation and Mitigation Strategy)  Analgesic: 07/21/2019  2   05/16/2019  Tramadol Hcl 50 MG Tablet  120.00  30 Bi Lat   [id number]   [place] (2541)   1  20.00 MME  Medicare   [place]     Landis Martins, RN  07/30/2019  8:48 AM  Sign when Signing Visit Nursing Pain Medication Assessment:  Safety precautions to be maintained throughout the outpatient stay will include: orient to surroundings, keep bed in low position, maintain call bell within reach at all times, provide assistance with transfer out of bed and ambulation.  Medication Inspection Compliance: Jean Stout did not comply with our request to bring her pills to be counted. She was reminded that bringing the medication bottles, even when empty, is a requirement.  Medication: None brought in. Pill/Patch Count: None available to be counted. Bottle Appearance: No container available. Did not bring bottle(s) to appointment. Filled Date: N/A Last Medication intake:  Today   Pharmacokinetics: Liberation and absorption (onset of action): WNL Distribution (time to peak effect): WNL Metabolism and excretion (duration of action): WNL         Pharmacodynamics: Desired effects: Analgesia: Jean Stout reports >50% benefit. Functional ability: Patient reports that medication allows her to accomplish basic ADLs Clinically meaningful improvement in function (CMIF): Sustained CMIF goals met Perceived effectiveness: Described as relatively effective, allowing for increase in activities of daily living (ADL) Undesirable effects: Side-effects or Adverse reactions: None reported Monitoring: [place] PMP: PDMP reviewed during this encounter. Online review of the past 61-monthperiod conducted. Compliant with practice rules and regulations Last UDS on record: Summary  Date Value Ref Range Status  10/17/2018 Note  Final    Comment:     ==================================================================== Compliance Drug Analysis, Ur ==================================================================== Test                             Result       Flag       Units Drug Present and Declared for Prescription Verification   Duloxetine                     PRESENT      EXPECTED   Acetaminophen                  PRESENT      EXPECTED   Naproxen                       PRESENT      EXPECTED Drug Present not Declared for Prescription Verification   Orphenadrine                   PRESENT      UNEXPECTED   Citalopram                     PRESENT      UNEXPECTED   Desmethylcitalopram            PRESENT      UNEXPECTED    Desmethylcitalopram is an expected metabolite of citalopram or the    enantiomeric form, escitalopram. Drug Absent but Declared for Prescription Verification   Tramadol                       Not  Detected UNEXPECTED ng/mg creat   Cyclobenzaprine                Not Detected UNEXPECTED   Salicylate                     Not Detected UNEXPECTED    Aspirin, as indicated in the declared medication list, is not always    detected even when used as directed. ==================================================================== Test                      Result    Flag   Units      Ref Range   Creatinine              113              mg/dL      >=20 ==================================================================== Declared Medications:  The flagging and interpretation on this report are based on the  following declared medications.  Unexpected results may arise from  inaccuracies in the declared medications.  **Note: The testing scope of this panel includes these medications:  Cyclobenzaprine (Flexeril)  Duloxetine (Cymbalta)  Naproxen (Aleve)  Tramadol (Ultram)  **Note: The testing scope of this panel does not include small to  moderate amounts of these reported medications:  Acetaminophen  Aspirin  **Note: The testing  scope of this panel does not include the  following reported medications:  Amlodipine (Norvasc)  Atorvastatin (Lipitor)  Cetirizine (Zyrtec)  Docusate (Colace)  Hydralazine (Apresoline)  Hydrochlorothiazide (Hydrodiuril)  Iron  Lisinopril (Zestril)  Meloxicam (Mobic)  Metformin (Glucophage)  Pantoprazole (Protonix) ==================================================================== For clinical consultation, please call [phone]. ====================================================================    UDS interpretation: Compliant          Medication Assessment Form: Reviewed. Patient indicates being compliant with therapy Treatment compliance: Compliant Risk Assessment Profile: Aberrant behavior: See initial evaluations. None observed or detected today Comorbid factors increasing risk of overdose: See initial evaluation. No additional risks detected today Opioid risk tool (ORT):  Opioid Risk  05/27/2019  Alcohol 0  Illegal Drugs 0  Rx Drugs 0  Alcohol 0  Illegal Drugs 0  Rx Drugs 0  Age between 16-45 years  -  History of Preadolescent Sexual Abuse 0  Psychological Disease 2  ADD -  OCD Negative  Bipolar Negative  Depression 1  Opioid Risk Tool Scoring 3  Opioid Risk Interpretation Low Risk    ORT Scoring interpretation table:  Score <3 = Low Risk for SUD  Score between 4-7 = Moderate Risk for SUD  Score >8 = High Risk for Opioid Abuse   Risk of substance use disorder (SUD): Low  Risk Mitigation Strategies:  Patient Counseling: Covered Patient-Prescriber Agreement (PPA): Present and active  Notification to other healthcare providers: Done  Pharmacologic Plan: No change in therapy, at this time.             Laboratory Chemistry Profile   Renal No results found for: BUN, CREATININE, LABCREA, BCR, GFR, GFRAA, GFRNONAA, SPECGRAV, PHUR, PROTEINUR   Electrolytes No results found for: NA, K, CL, CALCIUM, MG, PHOS   Hepatic No results found for: AST, ALT,  ALBUMIN, ALKPHOS, AMYLASE, LIPASE, AMMONIA   ID Lab Results  Component Value Date   SARSCOV2NAA Not Detected 11/15/2018     Bone No results found for: VD25OH, HK327MD4JWL, KH5747BU0, ZJ0964RC3, 25OHVITD1, 25OHVITD2, 25OHVITD3, TESTOFREE, TESTOSTERONE   Endocrine No results found for: GLUCOSE, GLUCOSEU, HGBA1C, TSH, FREET4, TESTOFREE, TESTOSTERONE, SHBG, ESTRADIOL, ESTRADIOLPCT, ESTRADIOLFRE, LABPREG, ACTH, CRTSLPL, UCORFRPERLTR,  UCORFRPERDAY, CORTISOLBASE, LABPREG   Neuropathy No results found for: VITAMINB12, FOLATE, HGBA1C, HIV   CNS No results found for: COLORCSF, APPEARCSF, RBCCOUNTCSF, WBCCSF, POLYSCSF, LYMPHSCSF, EOSCSF, PROTEINCSF, GLUCCSF, JCVIRUS, CSFOLI, IGGCSF, LABACHR, ACETBL, LABACHR, ACETBL   Inflammation (CRP: Acute  ESR: Chronic) No results found for: CRP, ESRSEDRATE, LATICACIDVEN   Rheumatology No results found for: RF, ANA, LABURIC, URICUR, LYMEIGGIGMAB, LYMEABIGMQN, HLAB27   Coagulation No results found for: INR, LABPROT, APTT, PLT, DDIMER, LABHEMA, VITAMINK1, AT3   Cardiovascular No results found for: BNP, CKTOTAL, CKMB, TROPONINI, HGB, HCT, LABVMA, EPIRU, FIEPPIR51OAC, NOREPRU, NOREPI24HUR, DOPARU, [id number]   Screening Lab Results  Component Value Date   [place] Not Detected 11/15/2018     Cancer No results found for: CEA, CA125, LABCA2   Allergens No results found for: ALMOND, APPLE, ASPARAGUS, AVOCADO, BANANA, BARLEY, BASIL, BAYLE[place], GREENBEAN, LIMABEAN, WHITEBEAN, BEEFIGE, REDBEET, BLUEBERRY, BROCCOLI, CABBAGE, MELON, CARROT, CASEIN, CASHEWNUT, CAULIFLOWER, CELERY     Note: Lab results reviewed.   Recent Diagnostic Imaging Results  DG Knee Complete 4 Views Right CLINICAL DATA:  Patient complains of right knee pain x 2 days. Patient states she has been compensating with right knee due to left knee pain. Pain to lateral right knee. Hx of chronic right knee pain.  EXAM: RIGHT KNEE - COMPLETE 4+ VIEW  COMPARISON:  None.  FINDINGS: Mild  MEDIAL compartment narrowing. No acute fracture or subluxation. No joint effusion. Atherosclerotic calcification identified in the popliteal artery.  IMPRESSION: No evidence for acute abnormality.  Electronically Signed   By: Nolon Nations M.D.   On: 10/23/2018 11:34  Complexity Note: Imaging results reviewed. Results shared with Jean Stout, using Layman's terms.                               Meds   Current Outpatient Medications:  .  amLODipine (NORVASC) 10 MG tablet, Take 5 mg by mouth daily. , Disp: , Rfl:  .  aspirin EC 81 MG tablet, Take 81 mg by mouth daily. , Disp: , Rfl:  .  atorvastatin (LIPITOR) 40 MG tablet, Take 40 mg by mouth daily at 6 PM. , Disp: , Rfl:  .  DULoxetine (CYMBALTA) 30 MG capsule, 30 mg (to be taken with 60 mg for 90 mg total), Disp: 30 capsule, Rfl: 2 .  DULoxetine (CYMBALTA) 60 MG capsule, 60 mg (to be taken with 30 mg for total 90 mg), Disp: 30 capsule, Rfl: 2 .  ferrous sulfate 325 (65 FE) MG tablet, Take by mouth., Disp: , Rfl:  .  hydrALAZINE (APRESOLINE) 50 MG tablet, Take by mouth 3 (three) times daily. , Disp: , Rfl:  .  hydrochlorothiazide (HYDRODIURIL) 12.5 MG tablet, Take 12.5 mg by mouth daily. , Disp: , Rfl:  .  lisinopril (PRINIVIL,ZESTRIL) 40 MG tablet, Take 40 mg by mouth daily. , Disp: , Rfl:  .  metFORMIN (GLUCOPHAGE-XR) 500 MG 24 hr tablet, Take 500 mg by mouth daily with breakfast. , Disp: , Rfl:  .  naproxen sodium (ALEVE) 220 MG tablet, Take 220 mg by mouth daily., Disp: , Rfl:  .  pantoprazole (PROTONIX) 40 MG tablet, Take 40 mg by mouth daily. , Disp: , Rfl:  .  [START ON 09/10/2019] traMADol (ULTRAM) 50 MG tablet, Take 1-2 tablets (50-100 mg total) by mouth every 8 (eight) hours as needed. For chronic pain syndrome. Max 5 tablets/day. To last for 30 days from fill date, Disp: 150 tablet, Rfl: 2 .  Acetaminophen 500 MG coapsule, Take by mouth., Disp: , Rfl:  .  cetirizine (ZYRTEC) 10 MG tablet, Take 10 mg by mouth as needed. , Disp:  , Rfl:  .  docusate sodium (COLACE) 100 MG capsule, Take by mouth., Disp: , Rfl:   ROS  Constitutional: Denies any fever or chills Gastrointestinal: No reported hemesis, hematochezia, vomiting, or acute GI distress Musculoskeletal: Denies any acute onset joint swelling, redness, loss of ROM, or weakness Neurological: No reported episodes of acute onset apraxia, aphasia, dysarthria, agnosia, amnesia, paralysis, loss of coordination, or loss of consciousness  Allergies  Jean Stout is allergic to iodinated diagnostic agents; amlodipine besy-benazepril hcl; bextra [valdecoxib]; and gadolinium derivatives.  [place]  Drug: Jean Stout  reports no history of drug use. Alcohol:  reports previous alcohol use. Tobacco:  reports that she has never smoked. She has never used smokeless tobacco. Medical:  has a past medical history of Arthritis, Coronary artery disease, DM (diabetes mellitus) ([place]), GERD (gastroesophageal reflux disease), HLD (hyperlipidemia), and HTN (hypertension). Surgical: Jean Stout  has a past surgical history that includes Carotid stent; Joint replacement (Bilateral, 2007); and Knee surgery (Left, 2018). Family: family history includes Bipolar disorder in her sister.  Constitutional Exam  General appearance: Well nourished, well developed, and well hydrated. In no apparent acute distress Vitals:   07/30/19 0841  BP: (!) 102/54  Pulse: 85  Resp: 16  Temp: 98.2 F (36.8 C)  TempSrc: Temporal  SpO2: 99%  Weight: 190 lb (86.2 kg)  Height: '5\' 2"'  (1.575 m)   BMI Assessment: Estimated body mass index is 34.75 kg/m as calculated from the following:   Height as of this encounter: '5\' 2"'  (1.575 m).   Weight as of this encounter: 190 lb (86.2 kg).  BMI interpretation table: BMI level Category Range association with higher incidence of chronic pain  <18 kg/m2 Underweight   18.5-24.9 kg/m2 Ideal body weight   25-29.9 kg/m2 Overweight Increased incidence by 20%  30-34.9 kg/m2 Obese  (Class I) Increased incidence by 68%  35-39.9 kg/m2 Severe obesity (Class II) Increased incidence by 136%  >40 kg/m2 Extreme obesity (Class III) Increased incidence by 254%   Patient's current BMI Ideal Body weight  Body mass index is 34.75 kg/m. Ideal body weight: 50.1 kg (110 lb 7.2 oz) Adjusted ideal body weight: 64.5 kg (142 lb 4.3 oz)   BMI Readings from Last 4 Encounters:  07/30/19 34.75 kg/m  06/19/19 32.56 kg/m  05/27/19 33.84 kg/m  10/23/18 34.75 kg/m   Wt Readings from Last 4 Encounters:  07/30/19 190 lb (86.2 kg)  06/19/19 178 lb (80.7 kg)  05/27/19 185 lb (83.9 kg)  10/23/18 177 lb 14.6 oz (80.7 kg)    Psych/Mental status: Alert, oriented x 3 (person, place, & time)       Eyes: PERLA Respiratory: No evidence of acute respiratory distress  Cervical Spine Exam  Skin & Axial Inspection: No masses, redness, edema, swelling, or associated skin lesions Alignment: Symmetrical Functional ROM: Unrestricted ROM      Stability: No instability detected Muscle Tone/Strength: Functionally intact. No obvious neuro-muscular anomalies detected. Sensory (Neurological): Unimpaired Palpation: No palpable anomalies              Upper Extremity (UE) Exam    Side: Right upper extremity  Side: Left upper extremity  Skin & Extremity Inspection: Skin color, temperature, and hair growth are WNL. No peripheral edema or cyanosis. No masses, redness, swelling, asymmetry, or associated skin lesions. No contractures.  Skin & Extremity  Inspection: Skin color, temperature, and hair growth are WNL. No peripheral edema or cyanosis. No masses, redness, swelling, asymmetry, or associated skin lesions. No contractures.  Functional ROM: Unrestricted ROM          Functional ROM: Unrestricted ROM          Muscle Tone/Strength: Functionally intact. No obvious neuro-muscular anomalies detected.  Muscle Tone/Strength: Functionally intact. No obvious neuro-muscular anomalies detected.  Sensory  (Neurological): Musculoskeletal pain pattern          Sensory (Neurological): Musculoskeletal pain pattern          Palpation: No palpable anomalies              Palpation: No palpable anomalies              Provocative Test(s):  Phalen's test: deferred Tinel's test: deferred Apley's scratch test (touch opposite shoulder):  Action 1 (Across chest): deferred Action 2 (Overhead): deferred Action 3 (LB reach): deferred   Provocative Test(s):  Phalen's test: deferred Tinel's test: deferred Apley's scratch test (touch opposite shoulder):  Action 1 (Across chest): deferred Action 2 (Overhead): deferred Action 3 (LB reach): deferred    Thoracic Spine Area Exam  Skin & Axial Inspection: No masses, redness, or swelling Alignment: Symmetrical Functional ROM: Unrestricted ROM Stability: No instability detected Muscle Tone/Strength: Functionally intact. No obvious neuro-muscular anomalies detected. Sensory (Neurological): Musculoskeletal pain pattern Muscle strength & Tone: No palpable anomalies  Lumbar Exam  Skin & Axial Inspection: No masses, redness, or swelling Alignment: Symmetrical Functional ROM: Unrestricted ROM       Stability: No instability detected Muscle Tone/Strength: Functionally intact. No obvious neuro-muscular anomalies detected. Sensory (Neurological): Musculoskeletal pain pattern   Gait & Posture Assessment  Ambulation: Unassisted Gait: Relatively normal for age and body habitus Posture: WNL   Lower Extremity Exam    Side: Right lower extremity  Side: Left lower extremity  Stability: No instability observed          Stability: No instability observed          Skin & Extremity Inspection: Skin color, temperature, and hair growth are WNL. No peripheral edema or cyanosis. No masses, redness, swelling, asymmetry, or associated skin lesions. No contractures.  Skin & Extremity Inspection: Skin color, temperature, and hair growth are WNL. No peripheral edema or cyanosis.  No masses, redness, swelling, asymmetry, or associated skin lesions. No contractures.  Functional ROM: Pain restricted ROM for hip and knee joints          Functional ROM: Pain restricted ROM for hip and knee joints          Muscle Tone/Strength: Functionally intact. No obvious neuro-muscular anomalies detected.  Muscle Tone/Strength: Functionally intact. No obvious neuro-muscular anomalies detected.  Sensory (Neurological): Unimpaired        Sensory (Neurological): Unimpaired        DTR: Patellar: deferred today Achilles: deferred today Plantar: deferred today  DTR: Patellar: deferred today Achilles: deferred today Plantar: deferred today  Palpation: No palpable anomalies  Palpation: No palpable anomalies   Assessment   Status Diagnosis  Having a Flare-up Persistent Controlled 1. Bilateral primary osteoarthritis of knee   2. Chronic pain syndrome   3. Chronic pain of right ankle      1. Chronic pain syndrome - traMADol (ULTRAM) 50 MG tablet; Take 1-2 tablets (50-100 mg total) by mouth every 8 (eight) hours as needed. For chronic pain syndrome. Max 5 tablets/day. To last for 30 days from fill date  Dispense: 150 tablet;  Refill: 2  2. Bilateral primary osteoarthritis of knee - KNEE INJECTION; Future intra-articular Hyalgan bilaterally.  Has failed genicular nerve block and intra-articular steroid injection.  3. Chronic pain of right ankle -Naproxen as needed, will hopefully reduce intake since we are increasing her tramadol to 5 tablets a day from 4.  Plan of Care  Pharmacotherapy (Medications Ordered): Meds ordered this encounter  Medications  . traMADol (ULTRAM) 50 MG tablet    Sig: Take 1-2 tablets (50-100 mg total) by mouth every 8 (eight) hours as needed. For chronic pain syndrome. Max 5 tablets/day. To last for 30 days from fill date    Dispense:  150 tablet    Refill:  2    For chronic pain syndrome   Medications administered today: Aiyanah Kalama had no medications  administered during this visit.  Orders:  Orders Placed This Encounter  Procedures  . KNEE INJECTION    Hyalgan knee injection. Please order Hyalgan.    Standing Status:   Future    Standing Expiration Date:   08/30/2019    Scheduling Instructions:     Procedure: Intra-articular Hyalgan Knee injection            Side: Bilateral     Sedation: None     Timeframe: in two (2) weeks    Order Specific Question:   Where will this procedure be performed?    Answer:   [facility]    Planned follow-up:   Return in about 2 weeks (around 08/13/2019) for B/L Hyalgan .     Left intra-articular knee steroid injection on 05/27/2019- repeat PRN     Recent Visits Date Type Provider Dept  06/20/19 Office Visit Gillis Santa, MD [facility]  05/27/19 Procedure visit Gillis Santa, MD [facility]  05/16/19 Office Visit Gillis Santa, MD [facility]  Showing recent visits within past 90 days and meeting all other requirements   Today's Visits Date Type Provider Dept  07/30/19 Office Visit Gillis Santa, MD [facility]  Showing today's visits and meeting all other requirements   Future Appointments Date Type Provider Dept  08/14/19 Appointment Gillis Santa, MD [facility]  Showing future appointments within next 90 days and meeting all other requirements   Primary Care Physician: Wayland Denis, PA-C Location: [facility] Note by: Gillis Santa, MD Date: 07/30/2019; Time: 10:35 AM  Note: This dictation was prepared with Dragon dictation. Any transcriptional errors that may result from this process are unintentional.

## 2019-07-30 NOTE — Progress Notes (Signed)
Nursing Pain Medication Assessment:  Safety precautions to be maintained throughout the outpatient stay will include: orient to surroundings, keep bed in low position, maintain call bell within reach at all times, provide assistance with transfer out of bed and ambulation.  Medication Inspection Compliance: Ms. Hopf did not comply with our request to bring her pills to be counted. She was reminded that bringing the medication bottles, even when empty, is a requirement.  Medication: None brought in. Pill/Patch Count: None available to be counted. Bottle Appearance: No container available. Did not bring bottle(s) to appointment. Filled Date: N/A Last Medication intake:  Today

## 2019-08-02 DIAGNOSIS — I129 Hypertensive chronic kidney disease with stage 1 through stage 4 chronic kidney disease, or unspecified chronic kidney disease: Secondary | ICD-10-CM | POA: Insufficient documentation

## 2019-08-02 DIAGNOSIS — N189 Chronic kidney disease, unspecified: Secondary | ICD-10-CM | POA: Insufficient documentation

## 2019-08-02 DIAGNOSIS — D631 Anemia in chronic kidney disease: Secondary | ICD-10-CM | POA: Insufficient documentation

## 2019-08-02 DIAGNOSIS — N1831 Chronic kidney disease, stage 3a: Secondary | ICD-10-CM | POA: Insufficient documentation

## 2019-08-08 ENCOUNTER — Other Ambulatory Visit: Payer: Self-pay | Admitting: Nephrology

## 2019-08-08 DIAGNOSIS — N189 Chronic kidney disease, unspecified: Secondary | ICD-10-CM

## 2019-08-12 ENCOUNTER — Ambulatory Visit
Admission: RE | Admit: 2019-08-12 | Discharge: 2019-08-12 | Disposition: A | Discharge status: Home / Self Care | Payer: Medicare Other | Source: Ambulatory Visit | Attending: Nephrology | Admitting: Nephrology

## 2019-08-12 ENCOUNTER — Other Ambulatory Visit: Payer: Self-pay

## 2019-08-12 DIAGNOSIS — N189 Chronic kidney disease, unspecified: Secondary | ICD-10-CM | POA: Insufficient documentation

## 2019-08-14 ENCOUNTER — Ambulatory Visit: Payer: Medicare Other | Admitting: Student in an Organized Health Care Education/Training Program

## 2019-08-17 ENCOUNTER — Other Ambulatory Visit: Payer: Self-pay | Admitting: Student in an Organized Health Care Education/Training Program

## 2019-08-21 ENCOUNTER — Ambulatory Visit
Payer: Medicare Other | Attending: Student in an Organized Health Care Education/Training Program | Admitting: Student in an Organized Health Care Education/Training Program

## 2019-08-21 ENCOUNTER — Encounter: Payer: Self-pay | Admitting: Student in an Organized Health Care Education/Training Program

## 2019-08-21 ENCOUNTER — Other Ambulatory Visit: Payer: Self-pay

## 2019-08-21 VITALS — BP 152/81 | HR 67 | Temp 97.3°F | Resp 18 | Ht 62.0 in | Wt 187.6 lb

## 2019-08-21 DIAGNOSIS — M170 Bilateral primary osteoarthritis of knee: Secondary | ICD-10-CM | POA: Diagnosis not present

## 2019-08-21 MED ORDER — METHYLPREDNISOLONE ACETATE 40 MG/ML IJ SUSP
40.0000 mg | Freq: Once | INTRAMUSCULAR | Status: AC
  Administered 2019-08-21: 40 mg via INTRA_ARTICULAR

## 2019-08-21 MED ORDER — SODIUM HYALURONATE (VISCOSUP) 20 MG/2ML IX SOSY: 2.0000 mL | PREFILLED_SYRINGE | Freq: Once | INTRA_ARTICULAR | Status: DC

## 2019-08-21 MED ORDER — METHYLPREDNISOLONE ACETATE 40 MG/ML IJ SUSP
INTRAMUSCULAR | Status: AC
  Filled 2019-08-21: qty 2

## 2019-08-21 MED ORDER — LIDOCAINE HCL (PF) 1 % IJ SOLN
5.0000 mL | Freq: Once | INTRAMUSCULAR | Status: AC
  Administered 2019-08-21: 5 mL
  Filled 2019-08-21: qty 5

## 2019-08-21 MED ORDER — ROPIVACAINE HCL 2 MG/ML IJ SOLN
INTRAMUSCULAR | Status: AC
  Filled 2019-08-21: qty 10

## 2019-08-21 MED ORDER — ROPIVACAINE HCL 2 MG/ML IJ SOLN
9.0000 mL | Freq: Once | INTRAMUSCULAR | Status: AC
  Administered 2019-08-21: 9 mL

## 2019-08-21 NOTE — Progress Notes (Signed)
Safety precautions to be maintained throughout the outpatient stay will include: orient to surroundings, keep bed in low position, maintain call bell within reach at all times, provide assistance with transfer out of bed and ambulation.  

## 2019-08-21 NOTE — Progress Notes (Signed)
PROVIDER NOTE: Information contained herein reflects review and annotations entered in association with encounter. Interpretation of such information and data should be left to medically-trained personnel. Information provided to patient can be located elsewhere in the medical record under "Patient Instructions". Document created using STT-dictation technology, any transcriptional errors that may result from process are unintentional.    Patient: Jean Stout  Service Category: Procedure  Provider: Edward Jolly, MD  DOB: 16-Apr-1940  DOS: 08/21/2019  Location: [facility]  MRN: [id number]  Setting: Ambulatory - outpatient  Referring Provider: Carren Rang, PA-C  Type: Established Patient  Specialty: Interventional Pain Management  PCP: Carren Rang, PA-C   Primary Reason for Visit: Interventional Pain Management Treatment. CC: Knee Pain (bilateral)  Procedure:          Anesthesia, Analgesia, Anxiolysis:  Type: Therapeutic Intra-Articular Local anesthetic and steroid Knee Injection #2  Region: Medial infrapatellar Knee Region Level: Knee Joint Laterality: Bilateral  Type: Local Anesthesia Indication(s): Analgesia         Local Anesthetic: Lidocaine 1-2% Route: Infiltration (/IM) IV Access: Declined Sedation: Declined   Position: Sitting   Indications: 1. Bilateral primary osteoarthritis of knee    Pain Score: Pre-procedure: 8 /10 Post-procedure: 0-No pain/10   Pre-op Assessment:  Jean Stout is a 79 y.o. (year old), female patient, seen today for interventional treatment. She  has a past surgical history that includes Carotid stent; Joint replacement (Bilateral, 2007); and Knee surgery (Left, 2018). Jean Stout has a current medication list which includes the following prescription(s): acetaminophen, amlodipine, aspirin ec, atorvastatin, docusate sodium, duloxetine, duloxetine, ferrous sulfate, hydralazine, hydrochlorothiazide, lisinopril, metformin, naproxen  sodium, pantoprazole, [START ON 09/10/2019] tramadol, and cetirizine. Her primarily concern today is the Knee Pain (bilateral)  Initial Vital Signs:  Pulse/HCG Rate: 67  Temp: (!) 97.3 F (36.3 C) Resp: 18 BP: (!) 152/81 SpO2: 99 %  BMI: Estimated body mass index is 34.31 kg/m as calculated from the following:   Height as of this encounter: 5\' 2"  (1.575 m).   Weight as of this encounter: 187 lb 9.6 oz (85.1 kg).  Risk Assessment: Allergies: Reviewed. She is allergic to iodinated diagnostic agents; amlodipine besy-benazepril hcl; bextra [valdecoxib]; and gadolinium derivatives.  Allergy Precautions: None required Coagulopathies: Reviewed. None identified.  Blood-thinner therapy: None at this time Active Infection(s): Reviewed. None identified. Ms. Rapaport is afebrile  Site Confirmation: Jean Stout was asked to confirm the procedure and laterality before marking the site Procedure checklist: Completed Consent: Before the procedure and under the influence of no sedative(s), amnesic(s), or anxiolytics, the patient was informed of the treatment options, risks and possible complications. To fulfill our ethical and legal obligations, as recommended by the American Medical Association's Code of Ethics, I have informed the patient of my clinical impression; the nature and purpose of the treatment or procedure; the risks, benefits, and possible complications of the intervention; the alternatives, including doing nothing; the risk(s) and benefit(s) of the alternative treatment(s) or procedure(s); and the risk(s) and benefit(s) of doing nothing. The patient was provided information about the general risks and possible complications associated with the procedure. These may include, but are not limited to: failure to achieve desired goals, infection, bleeding, organ or nerve damage, allergic reactions, paralysis, and death. In addition, the patient was informed of those risks and complications associated to  the procedure, such as failure to decrease pain; infection; bleeding; organ or nerve damage with subsequent damage to sensory, motor, and/or autonomic systems, resulting in permanent pain, numbness, and/or weakness  of one or several areas of the body; allergic reactions; (i.e.: anaphylactic reaction); and/or death. Furthermore, the patient was informed of those risks and complications associated with the medications. These include, but are not limited to: allergic reactions (i.e.: anaphylactic or anaphylactoid reaction(s)); adrenal axis suppression; blood sugar elevation that in diabetics may result in ketoacidosis or comma; water retention that in patients with history of congestive heart failure may result in shortness of breath, pulmonary edema, and decompensation with resultant heart failure; weight gain; swelling or edema; medication-induced neural toxicity; particulate matter embolism and blood vessel occlusion with resultant organ, and/or nervous system infarction; and/or aseptic necrosis of one or more joints. Finally, the patient was informed that Medicine is not an exact science; therefore, there is also the possibility of unforeseen or unpredictable risks and/or possible complications that may result in a catastrophic outcome. The patient indicated having understood very clearly. We have given the patient no guarantees and we have made no promises. Enough time was given to the patient to ask questions, all of which were answered to the patient's satisfaction. Jean Stout has indicated that she wanted to continue with the procedure. Attestation: I, the ordering provider, attest that I have discussed with the patient the benefits, risks, side-effects, alternatives, likelihood of achieving goals, and potential problems during recovery for the procedure that I have provided informed consent. Date  Time: 08/21/2019 10:20 AM  Pre-Procedure Preparation:  Monitoring: As per clinic protocol. Respiration,  ETCO2, SpO2, BP, heart rate and rhythm monitor placed and checked for adequate function Safety Precautions: Patient was assessed for positional comfort and pressure points before starting the procedure. Time-out: I initiated and conducted the "Time-out" before starting the procedure, as per protocol. The patient was asked to participate by confirming the accuracy of the "Time Out" information. Verification of the correct person, site, and procedure were performed and confirmed by me, the nursing staff, and the patient. "Time-out" conducted as per Joint Commission's Universal Protocol (UP.01.01.01). Time: 1105  Description of Procedure:          Target Area: Knee Joint Approach: Just above the Medial tibial plateau, lateral to the infrapatellar tendon. Area Prepped: Entire knee area, from the mid-thigh to the mid-shin. Prepping solution: DuraPrep (Iodine Povacrylex [0.7% available iodine] and Isopropyl Alcohol, 74% w/w) Safety Precautions: Aspiration looking for blood return was conducted prior to all injections. At no point did we inject any substances, as a needle was being advanced. No attempts were made at seeking any paresthesias. Safe injection practices and needle disposal techniques used. Medications properly checked for expiration dates. SDV (single dose vial) medications used. Description of the Procedure: Protocol guidelines were followed. The patient was placed in position over the fluoroscopy table. The target area was identified and the area prepped in the usual manner. Skin & deeper tissues infiltrated with local anesthetic. Appropriate amount of time allowed to pass for local anesthetics to take effect. The procedure needles were then advanced to the target area. Proper needle placement secured. Negative aspiration confirmed. Solution injected in intermittent fashion, asking for systemic symptoms every 0.5cc of injectate. The needles were then removed and the area cleansed, making sure to  leave some of the prepping solution back to take advantage of its long term bactericidal properties. Vitals:   08/21/19 1027  BP: (!) 152/81  Pulse: 67  Resp: 18  Temp: (!) 97.3 F (36.3 C)  TempSrc: Temporal  SpO2: 99%  Weight: 187 lb 9.6 oz (85.1 kg)  Height: 5\' 2"  (1.575 m)  Start Time: 1105 hrs. End Time: 1115 hrs. Materials:  Needle(s) Type: Regular needle Gauge: 25G Length: 1.5-in Medication(s): Please see orders for medications and dosing details. 4 cc solution made of 3 cc of 0.2% ropivacaine, 1 cc of methylprednisolone, 40 mg/cc injected for right knee 4 cc solution made of 3 cc of 0.2% ropivacaine, 1 cc of methylprednisolone, 40 mg/cc injected for left knee Imaging Guidance:          Type of Imaging Technique: None used Indication(s): N/A Exposure Time: No patient exposure Contrast: None used. Fluoroscopic Guidance: N/A Ultrasound Guidance: N/A Interpretation: N/A  Antibiotic Prophylaxis:   Anti-infectives (From admission, onward)   None     Indication(s): None identified  Post-operative Assessment:  Post-procedure Vital Signs:  Pulse/HCG Rate: 67  Temp: (!) 97.3 F (36.3 C) Resp: 18 BP: (!) 152/81 SpO2: 99 %  EBL: None  Complications: No immediate post-treatment complications observed by team, or reported by patient.  Note: The patient tolerated the entire procedure well. A repeat set of vitals were taken after the procedure and the patient was kept under observation following institutional policy, for this type of procedure. Post-procedural neurological assessment was performed, showing return to baseline, prior to discharge. The patient was provided with post-procedure discharge instructions, including a section on how to identify potential problems. Should any problems arise concerning this procedure, the patient was given instructions to immediately contact us, at any time, without hesitation. In any case, we plan to contact the patient by  telephone for a follow-up status report regarding this interventional procedure.  Comments:  No additional relevant information.  Plan of Care   Medications ordered for procedure: Meds ordered this encounter  Medications  . DISCONTD: Sodium Hyaluronate SOSY 2 mL  . DISCONTD: Sodium Hyaluronate SOSY 2 mL  . lidocaine (PF) (XYLOCAINE) 1 % injection 5 mL  . methylPREDNISolone acetate (DEPO-MEDROL) injection 40 mg  . methylPREDNISolone acetate (DEPO-MEDROL) injection 40 mg  . ropivacaine (PF) 2 mg/mL (0.2%) (NAROPIN) injection 9 mL   Medications administered: We administered lidocaine (PF), methylPREDNISolone acetate, methylPREDNISolone acetate, and ropivacaine (PF) 2 mg/mL (0.2%).  See the medical record for exact dosing, route, and time of administration.  Follow-up plan:   Return in about 4 weeks (around 09/18/2019) for Post Procedure Evaluation, virtual.      Left intra-articular knee steroid injection on 05/27/2019, bilateral knee steroid injection 08/21/2019.  Has previously had 5 series of viscosupplementation at outside clinic.  States it was not effective.  Status post genicular nerve block which was not effective.  Can consider sprint PNS in future   Recent Visits Date Type Provider Dept  07/30/19 Office Visit Gillis Santa, MD [facility]  06/20/19 Office Visit Gillis Santa, MD [facility]  05/27/19 Procedure visit Gillis Santa, MD [facility]  Showing recent visits within past 90 days and meeting all other requirements   Today's Visits Date Type Provider Dept  08/21/19 Procedure visit Gillis Santa, MD [facility]  Showing today's visits and meeting all other requirements   Future Appointments Date Type Provider Dept  09/18/19 Appointment Gillis Santa, MD [facility]  Showing future appointments within next 90 days and meeting all other requirements   Disposition: Discharge home  Discharge (Date  Time): 08/21/2019; 1113  hrs.   Primary Care Physician: Wayland Denis, PA-C Location: [facility] Note by: Gillis Santa, MD Date: 08/21/2019; Time: 3:17 PM  Disclaimer:  Medicine is not an exact science. The only  guarantee in medicine is that nothing is guaranteed. It is important to note that the decision to proceed with this intervention was based on the information collected from the patient. The Data and conclusions were drawn from the patient's questionnaire, the interview, and the physical examination. Because the information was provided in large part by the patient, it cannot be guaranteed that it has not been purposely or unconsciously manipulated. Every effort has been made to obtain as much relevant data as possible for this evaluation. It is important to note that the conclusions that lead to this procedure are derived in large part from the available data. Always take into account that the treatment will also be dependent on availability of resources and existing treatment guidelines, considered by other Pain Management Practitioners as being common knowledge and practice, at the time of the intervention. For Medico-Legal purposes, it is also important to point out that variation in procedural techniques and pharmacological choices are the acceptable norm. The indications, contraindications, technique, and results of the above procedure should only be interpreted and judged by a Board-Certified Interventional Pain Specialist with extensive familiarity and expertise in the same exact procedure and technique.

## 2019-08-22 ENCOUNTER — Telehealth: Payer: Self-pay

## 2019-08-22 NOTE — Telephone Encounter (Signed)
Pt was called and no problems reported. 

## 2019-09-11 DIAGNOSIS — N2581 Secondary hyperparathyroidism of renal origin: Secondary | ICD-10-CM | POA: Insufficient documentation

## 2019-09-17 ENCOUNTER — Encounter: Payer: Self-pay | Admitting: Student in an Organized Health Care Education/Training Program

## 2019-09-18 ENCOUNTER — Encounter: Payer: Self-pay | Admitting: Student in an Organized Health Care Education/Training Program

## 2019-09-18 ENCOUNTER — Other Ambulatory Visit: Payer: Self-pay

## 2019-09-18 ENCOUNTER — Ambulatory Visit
Payer: Medicare Other | Attending: Student in an Organized Health Care Education/Training Program | Admitting: Student in an Organized Health Care Education/Training Program

## 2019-09-18 DIAGNOSIS — F411 Generalized anxiety disorder: Secondary | ICD-10-CM

## 2019-09-18 DIAGNOSIS — M170 Bilateral primary osteoarthritis of knee: Secondary | ICD-10-CM | POA: Diagnosis not present

## 2019-09-18 DIAGNOSIS — M1712 Unilateral primary osteoarthritis, left knee: Secondary | ICD-10-CM

## 2019-09-18 DIAGNOSIS — G8929 Other chronic pain: Secondary | ICD-10-CM

## 2019-09-18 DIAGNOSIS — M25571 Pain in right ankle and joints of right foot: Secondary | ICD-10-CM | POA: Diagnosis not present

## 2019-09-18 DIAGNOSIS — G894 Chronic pain syndrome: Secondary | ICD-10-CM

## 2019-09-18 DIAGNOSIS — M19012 Primary osteoarthritis, left shoulder: Secondary | ICD-10-CM

## 2019-09-18 DIAGNOSIS — M19011 Primary osteoarthritis, right shoulder: Secondary | ICD-10-CM

## 2019-09-18 NOTE — Progress Notes (Signed)
Patient: Swannie Milius  Service Category: E/M  Provider: Edward Jolly, MD  DOB: 1940/12/21  DOS: 09/18/2019  Location: Office  MRN: [id number]  Setting: Ambulatory outpatient  Referring Provider: Carren Rang, PA-C  Type: Established Patient  Specialty: Interventional Pain Management  PCP: Carren Rang, PA-C  Location: Home  Delivery: TeleHealth     Virtual Encounter - Pain Management PROVIDER NOTE: Information contained herein reflects review and annotations entered in association with encounter. Interpretation of such information and data should be left to medically-trained personnel. Information provided to patient can be located elsewhere in the medical record under "Patient Instructions". Document created using STT-dictation technology, any transcriptional errors that may result from process are unintentional.    Contact & Pharmacy Preferred: [phone] Home: [phone] (home) Mobile: [phone] (mobile) E-mail: No e-mail address on record  [id number] [place], [place] [address] [place] [address] Phone: [phone] Fax: [phone]   Pre-screening  Ms. Fallaw offered "in-person" vs "virtual" encounter. She indicated preferring virtual for this encounter.   Reason COVID-19*  Social distancing based on CDC and AMA recommendations.   I contacted Skyrah Krupp on 09/18/2019 via video conference.      I clearly identified myself as Edward Jolly, MD. I verified that I was speaking with the correct person using two identifiers (Name: Adryan Druckenmiller, and date of birth: June 13, 1940).  Consent I sought verbal advanced consent from Roxanne Mins for virtual visit interactions. I informed Ms. Lemma of possible security and privacy concerns, risks, and limitations associated with providing "not-in-person" medical evaluation and management services. I also informed Ms. Critzer of the availability of "in-person" appointments. Finally, I informed  her that there would be a charge for the virtual visit and that she could be  personally, fully or partially, financially responsible for it. Ms. Coury expressed understanding and agreed to proceed.   Historic Elements   Ms. Aariyana Manz is a 79 y.o. year old, female patient evaluated today after her last contact with our practice on 08/22/2019. Ms. Haberkorn  has a past medical history of Arthritis, Coronary artery disease, DM (diabetes mellitus) (HCC), GERD (gastroesophageal reflux disease), HLD (hyperlipidemia), and HTN (hypertension). She also  has a past surgical history that includes Carotid stent; Joint replacement (Bilateral, 2007); and Knee surgery (Left, 2018). Ms. Blomquist has a current medication list which includes the following prescription(s): acetaminophen, amlodipine, aspirin ec, atorvastatin, docusate sodium, duloxetine, duloxetine, ferrous sulfate, hydralazine, hydrochlorothiazide, lisinopril, metformin, naproxen sodium, pantoprazole, tramadol, and cetirizine. She  reports that she has never smoked. She has never used smokeless tobacco. She reports previous alcohol use. She reports that she does not use drugs. Ms. Capshaw is allergic to iodinated diagnostic agents, amlodipine besy-benazepril hcl, bextra [valdecoxib], and gadolinium derivatives.   HPI  Today, she is being contacted for a post-procedure assessment.   Post-Procedure Evaluation  Procedure:   Type: Therapeutic Intra-Articular Local anesthetic and steroid Knee Injection #2  Region: Medial infrapatellar Knee Region Level: Knee Joint Laterality: Bilateral  Sedation: Please see nurses note.  Effectiveness during initial hour after procedure(Ultra-Short Term Relief): 100 %   Local anesthetic used: Long-acting (4-6 hours) Effectiveness: Defined as any analgesic benefit obtained secondary to the administration of local anesthetics. This carries significant diagnostic value as to the etiological location, or anatomical origin, of  the pain. Duration of benefit is expected to coincide with the duration of the local anesthetic used.  Effectiveness during initial 4-6 hours after procedure(Short-Term Relief): 100 %   Long-term benefit: Defined as  any relief past the pharmacologic duration of the local anesthetics.  Effectiveness past the initial 6 hours after procedure(Long-Term Relief): 50 %   Current benefits: Defined as benefit that persist at this time.   Analgesia:  50% improved Function: Ms. Langwell reports improvement in function ROM: Ms. Farabaugh reports improvement in ROM   Imaging  US RENAL CLINICAL DATA:  Chronic kidney disease  EXAM: RENAL / URINARY TRACT ULTRASOUND COMPLETE  COMPARISON:  None.  FINDINGS: Right Kidney:  Renal measurements: 10.0 x 3.9 x 5.0 cm = volume: 102.6 mL . Echogenicity and renal cortical thickness are within normal limits. No mass, perinephric fluid, or hydronephrosis visualized. No sonographically demonstrable calculus or ureterectasis.  Left Kidney:  Renal measurements: 9.8 x 5.5 x 5.0 cm = volume: 140.6 mL. Echogenicity and renal cortical thickness are within normal limits. No mass, perinephric fluid, or hydronephrosis visualized. No sonographically demonstrable calculus or ureterectasis.  Bladder:  Appears normal for degree of bladder distention.  Other:  None.  IMPRESSION: Study within normal limits.  Electronically Signed   By: Lowella Grip III M.D.   On: 08/12/2019 13:03  Assessment  The primary encounter diagnosis was Bilateral primary osteoarthritis of knee. Diagnoses of Chronic pain of right ankle, Primary osteoarthritis of left knee, Localized primary osteoarthritis of shoulder regions, bilateral, GAD (generalized anxiety disorder), and Chronic pain syndrome were also pertinent to this visit.  Plan of Care   Ms. Lynsee Wands has a current medication list which includes the following long-term medication(s): amlodipine, atorvastatin,  duloxetine, duloxetine, ferrous sulfate, hydralazine, hydrochlorothiazide, lisinopril, metformin, pantoprazole, and cetirizine.  Patient is endorsing significant analgesic and functional benefit after her bilateral intra-articular steroid injection #2 performed at the end of May.  She endorses less pain with ambulation and feels that she is able to complete ADLs more comfortably.  Repeat as needed.  Continue medication management appointment for September. Orders:  Orders Placed This Encounter  Procedures  . KNEE INJECTION    For knee pain.    Standing Status:   Standing    Number of Occurrences:   2    Standing Expiration Date:   03/19/2020    Scheduling Instructions:     Side: Bilateral     Sedation: None     TIMEFRAME: PRN procedure. (Ms. Abair will call when needed.)    Order Specific Question:   Where will this procedure be performed?    Answer:   [facility]   Follow-up plan:   Return for PRN Knee steroid injection.     Left intra-articular knee steroid injection on 05/27/2019, bilateral knee steroid injection 08/21/2019.  Has previously had 5 series of viscosupplementation at outside clinic.  States it was not effective.  Status post genicular nerve block which was not effective.  Can consider sprint PNS in future    Recent Visits Date Type Provider Dept  08/21/19 Procedure visit Gillis Santa, MD [facility]  07/30/19 Office Visit Gillis Santa, MD [facility]  06/20/19 Office Visit Gillis Santa, MD [facility]  Showing recent visits within past 90 days and meeting all other requirements Today's Visits Date Type Provider Dept  09/18/19 Office Visit Gillis Santa, MD [facility]  Showing today's visits and meeting all other requirements Future Appointments Date Type Provider Dept  11/28/19 Appointment Gillis Santa, MD [facility]  Showing future appointments within next 90 days and meeting all other requirements  I  discussed the assessment and treatment plan with the patient. The  patient was provided an opportunity to ask questions and all were answered. The patient agreed with the plan and demonstrated an understanding of the instructions.  Patient advised to call back or seek an in-person evaluation if the symptoms or condition worsens.  Duration of encounter: .  Note by: Edward Jolly, MD Date: 09/18/2019; Time: 9:37 AM

## 2019-10-20 ENCOUNTER — Other Ambulatory Visit: Payer: Self-pay | Admitting: Student in an Organized Health Care Education/Training Program

## 2019-10-20 DIAGNOSIS — G894 Chronic pain syndrome: Secondary | ICD-10-CM

## 2019-10-22 ENCOUNTER — Telehealth: Payer: Self-pay | Admitting: Student in an Organized Health Care Education/Training Program

## 2019-10-22 ENCOUNTER — Other Ambulatory Visit: Payer: Self-pay | Admitting: Student in an Organized Health Care Education/Training Program

## 2019-10-22 NOTE — Telephone Encounter (Signed)
Pt left voicemail requesting refill of tramadol. She has a [facility] appt 9/2.

## 2019-10-22 NOTE — Telephone Encounter (Signed)
Called and talked with patient. She states that at her last appt she was told to increase her medications to 5 pills a day.  She takes 2 pills at 530 am, 2 pills at 1400 and 1 pill pm. She has a weeks worth and will be out before her next [facility] aptt on 9/2. Would like a refill.

## 2019-10-23 NOTE — Telephone Encounter (Signed)
I am sorry . Idi not see that and she had stated she had no refills. Will let her know.

## 2019-10-23 NOTE — Telephone Encounter (Signed)
Called and notified pt that she had refills at the pharm.

## 2019-10-29 ENCOUNTER — Ambulatory Visit
Admission: RE | Admit: 2019-10-29 | Discharge: 2019-10-29 | Disposition: A | Discharge status: Home / Self Care | Payer: Medicare Other | Source: Ambulatory Visit | Attending: Student | Admitting: Student

## 2019-10-29 ENCOUNTER — Other Ambulatory Visit: Payer: Self-pay

## 2019-10-29 ENCOUNTER — Other Ambulatory Visit (HOSPITAL_COMMUNITY): Payer: Self-pay | Admitting: Student

## 2019-10-29 ENCOUNTER — Other Ambulatory Visit: Payer: Self-pay | Admitting: Student

## 2019-10-29 DIAGNOSIS — M7989 Other specified soft tissue disorders: Secondary | ICD-10-CM | POA: Diagnosis not present

## 2019-11-04 ENCOUNTER — Telehealth: Payer: Self-pay | Admitting: *Deleted

## 2019-11-04 NOTE — Telephone Encounter (Signed)
Called to speak with patient, no answer and no voicemail capability.

## 2019-11-13 ENCOUNTER — Other Ambulatory Visit: Payer: Self-pay

## 2019-11-13 ENCOUNTER — Ambulatory Visit
Payer: Medicare Other | Attending: Student in an Organized Health Care Education/Training Program | Admitting: Student in an Organized Health Care Education/Training Program

## 2019-11-13 ENCOUNTER — Encounter: Payer: Self-pay | Admitting: Student in an Organized Health Care Education/Training Program

## 2019-11-13 VITALS — BP 135/60 | HR 64 | Temp 98.1°F | Resp 18 | Ht 62.0 in | Wt 183.0 lb

## 2019-11-13 DIAGNOSIS — G894 Chronic pain syndrome: Secondary | ICD-10-CM | POA: Insufficient documentation

## 2019-11-13 DIAGNOSIS — M170 Bilateral primary osteoarthritis of knee: Secondary | ICD-10-CM | POA: Diagnosis present

## 2019-11-13 MED ORDER — METHYLPREDNISOLONE ACETATE 40 MG/ML IJ SUSP
40.0000 mg | Freq: Once | INTRAMUSCULAR | Status: AC
  Administered 2019-11-13: 40 mg via INTRA_ARTICULAR

## 2019-11-13 MED ORDER — METHYLPREDNISOLONE ACETATE 40 MG/ML IJ SUSP
INTRAMUSCULAR | Status: AC
  Filled 2019-11-13: qty 2

## 2019-11-13 MED ORDER — ROPIVACAINE HCL 2 MG/ML IJ SOLN
9.0000 mL | Freq: Once | INTRAMUSCULAR | Status: AC
  Administered 2019-11-13: 9 mL via PERINEURAL

## 2019-11-13 MED ORDER — LIDOCAINE HCL 2 % IJ SOLN
INTRAMUSCULAR | Status: AC
  Filled 2019-11-13: qty 20

## 2019-11-13 MED ORDER — LIDOCAINE HCL 2 % IJ SOLN
20.0000 mL | Freq: Once | INTRAMUSCULAR | Status: AC
  Administered 2019-11-13: 400 mg

## 2019-11-13 MED ORDER — ROPIVACAINE HCL 2 MG/ML IJ SOLN
INTRAMUSCULAR | Status: AC
  Filled 2019-11-13: qty 10

## 2019-11-13 NOTE — Progress Notes (Signed)
PROVIDER NOTE: Information contained herein reflects review and annotations entered in association with encounter. Interpretation of such information and data should be left to medically-trained personnel. Information provided to patient can be located elsewhere in the medical record under "Patient Instructions". Document created using STT-dictation technology, any transcriptional errors that may result from process are unintentional.    Patient: Jean Stout  Service Category: Procedure  Provider: Edward Jolly, MD  DOB: April 02, 1940  DOS: 11/13/2019  Location: [facility]  MRN: [id number]  Setting: Ambulatory - outpatient  Referring Provider: Carren Rang, PA-C  Type: Established Patient  Specialty: Interventional Pain Management  PCP: Carren Rang, PA-C   Primary Reason for Visit: Interventional Pain Management Treatment. CC: Knee Pain (bilateral)  Procedure:          Anesthesia, Analgesia, Anxiolysis:  Type: Therapeutic Intra-Articular Local anesthetic and steroid Knee Injection #3  Region: Medial infrapatellar Knee Region Level: Knee Joint Laterality: Bilateral  Type: Local Anesthesia Indication(s): Analgesia         Local Anesthetic: Lidocaine 1-2% Route: Infiltration ([place]/IM) IV Access: Declined Sedation: Declined   Position: Sitting   Indications: 1. Bilateral primary osteoarthritis of knee   2. Chronic pain syndrome    Pain Score: Pre-procedure: 4 /10 Post-procedure: 4 /10   Pre-op Assessment:  Jean Stout is a 79 y.o. (year old), female patient, seen today for interventional treatment. She  has a past surgical history that includes Carotid stent; Joint replacement (Bilateral, 2007); and Knee surgery (Left, 2018). Jean Stout has a current medication list which includes the following prescription(s): acetaminophen, amlodipine, aspirin ec, atorvastatin, cetirizine, docusate sodium, duloxetine, duloxetine, ferrous sulfate, hydralazine, hydrochlorothiazide,  lisinopril, metformin, naproxen sodium, and pantoprazole. Her primarily concern today is the Knee Pain (bilateral)  Initial Vital Signs:  Pulse/HCG Rate: 64  Temp: 98.1 F (36.7 C) Resp: 18 BP: 135/60 SpO2: 99 %  BMI: Estimated body mass index is 33.47 kg/m as calculated from the following:   Height as of this encounter: 5\' 2"  (1.575 m).   Weight as of this encounter: 183 lb (83 kg).  Risk Assessment: Allergies: Reviewed. She is allergic to iodinated diagnostic agents, amlodipine besy-benazepril hcl, bextra [valdecoxib], and gadolinium derivatives.  Allergy Precautions: None required Coagulopathies: Reviewed. None identified.  Blood-thinner therapy: None at this time Active Infection(s): Reviewed. None identified. Jean Stout is afebrile  Site Confirmation: Jean Stout was asked to confirm the procedure and laterality before marking the site Procedure checklist: Completed Consent: Before the procedure and under the influence of no sedative(s), amnesic(s), or anxiolytics, the patient was informed of the treatment options, risks and possible complications. To fulfill our ethical and legal obligations, as recommended by the American Medical Association's Code of Ethics, I have informed the patient of my clinical impression; the nature and purpose of the treatment or procedure; the risks, benefits, and possible complications of the intervention; the alternatives, including doing nothing; the risk(s) and benefit(s) of the alternative treatment(s) or procedure(s); and the risk(s) and benefit(s) of doing nothing. The patient was provided information about the general risks and possible complications associated with the procedure. These may include, but are not limited to: failure to achieve desired goals, infection, bleeding, organ or nerve damage, allergic reactions, paralysis, and death. In addition, the patient was informed of those risks and complications associated to the procedure, such as  failure to decrease pain; infection; bleeding; organ or nerve damage with subsequent damage to sensory, motor, and/or autonomic systems, resulting in permanent pain, numbness, and/or weakness of one  or several areas of the body; allergic reactions; (i.e.: anaphylactic reaction); and/or death. Furthermore, the patient was informed of those risks and complications associated with the medications. These include, but are not limited to: allergic reactions (i.e.: anaphylactic or anaphylactoid reaction(s)); adrenal axis suppression; blood sugar elevation that in diabetics may result in ketoacidosis or comma; water retention that in patients with history of congestive heart failure may result in shortness of breath, pulmonary edema, and decompensation with resultant heart failure; weight gain; swelling or edema; medication-induced neural toxicity; particulate matter embolism and blood vessel occlusion with resultant organ, and/or nervous system infarction; and/or aseptic necrosis of one or more joints. Finally, the patient was informed that Medicine is not an exact science; therefore, there is also the possibility of unforeseen or unpredictable risks and/or possible complications that may result in a catastrophic outcome. The patient indicated having understood very clearly. We have given the patient no guarantees and we have made no promises. Enough time was given to the patient to ask questions, all of which were answered to the patient's satisfaction. Jean Stout has indicated that she wanted to continue with the procedure. Attestation: I, the ordering provider, attest that I have discussed with the patient the benefits, risks, side-effects, alternatives, likelihood of achieving goals, and potential problems during recovery for the procedure that I have provided informed consent. Date  Time: 11/13/2019 11:45 AM  Pre-Procedure Preparation:  Monitoring: As per clinic protocol. Respiration, ETCO2, SpO2, BP, heart rate  and rhythm monitor placed and checked for adequate function Safety Precautions: Patient was assessed for positional comfort and pressure points before starting the procedure. Time-out: I initiated and conducted the "Time-out" before starting the procedure, as per protocol. The patient was asked to participate by confirming the accuracy of the "Time Out" information. Verification of the correct person, site, and procedure were performed and confirmed by me, the nursing staff, and the patient. "Time-out" conducted as per Joint Commission's Universal Protocol (UP.01.01.01). Time: 1201  Description of Procedure:          Target Area: Knee Joint Approach: Just above the Medial tibial plateau, lateral to the infrapatellar tendon. Area Prepped: Entire knee area, from the mid-thigh to the mid-shin. Prepping solution: DuraPrep (Iodine Povacrylex [0.7% available iodine] and Isopropyl Alcohol, 74% w/w) Safety Precautions: Aspiration looking for blood return was conducted prior to all injections. At no point did we inject any substances, as a needle was being advanced. No attempts were made at seeking any paresthesias. Safe injection practices and needle disposal techniques used. Medications properly checked for expiration dates. SDV (single dose vial) medications used. Description of the Procedure: Protocol guidelines were followed. The patient was placed in position over the fluoroscopy table. The target area was identified and the area prepped in the usual manner. Skin & deeper tissues infiltrated with local anesthetic. Appropriate amount of time allowed to pass for local anesthetics to take effect. The procedure needles were then advanced to the target area. Proper needle placement secured. Negative aspiration confirmed. Solution injected in intermittent fashion, asking for systemic symptoms every 0.5cc of injectate. The needles were then removed and the area cleansed, making sure to leave some of the prepping  solution back to take advantage of its long term bactericidal properties. Vitals:   11/13/19 1148  BP: 135/60  Pulse: 64  Resp: 18  Temp: 98.1 F (36.7 C)  TempSrc: Oral  SpO2: 99%  Weight: 183 lb (83 kg)  Height: 5\' 2"  (1.575 m)    Start Time: 1201  hrs. End Time: 1206 hrs. Materials:  Needle(s) Type: Regular needle Gauge: 25G Length: 1.5-in Medication(s): Please see orders for medications and dosing details. 4 cc solution made of 3 cc of 0.2% ropivacaine, 1 cc of methylprednisolone, 40 mg/cc injected for right knee 4 cc solution made of 3 cc of 0.2% ropivacaine, 1 cc of methylprednisolone, 40 mg/cc injected for left knee Imaging Guidance:          Type of Imaging Technique: None used Indication(s): N/A Exposure Time: No patient exposure Contrast: None used. Fluoroscopic Guidance: N/A Ultrasound Guidance: N/A Interpretation: N/A  Antibiotic Prophylaxis:   Anti-infectives (From admission, onward)   None     Indication(s): None identified  Post-operative Assessment:  Post-procedure Vital Signs:  Pulse/HCG Rate: 64  Temp: 98.1 F (36.7 C) Resp: 18 BP: 135/60 SpO2: 99 %  EBL: None  Complications: No immediate post-treatment complications observed by team, or reported by patient.  Note: The patient tolerated the entire procedure well. A repeat set of vitals were taken after the procedure and the patient was kept under observation following institutional policy, for this type of procedure. Post-procedural neurological assessment was performed, showing return to baseline, prior to discharge. The patient was provided with post-procedure discharge instructions, including a section on how to identify potential problems. Should any problems arise concerning this procedure, the patient was given instructions to immediately contact us, at any time, without hesitation. In any case, we plan to contact the patient by telephone for a follow-up status report regarding this  interventional procedure.  Comments:  No additional relevant information.  Plan of Care   Medications ordered for procedure: Meds ordered this encounter  Medications  . lidocaine (XYLOCAINE) 2 % (with pres) injection 400 mg  . methylPREDNISolone acetate (DEPO-MEDROL) injection 40 mg  . methylPREDNISolone acetate (DEPO-MEDROL) injection 40 mg  . ropivacaine (PF) 2 mg/mL (0.2%) (NAROPIN) injection 9 mL   Medications administered: We administered lidocaine, methylPREDNISolone acetate, methylPREDNISolone acetate, and ropivacaine (PF) 2 mg/mL (0.2%).  See the medical record for exact dosing, route, and time of administration.  Follow-up plan:   Return in about 6 weeks (around 12/25/2019) for Post Procedure Evaluation, virtual.      Left intra-articular knee steroid injection on 05/27/2019, bilateral knee steroid injection 08/21/2019, 11/13/19.  Has previously had 5 series of viscosupplementation at outside clinic.  States it was not effective.  Status post genicular nerve block which was not effective.  Can consider sprint PNS in future   Recent Visits Date Type Provider Dept  09/18/19 Office Visit Edward JollyLateef, Kurt Azimi, MD [facility]  08/21/19 Procedure visit Edward JollyLateef, Hero Mccathern, MD [facility]  Showing recent visits within past 90 days and meeting all other requirements Today's Visits Date Type Provider Dept  11/13/19 Procedure visit Edward JollyLateef, Joette Schmoker, MD [facility]  Showing today's visits and meeting all other requirements Future Appointments Date Type Provider Dept  12/24/19 Appointment Edward JollyLateef, Kanyon Seibold, MD [facility]  Showing future appointments within next 90 days and meeting all other requirements  Disposition: Discharge home  Discharge (Date  Time): 11/13/2019; 1215 hrs.   Primary Care Physician: Carren Rangarter, Danielle, PA-C [facility] Note by: Edward JollyBilal Blythe Hartshorn, MD Date: 11/13/2019; Time: 1:49 PM  Disclaimer:  Medicine is  not an exact science. The only guarantee in medicine is that nothing is guaranteed. It is important to note that the decision to proceed with this intervention was based on the information collected from the patient. The Data and conclusions were  drawn from the patient's questionnaire, the interview, and the physical examination. Because the information was provided in large part by the patient, it cannot be guaranteed that it has not been purposely or unconsciously manipulated. Every effort has been made to obtain as much relevant data as possible for this evaluation. It is important to note that the conclusions that lead to this procedure are derived in large part from the available data. Always take into account that the treatment will also be dependent on availability of resources and existing treatment guidelines, considered by other Pain Management Practitioners as being common knowledge and practice, at the time of the intervention. For Medico-Legal purposes, it is also important to point out that variation in procedural techniques and pharmacological choices are the acceptable norm. The indications, contraindications, technique, and results of the above procedure should only be interpreted and judged by a Board-Certified Interventional Pain Specialist with extensive familiarity and expertise in the same exact procedure and technique.

## 2019-11-13 NOTE — Patient Instructions (Signed)

## 2019-11-13 NOTE — Progress Notes (Signed)
Safety precautions to be maintained throughout the outpatient stay will include: orient to surroundings, keep bed in low position, maintain call bell within reach at all times, provide assistance with transfer out of bed and ambulation.  

## 2019-11-14 ENCOUNTER — Telehealth: Payer: Self-pay | Admitting: *Deleted

## 2019-11-14 NOTE — Telephone Encounter (Signed)
No problems post procedure. 

## 2019-11-18 ENCOUNTER — Telehealth: Payer: Self-pay | Admitting: Student in an Organized Health Care Education/Training Program

## 2019-11-18 DIAGNOSIS — G894 Chronic pain syndrome: Secondary | ICD-10-CM

## 2019-11-18 DIAGNOSIS — F331 Major depressive disorder, recurrent, moderate: Secondary | ICD-10-CM

## 2019-11-18 MED ORDER — DULOXETINE HCL 60 MG PO CPEP: ORAL_CAPSULE | ORAL | 0 refills | Status: DC

## 2019-11-18 MED ORDER — DULOXETINE HCL 30 MG PO CPEP: ORAL_CAPSULE | ORAL | 0 refills | Status: DC

## 2019-11-18 NOTE — Telephone Encounter (Signed)
Patient states she need 90 supply of both 30mg  and 60mg  duloxitine. She states her meds were sent in wrong. Please check and call patient

## 2019-11-18 NOTE — Addendum Note (Signed)
Addended by: Edward Jolly on: 11/18/2019 03:56 PM   Modules accepted: Orders

## 2019-11-18 NOTE — Telephone Encounter (Signed)
Can you fix this for this patient?  Please and Thank you

## 2019-11-19 ENCOUNTER — Telehealth: Payer: Self-pay

## 2019-11-19 NOTE — Telephone Encounter (Signed)
Patient notified

## 2019-11-28 ENCOUNTER — Encounter: Payer: Medicare Other | Admitting: Student in an Organized Health Care Education/Training Program

## 2019-12-24 ENCOUNTER — Ambulatory Visit
Payer: Medicare Other | Attending: Student in an Organized Health Care Education/Training Program | Admitting: Student in an Organized Health Care Education/Training Program

## 2019-12-24 ENCOUNTER — Encounter: Payer: Self-pay | Admitting: Student in an Organized Health Care Education/Training Program

## 2019-12-24 ENCOUNTER — Other Ambulatory Visit: Payer: Self-pay

## 2019-12-24 DIAGNOSIS — F411 Generalized anxiety disorder: Secondary | ICD-10-CM

## 2019-12-24 DIAGNOSIS — M1712 Unilateral primary osteoarthritis, left knee: Secondary | ICD-10-CM

## 2019-12-24 DIAGNOSIS — M25571 Pain in right ankle and joints of right foot: Secondary | ICD-10-CM | POA: Diagnosis not present

## 2019-12-24 DIAGNOSIS — M170 Bilateral primary osteoarthritis of knee: Secondary | ICD-10-CM | POA: Diagnosis not present

## 2019-12-24 DIAGNOSIS — F331 Major depressive disorder, recurrent, moderate: Secondary | ICD-10-CM

## 2019-12-24 DIAGNOSIS — G894 Chronic pain syndrome: Secondary | ICD-10-CM

## 2019-12-24 DIAGNOSIS — Z96643 Presence of artificial hip joint, bilateral: Secondary | ICD-10-CM

## 2019-12-24 DIAGNOSIS — M19011 Primary osteoarthritis, right shoulder: Secondary | ICD-10-CM | POA: Diagnosis not present

## 2019-12-24 DIAGNOSIS — M19012 Primary osteoarthritis, left shoulder: Secondary | ICD-10-CM

## 2019-12-24 DIAGNOSIS — G8929 Other chronic pain: Secondary | ICD-10-CM

## 2019-12-24 MED ORDER — DULOXETINE HCL 30 MG PO CPEP: ORAL_CAPSULE | ORAL | 0 refills | Status: DC

## 2019-12-24 MED ORDER — DULOXETINE HCL 60 MG PO CPEP: ORAL_CAPSULE | ORAL | 0 refills | Status: DC

## 2019-12-24 MED ORDER — TRAMADOL HCL 50 MG PO TABS: 50.0000 mg | ORAL_TABLET | ORAL | 2 refills | Status: DC | PRN

## 2019-12-24 NOTE — Progress Notes (Signed)
Patient: Jean Stout  Service Category: E/M  Provider: Gillis Santa, MD  DOB: [birth year]  DOS: 12/24/2019  Location: Office  MRN: [id number]  Setting: Ambulatory outpatient  Referring Provider: Wayland Denis, PA-C  Type: Established Patient  Specialty: Interventional Pain Management  PCP: Wayland Denis, PA-C  Location: Home  Delivery: TeleHealth     Virtual Encounter - Pain Management PROVIDER NOTE: Information contained herein reflects review and annotations entered in association with encounter. Interpretation of such information and data should be left to medically-trained personnel. Information provided to patient can be located elsewhere in the medical record under "Patient Instructions". Document created using STT-dictation technology, any transcriptional errors that may result from process are unintentional.    Contact & Pharmacy Preferred: [phone] Home: [phone] (home) Mobile: There is no such number on file (mobile). E-mail: No e-mail address on record  [id number]Phone: [phone]: [phone]  Pre-screening  Ms. DVinaloffered "in-person" vs "virtual" encounter. She indicated preferring virtual for this encounter.   Reason COVID-19*  Social distancing based on CDC and AMA recommendations.   I contacted MSiddhi Dornbushon 12/24/2019 via video conference.      I clearly identified myself as BGillis Santa MD. I verified that I was speaking with the correct person using two identifiers (Name: Jean Stout and date of birth: 101/03/1941.  Consent I sought verbal advanced consent from MDominica Severinfor virtual visit interactions. I informed Ms. DFretwellof possible security and privacy concerns, risks, and limitations associated with providing "not-in-person" medical evaluation and management services. I also informed Ms. DDorffof the availability of "in-person" appointments.  Finally, I informed her that there would be a charge for the virtual visit and that she could be  personally, fully or partially, financially responsible for it. Ms. DLandersexpressed understanding and agreed to proceed.   Historic Elements   Ms. MAsmaa Tirpakis a 79y.o. year old, female patient evaluated today after our last contact on 11/18/2019. [name] has a past medical history of Arthritis, Coronary artery disease, DM (diabetes mellitus) ([place], GERD (gastroesophageal reflux disease), HLD (hyperlipidemia), and HTN (hypertension). She also  has a past surgical history that includes Carotid stent; Joint replacement (Bilateral, 2007); and Knee surgery (Left, 2018). Ms. DScharfhas a current medication list which includes the following prescription(s): amlodipine, aspirin ec, atorvastatin, diclofenac sodium, docusate sodium, duloxetine, duloxetine, hydralazine, hydrochlorothiazide, lisinopril, metformin, pantoprazole, tramadol, and cetirizine. She  reports that she has never smoked. She has never used smokeless tobacco. She reports previous alcohol use. She reports that she does not use drugs. Ms. DMuckleis allergic to iodinated diagnostic agents, amlodipine besy-benazepril hcl, bextra [valdecoxib], and gadolinium derivatives.   HPI  Today, she is being contacted for both, medication management and a post-procedure assessment.  Post-Procedure Evaluation  Procedure (11/18/2019):   Type: Therapeutic Intra-Articular Local anesthetic and steroid Knee Injection #3  Region: Medial infrapatellar Knee Region Level: Knee Joint Laterality: Bilateral  Sedation: Please see nurses note.  Effectiveness during initial hour after procedure(Ultra-Short Term Relief): 100 %   Local anesthetic used: Long-acting (4-6 hours) Effectiveness: Defined as any analgesic benefit obtained secondary to the administration of local anesthetics. This carries significant diagnostic value as to the etiological location, or  anatomical origin, of the pain. Duration of benefit is expected to coincide with the duration of the local anesthetic used.  Effectiveness during initial 4-6 hours after procedure(Short-Term Relief): 100 %  Long-term benefit: Defined as any relief past the pharmacologic duration of the local anesthetics.  Effectiveness past the initial 6 hours after procedure(Long-Term Relief): 80 %.  Current benefits: Defined as benefit that persist at this time.   Analgesia:  >75% relief Function: Somewhat improved ROM: Jean Stout reports improvement in ROM   Pharmacotherapy Assessment   Analgesic: Tramadol 50 mg every 4 hours as needed, quantity 150/month maximum 5 tablets a day; MME equals 25   Monitoring: [place] PMP: PDMP reviewed during this encounter.       Pharmacotherapy: No side-effects or adverse reactions reported. Compliance: No problems identified. Effectiveness: Clinically acceptable.  UDS:  Summary  Date Value Ref Range Status  10/17/2018 Note  Final    Comment:    ==================================================================== Compliance Drug Analysis, Ur ==================================================================== Test                             Result       Flag       Units Drug Present and Declared for Prescription Verification   Duloxetine                     PRESENT      EXPECTED   Acetaminophen                  PRESENT      EXPECTED   Naproxen                       PRESENT      EXPECTED Drug Present not Declared for Prescription Verification   Orphenadrine                   PRESENT      UNEXPECTED   Citalopram                     PRESENT      UNEXPECTED   Desmethylcitalopram            PRESENT      UNEXPECTED    Desmethylcitalopram is an expected metabolite of citalopram or the    enantiomeric form, escitalopram. Drug Absent but Declared for Prescription Verification   Tramadol                       Not Detected UNEXPECTED ng/mg creat   Cyclobenzaprine                 Not Detected UNEXPECTED   Salicylate                     Not Detected UNEXPECTED    Aspirin, as indicated in the declared medication list, is not always    detected even when used as directed. ==================================================================== Test                      Result    Flag   Units      Ref Range   Creatinine              113              mg/dL      >=20 ==================================================================== Declared Medications:  The flagging and interpretation on this report are based on the  following declared medications.  Unexpected results may arise from  inaccuracies in the declared medications.  **Note: The testing scope of  this panel includes these medications:  Cyclobenzaprine (Flexeril)  Duloxetine (Cymbalta)  Naproxen (Aleve)  Tramadol (Ultram)  **Note: The testing scope of this panel does not include small to  moderate amounts of these reported medications:  Acetaminophen  Aspirin  **Note: The testing scope of this panel does not include the  following reported medications:  Amlodipine (Norvasc)  Atorvastatin (Lipitor)  Cetirizine (Zyrtec)  Docusate (Colace)  Hydralazine (Apresoline)  Hydrochlorothiazide (Hydrodiuril)  Iron  Lisinopril (Zestril)  Meloxicam (Mobic)  Metformin (Glucophage)  Pantoprazole (Protonix) ==================================================================== For clinical consultation, please call [phone]. ====================================================================        Laboratory Chemistry Profile   Renal No results found for: BUN, CREATININE, LABCREA, BCR, GFR, GFRAA, GFRNONAA, LABVMA, EPIRU, EPINEPH24HUR, NOREPRU, NOREPI24HUR, DOPARU, [id number]   Hepatic No results found for: AST, ALT, ALBUMIN, ALKPHOS, HCVAB, AMYLASE, LIPASE, AMMONIA   Electrolytes No results found for: NA, K, CL, CALCIUM, MG, PHOS   Bone No results found for: VD25OH, [id number], [id number],  [id number], 25OHVITD1, 25OHVITD2, 25OHVITD3, TESTOFREE, TESTOSTERONE   Inflammation (CRP: Acute Phase) (ESR: Chronic Phase) No results found for: CRP, ESRSEDRATE, LATICACIDVEN     Note: Above Lab results reviewed.  Imaging  US Venous Img Lower Unilateral Left (DVT) CLINICAL DATA:  Pain and swelling  EXAM: LEFT LOWER EXTREMITY VENOUS DOPPLER ULTRASOUND  TECHNIQUE: Gray-scale sonography with compression, as well as color and duplex ultrasound, were performed to evaluate the deep venous system(s) from the level of the common femoral vein through the popliteal and proximal calf veins.  COMPARISON:  None.  FINDINGS: VENOUS  Normal compressibility of the common femoral, superficial femoral, and popliteal veins, as well as the visualized calf veins. Visualized portions of profunda femoral vein and great saphenous vein unremarkable. No filling defects to suggest DVT on grayscale or color Doppler imaging. Doppler waveforms show normal direction of venous flow, normal respiratory plasticity and response to augmentation.  Limited views of the contralateral common femoral vein are unremarkable.  OTHER  None.  Limitations: none  IMPRESSION: Negative.  Electronically Signed   By: Constance Holster M.D.   On: 10/29/2019 16:43  Assessment  The primary encounter diagnosis was Bilateral primary osteoarthritis of knee. Diagnoses of Primary osteoarthritis of left knee, Chronic pain of right ankle, Localized primary osteoarthritis of shoulder regions, bilateral, GAD (generalized anxiety disorder), History of bilateral hip replacements, Chronic pain syndrome, and MDD (major depressive disorder), recurrent episode, moderate (HCC) were also pertinent to this visit.  Plan of Care   Jean Stout has a current medication list which includes the following long-term medication(s): amlodipine, atorvastatin, duloxetine, duloxetine, hydralazine, hydrochlorothiazide, lisinopril, metformin,  pantoprazole, tramadol, and cetirizine.  Patient is doing well from a chronic pain standpoint.  She is experiencing significant pain relief after her bilateral knee steroid injections for knee osteoarthritis.  We can repeat these every 3 to 4 months.  As needed order placed as below.  Continue multimodal regimen with Cymbalta 90 mg daily along with tramadol 50 mg every 4 hours as needed, maximum 5 tablets a day, quantity 150/month.  Patient states that she is finding better pain relief at this increased tramadol frequency along with the intra-articular knee steroid injections.  She states that she is able to bear more weight on both knees.  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  She is planning on moving to a retirement facility.  Refill medications for the next 3 months as below.  Will need  UDS at next visit.  As needed order placed for bilateral intra-articular knee steroid injections.  Pharmacotherapy (Medications Ordered): Meds ordered this encounter  Medications  . DULoxetine (CYMBALTA) 60 MG capsule    Sig: 60 mg (to be taken with 30 mg for total 90 mg)    Dispense:  90 capsule    Refill:  0  . DULoxetine (CYMBALTA) 30 MG capsule    Sig: 30 mg (to be taken with 60 mg for 90 mg total)    Dispense:  90 capsule    Refill:  0  . traMADol (ULTRAM) 50 MG tablet    Sig: Take 1 tablet (50 mg total) by mouth every 4 (four) hours as needed. For chronic pain syndrome.  Maximum 5 tablets/day.  Each prescription to last 30 days.    Dispense:  150 tablet    Refill:  2   Orders:  Orders Placed This Encounter  Procedures  . KNEE INJECTION    For knee pain.    Standing Status:   Standing    Number of Occurrences:   2    Standing Expiration Date:   12/23/2020    Scheduling Instructions:     Side: Bilateral     Sedation: None     TIMEFRAME: PRN procedure. (Jean Stout will call when needed.)    Order Specific Question:   Where will  this procedure be performed?    Answer:   [facility]   Follow-up plan:   Return in about 3 months (around 03/24/2020) for Medication Management, in person, (UDS).     Left intra-articular knee steroid injection on 05/27/2019, bilateral knee steroid injection 08/21/2019, 11/13/19.  Has previously had 5 series of viscosupplementation at outside clinic.  States it was not effective.  Status post genicular nerve block which was not effective.  Can consider sprint PNS in future    Recent Visits Date Type Provider Dept  11/13/19 Procedure visit Gillis Santa, MD [facility]  Showing recent visits within past 90 days and meeting all other requirements Today's Visits Date Type Provider Dept  12/24/19 Telemedicine Gillis Santa, MD [facility]  Showing today's visits and meeting all other requirements Future Appointments No visits were found meeting these conditions. Showing future appointments within next 90 days and meeting all other requirements  I discussed the assessment and treatment plan with the patient. The patient was provided an opportunity to ask questions and all were answered. The patient agreed with the plan and demonstrated an understanding of the instructions.  Patient advised to call back or seek an in-person evaluation if the symptoms or condition worsens.  Duration of encounter: 30 minutes.  Note by: Gillis Santa, MD Date: 12/24/2019; Time: 2:34 PM

## 2020-01-15 ENCOUNTER — Encounter: Payer: Self-pay | Admitting: Student in an Organized Health Care Education/Training Program

## 2020-01-16 ENCOUNTER — Ambulatory Visit
Payer: Medicare Other | Attending: Student in an Organized Health Care Education/Training Program | Admitting: Student in an Organized Health Care Education/Training Program

## 2020-01-16 ENCOUNTER — Encounter: Payer: Self-pay | Admitting: Student in an Organized Health Care Education/Training Program

## 2020-01-16 ENCOUNTER — Other Ambulatory Visit: Payer: Self-pay

## 2020-01-16 DIAGNOSIS — M1712 Unilateral primary osteoarthritis, left knee: Secondary | ICD-10-CM | POA: Diagnosis not present

## 2020-01-16 DIAGNOSIS — M170 Bilateral primary osteoarthritis of knee: Secondary | ICD-10-CM | POA: Diagnosis not present

## 2020-01-16 DIAGNOSIS — G894 Chronic pain syndrome: Secondary | ICD-10-CM

## 2020-01-16 NOTE — Progress Notes (Signed)
Patient: Jean Stout  Service Category: E/M  Provider: Gillis Santa, MD  DOB: 10-10-1940  DOS: 01/16/2020  Location: Office  MRN: [id number]  Setting: Ambulatory outpatient  Referring Provider: Wayland Denis, PA-C  Type: Established Patient  Specialty: Interventional Pain Management  PCP: Wayland Denis, PA-C  Location: Home  Delivery: TeleHealth     Virtual Encounter - Pain Management PROVIDER NOTE: Information contained herein reflects review and annotations entered in association with encounter. Interpretation of such information and data should be left to medically-trained personnel. Information provided to patient can be located elsewhere in the medical record under "Patient Instructions". Document created using STT-dictation technology, any transcriptional errors that may result from process are unintentional.    Contact & Pharmacy Preferred: [phone] Home: [phone] (home) Mobile: There is no such number on file (mobile). E-mail: No e-mail address on record  [id number] [facility]Phone: [phone]: [phone]  Pre-screening  Jean Stout "in-person" vs "virtual" encounter. She indicated preferring virtual for this encounter.   Reason COVID-19*  Social distancing based on CDC and AMA recommendations.   I contacted Jean Fahrneron 01/16/2020 via telephone.      I clearly identified myself as BGillis Santa MD. I verified that I was speaking with the correct person using two identifiers (Name: MElese Stout and date of birth: 1November 05, 1942.  Consent I sought verbal advanced consent from MDominica Severinfor virtual visit interactions. I informed Ms. DSeaseof possible security and privacy concerns, risks, and limitations associated with providing "not-in-person" medical evaluation and management services. I also informed [name] the availability of "in-person" appointments.  Finally, I informed her that there would be a charge for the virtual visit and that she could be  personally, fully or partially, financially responsible for it. Ms. DEmleyexpressed understanding and agreed to proceed.   Historic Elements   Ms. Jean Sittonis a 79y.o. year old, female patient evaluated today after our last contact on 11/18/2019. Jean Stout has a past medical history of Arthritis, Coronary artery disease, DM (diabetes mellitus) ([place], GERD (gastroesophageal reflux disease), HLD (hyperlipidemia), and HTN (hypertension). She also  has a past surgical history that includes Carotid stent; Joint replacement (Bilateral, 2007); and Knee surgery (Left, 2018). [name] a current medication list which includes the following prescription(s): amlodipine, aspirin ec, atorvastatin, diclofenac sodium, docusate sodium, duloxetine, duloxetine, famotidine, hydralazine, hydrochlorothiazide, lisinopril, metformin, omeprazole, tramadol, cetirizine, and pantoprazole. She  reports that she has never smoked. She has never used smokeless tobacco. She reports previous alcohol use. She reports that she does not use drugs. Jean Stout allergic to iodinated diagnostic agents, amlodipine besy-benazepril hcl, bextra [valdecoxib], and gadolinium derivatives.   HPI  Today, she is being contacted for worsening of previously known (established) problem  Complaining of worsening bilateral knee pain.  Status post intra-articular knee steroid injection on 11/13/2019 which provided her with pain relief.  This was her third one  for the year.  Patient is requesting another injection for bilateral knee pain.  I informed her that we can do one additional steroid injection this year.  Pharmacotherapy Assessment  Analgesic: Tramadol 50 mg every 4 hours as needed, quantity 150/month maximum 5 tablets a day; MME equals 25   Monitoring: [place] PMP: PDMP reviewed during this encounter.       Pharmacotherapy: No side-effects or  adverse reactions reported. Compliance: No problems identified. Effectiveness: Clinically acceptable. Plan: Refer to "  POC".  UDS:  Summary  Date Value Ref Range Status  10/17/2018 Note  Final    Comment:    ==================================================================== Compliance Drug Analysis, Ur ==================================================================== Test                             Result       Flag       Units Drug Present and Declared for Prescription Verification   Duloxetine                     PRESENT      EXPECTED   Acetaminophen                  PRESENT      EXPECTED   Naproxen                       PRESENT      EXPECTED Drug Present not Declared for Prescription Verification   Orphenadrine                   PRESENT      UNEXPECTED   Citalopram                     PRESENT      UNEXPECTED   Desmethylcitalopram            PRESENT      UNEXPECTED    Desmethylcitalopram is an expected metabolite of citalopram or the    enantiomeric form, escitalopram. Drug Absent but Declared for Prescription Verification   Tramadol                       Not Detected UNEXPECTED ng/mg creat   Cyclobenzaprine                Not Detected UNEXPECTED   Salicylate                     Not Detected UNEXPECTED    Aspirin, as indicated in the declared medication list, is not always    detected even when used as directed. ==================================================================== Test                      Result    Flag   Units      Ref Range   Creatinine              113              mg/dL      >=20 ==================================================================== Declared Medications:  The flagging and interpretation on this report are based on the  following declared medications.  Unexpected results may arise from  inaccuracies in the declared medications.  **Note: The testing scope of this panel includes these medications:  Cyclobenzaprine (Flexeril)  Duloxetine  (Cymbalta)  Naproxen (Aleve)  Tramadol (Ultram)  **Note: The testing scope of this panel does not include small to  moderate amounts of these reported medications:  Acetaminophen  Aspirin  **Note: The testing scope of this panel does not include the  following reported medications:  Amlodipine (Norvasc)  Atorvastatin (Lipitor)  Cetirizine (Zyrtec)  Docusate (Colace)  Hydralazine (Apresoline)  Hydrochlorothiazide (Hydrodiuril)  Iron  Lisinopril (Zestril)  Meloxicam (Mobic)  Metformin (Glucophage)  Pantoprazole (Protonix) ==================================================================== For clinical consultation, please call [phone]. ====================================================================     Laboratory Chemistry Profile   Renal  No results found for: BUN, CREATININE, LABCREA, BCR, GFR, GFRAA, GFRNONAA, LABVMA, EPIRU, EPINEPH24HUR, NOREPRU, NOREPI24HUR, DOPARU, [id number]   Hepatic No results found for: AST, ALT, ALBUMIN, ALKPHOS, HCVAB, AMYLASE, LIPASE, AMMONIA   Electrolytes No results found for: NA, K, CL, CALCIUM, MG, PHOS   Bone No results found for: VD25OH, [id number], [id number], [id number], 25OHVITD1, 25OHVITD2, 25OHVITD3, TESTOFREE, TESTOSTERONE   Inflammation (CRP: Acute Phase) (ESR: Chronic Phase) No results found for: CRP, ESRSEDRATE, LATICACIDVEN     Note: Above Lab results reviewed.  Imaging  US Venous Img Lower Unilateral Left (DVT) CLINICAL DATA:  Pain and swelling  EXAM: LEFT LOWER EXTREMITY VENOUS DOPPLER ULTRASOUND  TECHNIQUE: Gray-scale sonography with compression, as well as color and duplex ultrasound, were performed to evaluate the deep venous system(s) from the level of the common femoral vein through the popliteal and proximal calf veins.  COMPARISON:  None.  FINDINGS: VENOUS  Normal compressibility of the common femoral, superficial femoral, and popliteal veins, as well as the visualized calf veins. Visualized  portions of profunda femoral vein and great saphenous vein unremarkable. No filling defects to suggest DVT on grayscale or color Doppler imaging. Doppler waveforms show normal direction of venous flow, normal respiratory plasticity and response to augmentation.  Limited views of the contralateral common femoral vein are unremarkable.  OTHER  None.  Limitations: none  IMPRESSION: Negative.  Electronically Signed   By: Constance Holster JeanD.   On: 10/29/2019 16:43  Assessment  The primary encounter diagnosis was Bilateral primary osteoarthritis of knee. Diagnoses of Primary osteoarthritis of left knee and Chronic pain syndrome were also pertinent to this visit.  Plan of Care   Ms. Treva Huyett has a current medication list which includes the following long-term medication(s): amlodipine, atorvastatin, duloxetine, duloxetine, famotidine, hydralazine, hydrochlorothiazide, lisinopril, metformin, omeprazole, tramadol, cetirizine, and pantoprazole.   Orders:  Orders Placed This Encounter  Procedures  . KNEE INJECTION    Local Anesthetic & Steroid injection.    Standing Status:   Future    Standing Expiration Date:   04/17/2020    Scheduling Instructions:     Side: Bilateral     Sedation: None     Timeframe: As soon as schedule allows    Order Specific Question:   Where will this procedure be performed?    Answer:   [facility]   Follow-up plan:   Return in about 2 weeks (around 01/30/2020) for B/L knee steroid.     Left intra-articular knee steroid injection on 05/27/2019, bilateral knee steroid injection 08/21/2019, 11/13/19.  Has previously had 5 series of viscosupplementation at outside clinic.  States it was not effective.  Status post genicular nerve block which was not effective.  Can consider sprint PNS in future     Recent Visits Date Type Provider Dept  12/24/19 Telemedicine Gillis Santa, MD [facility]  11/13/19 Procedure visit Gillis Santa, MD  [facility]  Showing recent visits within past 90 days and meeting all other requirements Today's Visits Date Type Provider Dept  01/16/20 Telemedicine Gillis Santa, MD [facility]  Showing today's visits and meeting all other requirements Future Appointments Date Type Provider Dept  03/19/20 Appointment Gillis Santa, MD [facility]  Showing future appointments within next 90 days and meeting all other requirements  I discussed the assessment and treatment plan with the patient. The patient was provided an opportunity to ask questions and all were answered. The patient agreed with the plan and demonstrated an understanding of the  instructions.  Patient advised to call back or seek an in-person evaluation if the symptoms or condition worsens.  Duration of encounter: 15 minutes.  Note by: Gillis Santa, MD Date: 01/16/2020; Time: 3:09 PM

## 2020-02-05 ENCOUNTER — Encounter: Payer: Self-pay | Admitting: Student in an Organized Health Care Education/Training Program

## 2020-02-05 ENCOUNTER — Other Ambulatory Visit: Payer: Self-pay

## 2020-02-05 ENCOUNTER — Ambulatory Visit
Payer: Medicare Other | Attending: Student in an Organized Health Care Education/Training Program | Admitting: Student in an Organized Health Care Education/Training Program

## 2020-02-05 VITALS — BP 137/76 | HR 60 | Temp 98.2°F | Resp 16 | Ht 62.0 in | Wt 173.0 lb

## 2020-02-05 DIAGNOSIS — M170 Bilateral primary osteoarthritis of knee: Secondary | ICD-10-CM | POA: Insufficient documentation

## 2020-02-05 DIAGNOSIS — G894 Chronic pain syndrome: Secondary | ICD-10-CM | POA: Diagnosis not present

## 2020-02-05 MED ORDER — ROPIVACAINE HCL 2 MG/ML IJ SOLN
INTRAMUSCULAR | Status: AC
  Filled 2020-02-05: qty 10

## 2020-02-05 MED ORDER — ROPIVACAINE HCL 2 MG/ML IJ SOLN
9.0000 mL | Freq: Once | INTRAMUSCULAR | Status: AC
  Administered 2020-02-05: 9 mL

## 2020-02-05 MED ORDER — METHYLPREDNISOLONE ACETATE 40 MG/ML IJ SUSP
40.0000 mg | Freq: Once | INTRAMUSCULAR | Status: AC
  Administered 2020-02-05: 80 mg via INTRA_ARTICULAR

## 2020-02-05 MED ORDER — METHYLPREDNISOLONE ACETATE 40 MG/ML IJ SUSP
INTRAMUSCULAR | Status: AC
  Filled 2020-02-05: qty 2

## 2020-02-05 MED ORDER — LIDOCAINE HCL 2 % IJ SOLN
INTRAMUSCULAR | Status: AC
  Filled 2020-02-05: qty 20

## 2020-02-05 NOTE — Progress Notes (Signed)
Safety precautions to be maintained throughout the outpatient stay will include: orient to surroundings, keep bed in low position, maintain call bell within reach at all times, provide assistance with transfer out of bed and ambulation.  

## 2020-02-05 NOTE — Progress Notes (Signed)
PROVIDER NOTE: Information contained herein reflects review and annotations entered in association with encounter. Interpretation of such information and data should be left to medically-trained personnel. Information provided to patient can be located elsewhere in the medical record under "Patient Instructions". Document created using STT-dictation technology, any transcriptional errors that may result from process are unintentional.    Patient: Jean Stout  Service Category: Procedure  Provider: Edward Jolly, MD  DOB: 1940/07/18  DOS: 02/05/2020  Location: [facility]  MRN: [id number]  Setting: Ambulatory - outpatient  Referring Provider: Carren Rang, PA-C  Type: Established Patient  Specialty: Interventional Pain Management  PCP: Carren Rang, PA-C   Primary Reason for Visit: Interventional Pain Management Treatment. CC: Knee Pain (bilateral)  Procedure:          Anesthesia, Analgesia, Anxiolysis:  Type: Therapeutic Intra-Articular Local anesthetic and steroid Knee Injection #4  Region: Medial infrapatellar Knee Region Level: Knee Joint Laterality: Bilateral  Type: Local Anesthesia Indication(s): Analgesia         Local Anesthetic: Lidocaine 1-2% Route: Infiltration ([place]/IM) IV Access: Declined Sedation: Declined   Position: Sitting   Indications: 1. Bilateral primary osteoarthritis of knee   2. Chronic pain syndrome    Pain Score: Pre-procedure:  (7 left knee, 4 right knee)/10 Post-procedure: 0-No pain/10   Pre-op Assessment:  Jean Stout is a 79 y.o. (year old), female patient, seen today for interventional treatment. She  has a past surgical history that includes Carotid stent; Joint replacement (Bilateral, 2007); and Knee surgery (Left, 2018). Jean Stout has a current medication list which includes the following prescription(s): amlodipine, aspirin ec, atorvastatin, diclofenac sodium, docusate sodium, duloxetine, duloxetine, famotidine, hydralazine,  hydrochlorothiazide, lisinopril, metformin, omeprazole, pantoprazole, cetirizine, and tramadol. Her primarily concern today is the Knee Pain (bilateral)  Initial Vital Signs:  Pulse/HCG Rate: 60  Temp: 98.2 F (36.8 C) Resp: 16 BP: 137/76 SpO2: 98 %  BMI: Estimated body mass index is 31.64 kg/m as calculated from the following:   Height as of this encounter: 5\' 2"  (1.575 m).   Weight as of this encounter: 173 lb (78.5 kg).  Risk Assessment: Allergies: Reviewed. She is allergic to iodinated diagnostic agents, amlodipine besy-benazepril hcl, bextra [valdecoxib], and gadolinium derivatives.  Allergy Precautions: None required Coagulopathies: Reviewed. None identified.  Blood-thinner therapy: None at this time Active Infection(s): Reviewed. None identified. Jean Stout is afebrile  Site Confirmation: Jean Stout was asked to confirm the procedure and laterality before marking the site Procedure checklist: Completed Consent: Before the procedure and under the influence of no sedative(s), amnesic(s), or anxiolytics, the patient was informed of the treatment options, risks and possible complications. To fulfill our ethical and legal obligations, as recommended by the American Medical Association's Code of Ethics, I have informed the patient of my clinical impression; the nature and purpose of the treatment or procedure; the risks, benefits, and possible complications of the intervention; the alternatives, including doing nothing; the risk(s) and benefit(s) of the alternative treatment(s) or procedure(s); and the risk(s) and benefit(s) of doing nothing. The patient was provided information about the general risks and possible complications associated with the procedure. These may include, but are not limited to: failure to achieve desired goals, infection, bleeding, organ or nerve damage, allergic reactions, paralysis, and death. In addition, the patient was informed of those risks and complications  associated to the procedure, such as failure to decrease pain; infection; bleeding; organ or nerve damage with subsequent damage to sensory, motor, and/or autonomic systems, resulting in permanent pain,  numbness, and/or weakness of one or several areas of the body; allergic reactions; (i.e.: anaphylactic reaction); and/or death. Furthermore, the patient was informed of those risks and complications associated with the medications. These include, but are not limited to: allergic reactions (i.e.: anaphylactic or anaphylactoid reaction(s)); adrenal axis suppression; blood sugar elevation that in diabetics may result in ketoacidosis or comma; water retention that in patients with history of congestive heart failure may result in shortness of breath, pulmonary edema, and decompensation with resultant heart failure; weight gain; swelling or edema; medication-induced neural toxicity; particulate matter embolism and blood vessel occlusion with resultant organ, and/or nervous system infarction; and/or aseptic necrosis of one or more joints. Finally, the patient was informed that Medicine is not an exact science; therefore, there is also the possibility of unforeseen or unpredictable risks and/or possible complications that may result in a catastrophic outcome. The patient indicated having understood very clearly. We have given the patient no guarantees and we have made no promises. Enough time was given to the patient to ask questions, all of which were answered to the patient's satisfaction. Jean Stout has indicated that she wanted to continue with the procedure. Attestation: I, the ordering provider, attest that I have discussed with the patient the benefits, risks, side-effects, alternatives, likelihood of achieving goals, and potential problems during recovery for the procedure that I have provided informed consent. Date  Time: 02/05/2020 11:04 AM  Pre-Procedure Preparation:  Monitoring: As per clinic protocol.  Respiration, ETCO2, SpO2, BP, heart rate and rhythm monitor placed and checked for adequate function Safety Precautions: Patient was assessed for positional comfort and pressure points before starting the procedure. Time-out: I initiated and conducted the "Time-out" before starting the procedure, as per protocol. The patient was asked to participate by confirming the accuracy of the "Time Out" information. Verification of the correct person, site, and procedure were performed and confirmed by me, the nursing staff, and the patient. "Time-out" conducted as per Joint Commission's Universal Protocol (UP.01.01.01). Time: 1151  Description of Procedure:          Target Area: Knee Joint Approach: Just above the Medial tibial plateau, lateral to the infrapatellar tendon. Area Prepped: Entire knee area, from the mid-thigh to the mid-shin. Prepping solution: DuraPrep (Iodine Povacrylex [0.7% available iodine] and Isopropyl Alcohol, 74% w/w) Safety Precautions: Aspiration looking for blood return was conducted prior to all injections. At no point did we inject any substances, as a needle was being advanced. No attempts were made at seeking any paresthesias. Safe injection practices and needle disposal techniques used. Medications properly checked for expiration dates. SDV (single dose vial) medications used. Description of the Procedure: Protocol guidelines were followed. The patient was placed in position over the fluoroscopy table. The target area was identified and the area prepped in the usual manner. Skin & deeper tissues infiltrated with local anesthetic. Appropriate amount of time allowed to pass for local anesthetics to take effect. The procedure needles were then advanced to the target area. Proper needle placement secured. Negative aspiration confirmed. Solution injected in intermittent fashion, asking for systemic symptoms every 0.5cc of injectate. The needles were then removed and the area cleansed,  making sure to leave some of the prepping solution back to take advantage of its long term bactericidal properties. Vitals:   02/05/20 1112  BP: 137/76  Pulse: 60  Resp: 16  Temp: 98.2 F (36.8 C)  SpO2: 98%  Weight: 173 lb (78.5 kg)  Height: 5\' 2"  (1.575 m)    Start  Time: 1151 hrs. End Time: 1157 hrs. Materials:  Needle(s) Type: Regular needle Gauge: 25G Length: 1.5-in Medication(s): Please see orders for medications and dosing details. 5 cc solution made of 4 cc of 0.2% ropivacaine, 1 cc of methylprednisolone, 40 mg/cc injected for right knee 5 cc solution made of 4 cc of 0.2% ropivacaine, 1 cc of methylprednisolone, 40 mg/cc injected for left knee Imaging Guidance:          Type of Imaging Technique: None used Indication(s): N/A Exposure Time: No patient exposure Contrast: None used. Fluoroscopic Guidance: N/A Ultrasound Guidance: N/A Interpretation: N/A  Antibiotic Prophylaxis:   Anti-infectives (From admission, onward)   None     Indication(s): None identified  Post-operative Assessment:  Post-procedure Vital Signs:  Pulse/HCG Rate: 60  Temp: 98.2 F (36.8 C) Resp: 16 BP: 137/76 SpO2: 98 %  EBL: None  Complications: No immediate post-treatment complications observed by team, or reported by patient.  Note: The patient tolerated the entire procedure well. A repeat set of vitals were taken after the procedure and the patient was kept under observation following institutional policy, for this type of procedure. Post-procedural neurological assessment was performed, showing return to baseline, prior to discharge. The patient was provided with post-procedure discharge instructions, including a section on how to identify potential problems. Should any problems arise concerning this procedure, the patient was given instructions to immediately contact us, at any time, without hesitation. In any case, we plan to contact the patient by telephone for a follow-up status  report regarding this interventional procedure.  Comments:  No additional relevant information.  Plan of Care   Medications ordered for procedure: Meds ordered this encounter  Medications  . methylPREDNISolone acetate (DEPO-MEDROL) injection 40 mg  . ropivacaine (PF) 2 mg/mL (0.2%) (NAROPIN) injection 9 mL   Medications administered: We administered methylPREDNISolone acetate and ropivacaine (PF) 2 mg/mL (0.2%).  See the medical record for exact dosing, route, and time of administration.  Follow-up plan:   Return in about 4 weeks (around 03/04/2020).      Left intra-articular knee steroid injection on 05/27/2019, bilateral knee steroid injection 08/21/2019, 11/13/19, 02/05/20.  Has previously had 5 series of viscosupplementation at outside clinic.  States it was not effective.  Status post genicular nerve block which was not effective.  Can consider sprint PNS in future   Recent Visits Date Type Provider Dept  01/16/20 Telemedicine Edward Jolly, MD [facility]  12/24/19 Telemedicine Edward Jolly, MD [facility]  11/13/19 Procedure visit Edward Jolly, MD [facility]  Showing recent visits within past 90 days and meeting all other requirements Today's Visits Date Type Provider Dept  02/05/20 Procedure visit Edward Jolly, MD [facility]  Showing today's visits and meeting all other requirements Future Appointments Date Type Provider Dept  03/19/20 Appointment Edward Jolly, MD [facility]  Showing future appointments within next 90 days and meeting all other requirements  Disposition: Discharge home  Discharge (Date  Time): 02/05/2020; 1159 hrs.   Primary Care Physician: Carren Rang, PA-C Location: [facility] Note by: Edward Jolly, MD Date: 02/05/2020; Time: 1:40 PM  Disclaimer:  Medicine is not an exact science. The only guarantee in medicine is that nothing is guaranteed. It is important to  note that the decision to proceed with this intervention was based on the information collected from the patient. The Data and conclusions were drawn from the patient's questionnaire, the interview, and the physical examination. Because the information was provided  in large part by the patient, it cannot be guaranteed that it has not been purposely or unconsciously manipulated. Every effort has been made to obtain as much relevant data as possible for this evaluation. It is important to note that the conclusions that lead to this procedure are derived in large part from the available data. Always take into account that the treatment will also be dependent on availability of resources and existing treatment guidelines, considered by other Pain Management Practitioners as being common knowledge and practice, at the time of the intervention. For Medico-Legal purposes, it is also important to point out that variation in procedural techniques and pharmacological choices are the acceptable norm. The indications, contraindications, technique, and results of the above procedure should only be interpreted and judged by a Board-Certified Interventional Pain Specialist with extensive familiarity and expertise in the same exact procedure and technique.

## 2020-02-06 ENCOUNTER — Telehealth: Payer: Self-pay

## 2020-02-06 NOTE — Telephone Encounter (Signed)
Post procedure follow up call.  Patient states she is doing good.  

## 2020-03-05 ENCOUNTER — Telehealth: Payer: Self-pay

## 2020-03-05 NOTE — Telephone Encounter (Signed)
Left message for patient to call office to discuss phone call.

## 2020-03-05 NOTE — Telephone Encounter (Signed)
Please check her medicines, she states one of her medicines has the wrong doctor on it. IT should both be Dr. Cherylann Ratel

## 2020-03-06 NOTE — Telephone Encounter (Signed)
The medication in question is Cymbalta. She says the bottle has the name of her MD in [place] on it. Dr. Cherylann Ratel has not prescribed since 12-24-19, with no RF. I told her they have probably been filling her script from her MD in [facility]. I informed her that she has an appt on 03-19-20. She states she will have enough meds to last until then.

## 2020-03-19 ENCOUNTER — Other Ambulatory Visit: Payer: Self-pay

## 2020-03-19 ENCOUNTER — Ambulatory Visit
Payer: Medicare Other | Attending: Student in an Organized Health Care Education/Training Program | Admitting: Student in an Organized Health Care Education/Training Program

## 2020-03-19 ENCOUNTER — Encounter: Payer: Self-pay | Admitting: Student in an Organized Health Care Education/Training Program

## 2020-03-19 VITALS — BP 136/67 | HR 70 | Temp 98.3°F | Resp 16 | Ht 62.0 in | Wt 183.0 lb

## 2020-03-19 DIAGNOSIS — M19012 Primary osteoarthritis, left shoulder: Secondary | ICD-10-CM

## 2020-03-19 DIAGNOSIS — F331 Major depressive disorder, recurrent, moderate: Secondary | ICD-10-CM

## 2020-03-19 DIAGNOSIS — M19011 Primary osteoarthritis, right shoulder: Secondary | ICD-10-CM

## 2020-03-19 DIAGNOSIS — G894 Chronic pain syndrome: Secondary | ICD-10-CM | POA: Diagnosis not present

## 2020-03-19 DIAGNOSIS — M170 Bilateral primary osteoarthritis of knee: Secondary | ICD-10-CM | POA: Diagnosis not present

## 2020-03-19 MED ORDER — DULOXETINE HCL 30 MG PO CPEP: ORAL_CAPSULE | ORAL | 0 refills | Status: DC

## 2020-03-19 MED ORDER — DULOXETINE HCL 60 MG PO CPEP: ORAL_CAPSULE | ORAL | 0 refills | Status: DC

## 2020-03-19 MED ORDER — TRAMADOL HCL 50 MG PO TABS: 50.0000 mg | ORAL_TABLET | ORAL | 2 refills | Status: DC | PRN

## 2020-03-19 NOTE — Patient Instructions (Addendum)
Tramadol to last until 07/13/20 and cymbalta have been escribed to your pharmacy.

## 2020-03-19 NOTE — Progress Notes (Signed)
PROVIDER NOTE: Information contained herein reflects review and annotations entered in association with encounter. Interpretation of such information and data should be left to medically-trained personnel. Information provided to patient can be located elsewhere in the medical record under "Patient Instructions". Document created using STT-dictation technology, any transcriptional errors that may result from process are unintentional.    Patient: Jean Stout  Service Category: E/M  Provider: Edward Jolly, MD  DOB: 1940-05-09  DOS: 03/19/2020  Specialty: Interventional Pain Management  MRN: [id number]  Setting: Ambulatory outpatient  PCP: Carren Rang, PA-C  Type: Established Patient    Referring Provider: Carren Rang, PA-C  Location: Office  Delivery: Face-to-face     HPI  [name], a 79 y.o. year old female, is here today because of her Bilateral primary osteoarthritis of knee [M17.0]. [name]'s primary complain today is Knee Pain (Left ) Last encounter: My last encounter with her was on 02/05/2020. Pertinent problems: [name] has Type 2 diabetes mellitus without complication, without long-term current use of insulin (HCC); Osteoarthritis of multiple joints; Primary osteoarthritis of left knee; Localized primary osteoarthritis of shoulder regions, bilateral; Right wrist pain; Chronic pain syndrome; Polyarthralgia; History of bilateral hip replacements; MDD (major depressive disorder), recurrent episode, moderate (HCC); GAD (generalized anxiety disorder); and Panic attacks on their pertinent problem list. Pain Assessment: Severity of Chronic pain is reported as a 5 /10. Location: Knee Left/denies. Onset: More than a month ago. Quality: Discomfort,Constant,Dull. Timing: Constant. Modifying factor(s): tramadol, voltaren and injections. Vitals:  height is 5\' 2"  (1.575 m) and weight is 183 lb (83 kg). Her temporal temperature is 98.3 F (36.8 C). Her blood pressure is 136/67 and  her pulse is 70. Her respiration is 16 and oxygen saturation is 100%.   Reason for encounter: both, medication management and post-procedure assessment.    Post-Procedure Evaluation  Procedure (02/05/2020):   Type: Therapeutic Intra-Articular Local anesthetic and steroid Knee Injection #4  Region: Medial infrapatellar Knee Region Level: Knee Joint Laterality: Bilateral  Sedation: Please see nurses note.  Effectiveness during initial hour after procedure(Ultra-Short Term Relief): 98 %   Local anesthetic used: Long-acting (4-6 hours) Effectiveness: Defined as any analgesic benefit obtained secondary to the administration of local anesthetics. This carries significant diagnostic value as to the etiological location, or anatomical origin, of the pain. Duration of benefit is expected to coincide with the duration of the local anesthetic used.  Effectiveness during initial 4-6 hours after procedure(Short-Term Relief): 98 %   Long-term benefit: Defined as any relief past the pharmacologic duration of the local anesthetics.  Effectiveness past the initial 6 hours after procedure(Long-Term Relief): 98 %   Current benefits: Defined as benefit that persist at this time.   Analgesia:  50% improved Function: [name] reports improvement in function ROM: [name] reports improvement in ROM   Pharmacotherapy Assessment   Analgesic: Tramadol 50 mg every 4 hours as needed, quantity 150/month maximum 5 tablets a day; MME equals 25   Monitoring: [place] PMP: PDMP not reviewed this encounter.       Pharmacotherapy: No side-effects or adverse reactions reported. Compliance: No problems identified. Effectiveness: Clinically acceptable.  Jean Kayser, RN  03/19/2020  8:56 AM  Sign when Signing Visit Nursing Pain Medication Assessment:  Safety precautions to be maintained throughout the outpatient stay will include: orient to surroundings, keep bed in low position, maintain call bell within reach  at all times, provide assistance with transfer out of bed and ambulation.  Medication Inspection Compliance: Pill count  conducted under aseptic conditions, in front of the patient. Neither the pills nor the bottle was removed from the patient's sight at any time. Once count was completed pills were immediately returned to the patient in their original bottle.  Medication: Tramadol (Ultram) Pill/Patch Count: 125 of 150 pills remain Pill/Patch Appearance: Markings consistent with prescribed medication Bottle Appearance: Standard pharmacy container. Clearly labeled. Filled Date: 59 / 18 / 2021 Last Medication intake:  80    UDS:  Summary  Date Value Ref Range Status  10/17/2018 Note  Final    Comment:    ==================================================================== Compliance Drug Analysis, Ur ==================================================================== Test                             Result       Flag       Units Drug Present and Declared for Prescription Verification   Duloxetine                     PRESENT      EXPECTED   Acetaminophen                  PRESENT      EXPECTED   Naproxen                       PRESENT      EXPECTED Drug Present not Declared for Prescription Verification   Orphenadrine                   PRESENT      UNEXPECTED   Citalopram                     PRESENT      UNEXPECTED   Desmethylcitalopram            PRESENT      UNEXPECTED    Desmethylcitalopram is an expected metabolite of citalopram or the    enantiomeric form, escitalopram. Drug Absent but Declared for Prescription Verification   Tramadol                       Not Detected UNEXPECTED ng/mg creat   Cyclobenzaprine                Not Detected UNEXPECTED   Salicylate                     Not Detected UNEXPECTED    Aspirin, as indicated in the declared medication list, is not always    detected even when used as  directed. ==================================================================== Test                      Result    Flag   Units      Ref Range   Creatinine              113              mg/dL      >=86 ==================================================================== Declared Medications:  The flagging and interpretation on this report are based on the  following declared medications.  Unexpected results may arise from  inaccuracies in the declared medications.  **Note: The testing scope of this panel includes these medications:  Cyclobenzaprine (Flexeril)  Duloxetine (Cymbalta)  Naproxen (Aleve)  Tramadol (Ultram)  **Note: The testing scope of this panel does not include small to  moderate amounts of these reported medications:  Acetaminophen  Aspirin  **Note: The testing scope of this panel does not include the  following reported medications:  Amlodipine (Norvasc)  Atorvastatin (Lipitor)  Cetirizine (Zyrtec)  Docusate (Colace)  Hydralazine (Apresoline)  Hydrochlorothiazide (Hydrodiuril)  Iron  Lisinopril (Zestril)  Meloxicam (Mobic)  Metformin (Glucophage)  Pantoprazole (Protonix) ==================================================================== For clinical consultation, please call [phone]. ====================================================================      Assessed for any sx associated with serotonin syndrome as patient is on tramadol and cymbalta. Patient denies muscle rigidity, headaches, diaphoresis, confusion, diarrhea, palpitations. We will continue to monitor patient's kidney function. If it declines, may need to adjust dose of Cymbalta and/or  tramadol.  ROS  Constitutional: Denies any fever or chills Gastrointestinal: No reported hemesis, hematochezia, vomiting, or acute GI distress Musculoskeletal: b/l knee pain Neurological: No reported episodes of acute onset apraxia, aphasia, dysarthria, agnosia, amnesia, paralysis, loss of coordination,  or loss of consciousness  Medication Review  DULoxetine, Diclofenac Sodium, amLODipine, aspirin EC, atorvastatin, cetirizine, docusate sodium, famotidine, hydrALAZINE, hydrochlorothiazide, lisinopril, metFORMIN, omeprazole, pantoprazole, and traMADol  History Review  Allergy: [name] is allergic to iodinated diagnostic agents, amlodipine besy-benazepril hcl, bextra [valdecoxib], and gadolinium derivatives. Drug: [name]  reports no history of drug use. Alcohol:  reports previous alcohol use. Tobacco:  reports that she has never smoked. She has never used smokeless tobacco. Social: [name]  reports that she has never smoked. She has never used smokeless tobacco. She reports previous alcohol use. She reports that she does not use drugs. Medical:  has a past medical history of Arthritis, Coronary artery disease, DM (diabetes mellitus) (HCC), GERD (gastroesophageal reflux disease), HLD (hyperlipidemia), and HTN (hypertension). Surgical: [name]  has a past surgical history that includes Carotid stent; Joint replacement (Bilateral, 2007); and Knee surgery (Left, 2018). Family: family history includes Bipolar disorder in her sister.  Recent Imaging Review  US Venous Img Lower Unilateral Left (DVT) CLINICAL DATA:  Pain and swelling  EXAM: LEFT LOWER EXTREMITY VENOUS DOPPLER ULTRASOUND  TECHNIQUE: Gray-scale sonography with compression, as well as color and duplex ultrasound, were performed to evaluate the deep venous system(s) from the level of the common femoral vein through the popliteal and proximal calf veins.  COMPARISON:  None.  FINDINGS: VENOUS  Normal compressibility of the common femoral, superficial femoral, and popliteal veins, as well as the visualized calf veins. Visualized portions of profunda femoral vein and great saphenous vein unremarkable. No filling defects to suggest DVT on grayscale or color Doppler imaging. Doppler waveforms show normal direction  of venous flow, normal respiratory plasticity and response to augmentation.  Limited views of the contralateral common femoral vein are unremarkable.  OTHER  None.  Limitations: none  IMPRESSION: Negative.  Electronically Signed   By: Katherine Mantlehristopher  Green M.D.   On: 10/29/2019 16:43 Note: Reviewed        Physical Exam  General appearance: Well nourished, well developed, and well hydrated. In no apparent acute distress Mental status: Alert, oriented x 3 (person, place, & time)       Respiratory: No evidence of acute respiratory distress Eyes: PERLA Vitals: BP 136/67 (BP Location: Right Arm, Patient Position: Sitting, Cuff Size: Normal)   Pulse 70   Temp 98.3 F (36.8 C) (Temporal)   Resp 16   Ht 5\' 2"  (1.575 m)   Wt 183 lb (83 kg)   SpO2 100%   BMI 33.47 kg/m  BMI: Estimated body mass index is 33.47 kg/m as calculated from the following:  Height as of this encounter:  (1.575 m).   Weight as of this encounter: 183 lb (83 kg). Ideal: Ideal body weight: 50.1 kg (110 lb 7.2 oz) Adjusted ideal body weight: 63.3 kg (139 lb 7.5 oz)  Gait & Posture Assessment  Ambulation: Unassisted Gait: Relatively normal for age and body habitus Posture: WNL  Lower Extremity Exam    Side: Right lower extremity  Side: Left lower extremity  Stability: No instability observed          Stability: No instability observed          Skin & Extremity Inspection: Skin color, temperature, and hair growth are WNL. No peripheral edema or cyanosis. No masses, redness, swelling, asymmetry, or associated skin lesions. No contractures.  Skin & Extremity Inspection: Skin color, temperature, and hair growth are WNL. No peripheral edema or cyanosis. No masses, redness, swelling, asymmetry, or associated skin lesions. No contractures.  Functional ROM: Pain restricted ROM for all joints of the lower extremity, improved after injection          Functional ROM: Pain restricted ROM for all joints of the lower  extremity  improved after injection          Muscle Tone/Strength: Functionally intact. No obvious neuro-muscular anomalies detected.  Muscle Tone/Strength: Functionally intact. No obvious neuro-muscular anomalies detected.  Sensory (Neurological): Arthropathic arthralgia        improved after injection  Sensory (Neurological): Arthropathic arthralgia        improved after injection  DTR: Patellar: deferred today Achilles: deferred today Plantar: deferred today  DTR: Patellar: deferred today Achilles: deferred today Plantar: deferred today  Palpation: No palpable anomalies  Palpation: No palpable anomalies    Assessment   Status Diagnosis  Controlled Controlled Controlled 1. Bilateral primary osteoarthritis of knee   2. Chronic pain syndrome   3. Localized primary osteoarthritis of shoulder regions, bilateral   4. MDD (major depressive disorder), recurrent episode, moderate (HCC)      Updated Problems: Problem  Mdd (Major Depressive Disorder), Recurrent Episode, Moderate (Hcc)  Gad (Generalized Anxiety Disorder)  Panic Attacks  Primary Osteoarthritis of Left Knee  Localized Primary Osteoarthritis of Shoulder Regions, Bilateral  Right Wrist Pain  Chronic Pain Syndrome  Polyarthralgia  History of Bilateral Hip Replacements  Type 2 Diabetes Mellitus Without Complication, Without Long-Term Current Use of Insulin (Hcc)  Osteoarthritis of Multiple Joints    Plan of Care   [name] has a current medication list which includes the following long-term medication(s): amlodipine, atorvastatin, famotidine, hydralazine, hydrochlorothiazide, lisinopril, metformin, omeprazole, pantoprazole, cetirizine, duloxetine, duloxetine, and [START ON 04/14/2020] tramadol.  Pharmacotherapy (Medications Ordered): Meds ordered this encounter  Medications  . traMADol (ULTRAM) 50 MG tablet    Sig: Take 1 tablet (50 mg total) by mouth every 4 (four) hours as needed. For chronic pain  syndrome.  Maximum 5 tablets/day.  Each prescription to last 30 days.    Dispense:  150 tablet    Refill:  2  . DULoxetine (CYMBALTA) 30 MG capsule    Sig: 30 mg (to be taken with 60 mg for 90 mg total)    Dispense:  90 capsule    Refill:  0  . DULoxetine (CYMBALTA) 60 MG capsule    Sig: 60 mg (to be taken with 30 mg for total 90 mg)    Dispense:  90 capsule    Refill:  0   Orders:  Orders Placed This Encounter  Procedures  . KNEE INJECTION  Local Anesthetic & Steroid injection.    Standing Status:   Future    Standing Expiration Date:   06/17/2020    Scheduling Instructions:     Side: Bilateral     Sedation: None     Timeframe: after Feb 1    Order Specific Question:   Where will this procedure be performed?    Answer:   [facility]  . ToxASSURE Select 13 (MW), Urine    Volume: 30 ml(s). Minimum 3 ml of urine is needed. Document temperature of fresh sample. Indications: Long term (current) use of opiate analgesic (J62.836)    Order Specific Question:   Release to patient    Answer:   Immediate   Follow-up plan:   Return in about 6 weeks (around 04/29/2020) for b/l knee steroid.     Left intra-articular knee steroid injection on 05/27/2019, bilateral knee steroid injection 08/21/2019, 11/13/19, 02/05/20.  Has previously had 5 series of viscosupplementation at outside clinic.  States it was not effective.  Status post genicular nerve block which was not effective.  Can consider sprint PNS in future    Recent Visits Date Type Provider Dept  02/05/20 Procedure visit Edward Jolly, MD [facility]  01/16/20 Telemedicine Edward Jolly, MD [facility]  12/24/19 Telemedicine Edward Jolly, MD [facility]  Showing recent visits within past 90 days and meeting all other requirements Today's Visits Date Type Provider Dept  03/19/20 Office Visit Edward Jolly, MD [facility]  Showing today's visits and meeting all other requirements Future  Appointments No visits were found meeting these conditions. Showing future appointments within next 90 days and meeting all other requirements  I discussed the assessment and treatment plan with the patient. The patient was provided an opportunity to ask questions and all were answered. The patient agreed with the plan and demonstrated an understanding of the instructions.  Patient advised to call back or seek an in-person evaluation if the symptoms or condition worsens.  Duration of encounter:30 minutes.  Note by: Edward Jolly, MD Date: 03/19/2020; Time: 9:26 AM

## 2020-03-19 NOTE — Progress Notes (Signed)
Nursing Pain Medication Assessment:  Safety precautions to be maintained throughout the outpatient stay will include: orient to surroundings, keep bed in low position, maintain call bell within reach at all times, provide assistance with transfer out of bed and ambulation.  Medication Inspection Compliance: Pill count conducted under aseptic conditions, in front of the patient. Neither the pills nor the bottle was removed from the patient's sight at any time. Once count was completed pills were immediately returned to the patient in their original bottle.  Medication: Tramadol (Ultram) Pill/Patch Count: 125 of 150 pills remain Pill/Patch Appearance: Markings consistent with prescribed medication Bottle Appearance: Standard pharmacy container. Clearly labeled. Filled Date: 67 / 18 / 2021 Last Medication intake:  Today

## 2020-04-03 LAB — TOXASSURE SELECT 13 (MW), URINE

## 2020-05-06 ENCOUNTER — Other Ambulatory Visit: Payer: Self-pay

## 2020-05-06 ENCOUNTER — Encounter: Payer: Self-pay | Admitting: Student in an Organized Health Care Education/Training Program

## 2020-05-06 ENCOUNTER — Ambulatory Visit
Payer: Medicare Other | Attending: Student in an Organized Health Care Education/Training Program | Admitting: Student in an Organized Health Care Education/Training Program

## 2020-05-06 VITALS — BP 134/59 | HR 81 | Temp 97.2°F | Resp 18 | Ht 62.0 in | Wt 180.0 lb

## 2020-05-06 DIAGNOSIS — M170 Bilateral primary osteoarthritis of knee: Secondary | ICD-10-CM | POA: Diagnosis not present

## 2020-05-06 DIAGNOSIS — G894 Chronic pain syndrome: Secondary | ICD-10-CM | POA: Diagnosis not present

## 2020-05-06 MED ORDER — METHYLPREDNISOLONE ACETATE 80 MG/ML IJ SUSP
INTRAMUSCULAR | Status: AC
  Filled 2020-05-06: qty 1

## 2020-05-06 MED ORDER — ROPIVACAINE HCL 2 MG/ML IJ SOLN
9.0000 mL | Freq: Once | INTRAMUSCULAR | Status: AC
  Administered 2020-05-06: 7 mL

## 2020-05-06 MED ORDER — METHYLPREDNISOLONE ACETATE 80 MG/ML IJ SUSP
80.0000 mg | Freq: Once | INTRAMUSCULAR | Status: AC
  Administered 2020-05-06: 80 mg via INTRA_ARTICULAR

## 2020-05-06 MED ORDER — ROPIVACAINE HCL 2 MG/ML IJ SOLN
INTRAMUSCULAR | Status: AC
  Filled 2020-05-06: qty 10

## 2020-05-06 MED ORDER — LIDOCAINE HCL 2 % IJ SOLN
20.0000 mL | Freq: Once | INTRAMUSCULAR | Status: AC
  Administered 2020-05-06: 200 mg
  Filled 2020-05-06: qty 20

## 2020-05-06 MED ORDER — LIDOCAINE HCL (PF) 1 % IJ SOLN
INTRAMUSCULAR | Status: AC
  Filled 2020-05-06: qty 5

## 2020-05-06 NOTE — Progress Notes (Signed)
Safety precautions to be maintained throughout the outpatient stay will include: orient to surroundings, keep bed in low position, maintain call bell within reach at all times, provide assistance with transfer out of bed and ambulation.  

## 2020-05-06 NOTE — Progress Notes (Signed)
PROVIDER NOTE: Information contained herein reflects review and annotations entered in association with encounter. Interpretation of such information and data should be left to medically-trained personnel. Information provided to patient can be located elsewhere in the medical record under "Patient Instructions". Document created using STT-dictation technology, any transcriptional errors that may result from process are unintentional.    Patient: Jean Stout  Service Category: Procedure  Provider: Edward Jolly, MD  DOB: 02-Apr-1940  DOS: 05/06/2020  Location: [facility]  MRN: [id number]  Setting: Ambulatory - outpatient  Referring Provider: Carren Rang, PA-C  Type: Established Patient  Specialty: Interventional Pain Management  PCP: Carren Rang, PA-C   Primary Reason for Visit: Interventional Pain Management Treatment. CC: Knee Pain (bilateral)  Procedure:          Anesthesia, Analgesia, Anxiolysis:  Type: Therapeutic Intra-Articular Local anesthetic and steroid Knee Injection #5  Region: Medial infrapatellar Knee Region Level: Knee Joint Laterality: Bilateral  Type: Local Anesthesia Indication(s): Analgesia         Local Anesthetic: Lidocaine 1-2% Route: Infiltration (Central/IM) IV Access: Declined Sedation: Declined   Position: Sitting   Indications: 1. Bilateral primary osteoarthritis of knee   2. Chronic pain syndrome    Pain Score: Pre-procedure: 5 /10 Post-procedure: 5 /10   Pre-op Assessment:  Jean Stout is a 80 y.o. (year old), female patient, seen today for interventional treatment. She  has a past surgical history that includes Carotid stent; Joint replacement (Bilateral, 2007); and Knee surgery (Left, 2018). Jean Stout has a current medication list which includes the following prescription(s): amlodipine, aspirin ec, atorvastatin, diclofenac sodium, docusate sodium, duloxetine, duloxetine, famotidine, glucosamine-chondroitin, hydralazine,  hydrochlorothiazide, lisinopril, metformin, omeprazole, pantoprazole, tramadol, and cetirizine. Her primarily concern today is the Knee Pain (bilateral)  Initial Vital Signs:  Pulse/HCG Rate: 81  Temp: (!) 97.2 F (36.2 C) Resp: 18 BP: (!) 134/59 SpO2: 100 %  BMI: Estimated body mass index is 32.92 kg/m as calculated from the following:   Height as of this encounter: 5\' 2"  (1.575 m).   Weight as of this encounter: 180 lb (81.6 kg).  Risk Assessment: Allergies: Reviewed. She is allergic to iodinated diagnostic agents, amlodipine besy-benazepril hcl, bextra [valdecoxib], and gadolinium derivatives.  Allergy Precautions: None required Coagulopathies: Reviewed. None identified.  Blood-thinner therapy: None at this time Active Infection(s): Reviewed. None identified. Jean Stout is afebrile  Site Confirmation: Jean Stout was asked to confirm the procedure and laterality before marking the site Procedure checklist: Completed Consent: Before the procedure and under the influence of no sedative(s), amnesic(s), or anxiolytics, the patient was informed of the treatment options, risks and possible complications. To fulfill our ethical and legal obligations, as recommended by the American Medical Association's Code of Ethics, I have informed the patient of my clinical impression; the nature and purpose of the treatment or procedure; the risks, benefits, and possible complications of the intervention; the alternatives, including doing nothing; the risk(s) and benefit(s) of the alternative treatment(s) or procedure(s); and the risk(s) and benefit(s) of doing nothing. The patient was provided information about the general risks and possible complications associated with the procedure. These may include, but are not limited to: failure to achieve desired goals, infection, bleeding, organ or nerve damage, allergic reactions, paralysis, and death. In addition, the patient was informed of those risks and  complications associated to the procedure, such as failure to decrease pain; infection; bleeding; organ or nerve damage with subsequent damage to sensory, motor, and/or autonomic systems, resulting in permanent pain, numbness, and/or  weakness of one or several areas of the body; allergic reactions; (i.e.: anaphylactic reaction); and/or death. Furthermore, the patient was informed of those risks and complications associated with the medications. These include, but are not limited to: allergic reactions (i.e.: anaphylactic or anaphylactoid reaction(s)); adrenal axis suppression; blood sugar elevation that in diabetics may result in ketoacidosis or comma; water retention that in patients with history of congestive heart failure may result in shortness of breath, pulmonary edema, and decompensation with resultant heart failure; weight gain; swelling or edema; medication-induced neural toxicity; particulate matter embolism and blood vessel occlusion with resultant organ, and/or nervous system infarction; and/or aseptic necrosis of one or more joints. Finally, the patient was informed that Medicine is not an exact science; therefore, there is also the possibility of unforeseen or unpredictable risks and/or possible complications that may result in a catastrophic outcome. The patient indicated having understood very clearly. We have given the patient no guarantees and we have made no promises. Enough time was given to the patient to ask questions, all of which were answered to the patient's satisfaction. Jean Stout has indicated that she wanted to continue with the procedure. Attestation: I, the ordering provider, attest that I have discussed with the patient the benefits, risks, side-effects, alternatives, likelihood of achieving goals, and potential problems during recovery for the procedure that I have provided informed consent. Date  Time: 05/06/2020  8:30 AM  Pre-Procedure Preparation:  Monitoring: As per clinic  protocol. Respiration, ETCO2, SpO2, BP, heart rate and rhythm monitor placed and checked for adequate function Safety Precautions: Patient was assessed for positional comfort and pressure points before starting the procedure. Time-out: I initiated and conducted the "Time-out" before starting the procedure, as per protocol. The patient was asked to participate by confirming the accuracy of the "Time Out" information. Verification of the correct person, site, and procedure were performed and confirmed by me, the nursing staff, and the patient. "Time-out" conducted as per Joint Commission's Universal Protocol (UP.01.01.01). Time: 0903  Description of Procedure:          Target Area: Knee Joint Approach: Just above the Medial tibial plateau, lateral to the infrapatellar tendon. Area Prepped: Entire knee area, from the mid-thigh to the mid-shin. Prepping solution: DuraPrep (Iodine Povacrylex [0.7% available iodine] and Isopropyl Alcohol, 74% w/w) Safety Precautions: Aspiration looking for blood return was conducted prior to all injections. At no point did we inject any substances, as a needle was being advanced. No attempts were made at seeking any paresthesias. Safe injection practices and needle disposal techniques used. Medications properly checked for expiration dates. SDV (single dose vial) medications used. Description of the Procedure: Protocol guidelines were followed. The patient was placed in position over the fluoroscopy table. The target area was identified and the area prepped in the usual manner. Skin & deeper tissues infiltrated with local anesthetic. Appropriate amount of time allowed to pass for local anesthetics to take effect. The procedure needles were then advanced to the target area. Proper needle placement secured. Negative aspiration confirmed. Solution injected in intermittent fashion, asking for systemic symptoms every 0.5cc of injectate. The needles were then removed and the area  cleansed, making sure to leave some of the prepping solution back to take advantage of its long term bactericidal properties. Vitals:   05/06/20 0834  BP: (!) 134/59  Pulse: 81  Resp: 18  Temp: (!) 97.2 F (36.2 C)  TempSrc: Temporal  SpO2: 100%  Weight: 180 lb (81.6 kg)  Height: 5\' 2"  (1.575 m)  Start Time: 0903 hrs. End Time: 0905 hrs. Materials:  Needle(s) Type: Regular needle Gauge: 25G Length: 1.5-in Medication(s): Please see orders for medications and dosing details. 5 cc solution made of 4.5 cc of 0.2% ropivacaine, 0.5 cc of methylprednisolone, 80 mg/cc injected for right knee 5 cc solution made of 4.5 cc of 0.2% ropivacaine, 0.5 cc of methylprednisolone, 80 mg/cc injected for left   knee  Imaging Guidance:          Type of Imaging Technique: None used Indication(s): N/A Exposure Time: No patient exposure Contrast: None used. Fluoroscopic Guidance: N/A Ultrasound Guidance: N/A Interpretation: N/A  Antibiotic Prophylaxis:   Anti-infectives (From admission, onward)   None     Indication(s): None identified  Post-operative Assessment:  Post-procedure Vital Signs:  Pulse/HCG Rate: 81  Temp: (!) 97.2 F (36.2 C) Resp: 18 BP: (!) 134/59 SpO2: 100 %  EBL: None  Complications: No immediate post-treatment complications observed by team, or reported by patient.  Note: The patient tolerated the entire procedure well. A repeat set of vitals were taken after the procedure and the patient was kept under observation following institutional policy, for this type of procedure. Post-procedural neurological assessment was performed, showing return to baseline, prior to discharge. The patient was provided with post-procedure discharge instructions, including a section on how to identify potential problems. Should any problems arise concerning this procedure, the patient was given instructions to immediately contact us, at any time, without hesitation. In any case, we plan to  contact the patient by telephone for a follow-up status report regarding this interventional procedure.  Comments:  No additional relevant information.  Plan of Care   Medications ordered for procedure: Meds ordered this encounter  Medications  . lidocaine (XYLOCAINE) 2 % (with pres) injection 400 mg  . methylPREDNISolone acetate (DEPO-MEDROL) injection 80 mg  . ropivacaine (PF) 2 mg/mL (0.2%) (NAROPIN) injection 9 mL   Medications administered: We administered lidocaine, methylPREDNISolone acetate, and ropivacaine (PF) 2 mg/mL (0.2%).  See the medical record for exact dosing, route, and time of administration.  Follow-up plan:   Return if symptoms worsen or fail to improve.      Left intra-articular knee steroid injection on 05/27/2019, bilateral knee steroid injection 08/21/2019, 11/13/19, 02/05/20.  Has previously had 5 series of viscosupplementation at outside clinic.  States it was not effective.  Status post genicular nerve block which was not effective.  Can consider sprint PNS in future   Recent Visits Date Type Provider Dept  03/19/20 Office Visit Edward Jolly, MD [facility]  Showing recent visits within past 90 days and meeting all other requirements Today's Visits Date Type Provider Dept  05/06/20 Procedure visit Edward Jolly, MD [facility]  Showing today's visits and meeting all other requirements Future Appointments No visits were found meeting these conditions. Showing future appointments within next 90 days and meeting all other requirements  Disposition: Discharge home  Discharge (Date  Time): 05/06/2020; 0915 hrs.   Primary Care Physician: Carren Rang, PA-C Location: [facility] Note by: Edward Jolly, MD Date: 05/06/2020; Time: 9:27 AM  Disclaimer:  Medicine is not an exact science. The only guarantee in medicine is that nothing is guaranteed. It is important to note that the decision to proceed with this  intervention was based on the information collected from the patient. The Data and conclusions were drawn from the patient's questionnaire, the interview, and the physical examination. Because the information was provided in large part by the patient,  it cannot be guaranteed that it has not been purposely or unconsciously manipulated. Every effort has been made to obtain as much relevant data as possible for this evaluation. It is important to note that the conclusions that lead to this procedure are derived in large part from the available data. Always take into account that the treatment will also be dependent on availability of resources and existing treatment guidelines, considered by other Pain Management Practitioners as being common knowledge and practice, at the time of the intervention. For Medico-Legal purposes, it is also important to point out that variation in procedural techniques and pharmacological choices are the acceptable norm. The indications, contraindications, technique, and results of the above procedure should only be interpreted and judged by a Board-Certified Interventional Pain Specialist with extensive familiarity and expertise in the same exact procedure and technique.

## 2020-05-06 NOTE — Patient Instructions (Signed)

## 2020-05-07 ENCOUNTER — Telehealth: Payer: Self-pay | Admitting: *Deleted

## 2020-05-07 NOTE — Telephone Encounter (Signed)
Called patient re; procedure on yesterday.  No questions or concerns and reports that it is helping.

## 2020-05-12 ENCOUNTER — Telehealth: Payer: Self-pay

## 2020-05-12 NOTE — Telephone Encounter (Signed)
Schedule her an  appointment with Dr Cherylann Ratel to discuss medication changes.

## 2020-05-12 NOTE — Telephone Encounter (Signed)
Pt.'s DULoxetine RX has been made a tier 3 meaning she has to pay $100 and she said she wanted to know if the cost could be lowered so she could afford it. If not could it be d/c

## 2020-05-19 ENCOUNTER — Ambulatory Visit
Payer: Medicare Other | Attending: Student in an Organized Health Care Education/Training Program | Admitting: Student in an Organized Health Care Education/Training Program

## 2020-05-19 ENCOUNTER — Other Ambulatory Visit: Payer: Self-pay

## 2020-05-19 ENCOUNTER — Encounter: Payer: Self-pay | Admitting: Student in an Organized Health Care Education/Training Program

## 2020-05-19 VITALS — BP 132/80 | HR 69 | Temp 96.8°F | Resp 16 | Ht 62.0 in | Wt 180.0 lb

## 2020-05-19 DIAGNOSIS — M25571 Pain in right ankle and joints of right foot: Secondary | ICD-10-CM | POA: Diagnosis present

## 2020-05-19 DIAGNOSIS — G8929 Other chronic pain: Secondary | ICD-10-CM

## 2020-05-19 DIAGNOSIS — M170 Bilateral primary osteoarthritis of knee: Secondary | ICD-10-CM | POA: Diagnosis present

## 2020-05-19 DIAGNOSIS — G894 Chronic pain syndrome: Secondary | ICD-10-CM

## 2020-05-19 DIAGNOSIS — M1712 Unilateral primary osteoarthritis, left knee: Secondary | ICD-10-CM

## 2020-05-19 DIAGNOSIS — F331 Major depressive disorder, recurrent, moderate: Secondary | ICD-10-CM

## 2020-05-19 MED ORDER — DULOXETINE HCL 60 MG PO CPEP: ORAL_CAPSULE | ORAL | 0 refills | Status: DC

## 2020-05-19 MED ORDER — DULOXETINE HCL 30 MG PO CPEP
ORAL_CAPSULE | ORAL | 0 refills | Status: DC
Start: 2020-05-19 — End: 2020-09-08

## 2020-05-19 MED ORDER — DULOXETINE HCL 30 MG PO CPEP: ORAL_CAPSULE | ORAL | 0 refills | Status: DC

## 2020-05-19 NOTE — Progress Notes (Signed)
PROVIDER NOTE: Information contained herein reflects review and annotations entered in association with encounter. Interpretation of such information and data should be left to medically-trained personnel. Information provided to patient can be located elsewhere in the medical record under "Patient Instructions". Document created using STT-dictation technology, any transcriptional errors that may result from process are unintentional.    Patient: Jean Stout  Service Category: E/M  Provider: Gillis Santa, MD  DOB: [birth year]  DOS: 05/19/2020  Specialty: Interventional Pain Management  MRN: [id number]  Setting: Ambulatory outpatient  PCP: Wayland Denis, PA-C  Type: Established Patient    Referring Provider: Wayland Denis, PA-C  Location: Office  Delivery: Face-to-face     HPI  Jean Stout, a 80 y.o. year old female, is here today because of her Chronic pain syndrome [G89.4]. Jean Stout primary complain today is Knee Pain (bilateral) Last encounter: My last encounter with her was on 05/06/2020. Pertinent problems: Jean Stout has Type 2 diabetes mellitus without complication, without long-term current use of insulin ([place]); Osteoarthritis of multiple joints; Primary osteoarthritis of left knee; Localized primary osteoarthritis of shoulder regions, bilateral; Right wrist pain; Chronic pain syndrome; Polyarthralgia; History of bilateral hip replacements; MDD (major depressive disorder), recurrent episode, moderate ([place]); GAD (generalized anxiety disorder); and Panic attacks on their pertinent problem list. Pain Assessment: Severity of Chronic pain is reported as a 7 /10. Location: Knee Right,Left/denies. Onset: More than a month ago. Quality: Sharp,Dull. Timing: Intermittent. Modifying factor(s): medications, rest, topical medications. Vitals:  height is '5\' 2"'  (1.575 m) and weight is 180 lb (81.6 kg). Her temporal temperature is 96.8 F (36 C) (abnormal). Her blood pressure is 132/80 and her  pulse is 69. Her respiration is 16 and oxygen saturation is 99%.   Reason for encounter: medication management.    Patient states that she received a letter from her insurance company stating that her Cymbalta would not be covered that it was a tier 3 drug.  She comes in requesting a substitute for Celexa.  I informed her that while Cymbalta as an antidepressant I am utilizing it for chronic pain syndrome as well as peripheral neuropathy.  As result, Celexa is not an appropriate substitute.  I have put this on her recent prescription and have instructed her to notify her insurance company regarding the indications Cymbalta for her which are for pain management.  Patient is also requesting a repeat knee steroid injection which I will place an order for.  Pharmacotherapy Assessment   Analgesic: Tramadol 50 mg every 4 hours as needed, quantity 150/month maximum 5 tablets a day; MME equals 25   Monitoring: [place] PMP: PDMP not reviewed this encounter.       Pharmacotherapy: No side-effects or adverse reactions reported. Compliance: No problems identified. Effectiveness: Clinically acceptable.  Landis Martins, RN  05/19/2020  8:27 AM  Sign when Signing Visit Safety precautions to be maintained throughout the outpatient stay will include: orient to surroundings, keep bed in low position, maintain call bell within reach at all times, provide assistance with transfer out of bed and ambulation.     UDS:  Summary  Date Value Ref Range Status  03/19/2020 Note  Final    Comment:    ==================================================================== [employer] (MW) ==================================================================== Test                             Result       Flag       Units  Drug Present and Declared for Prescription Verification   Tramadol                       >3497        EXPECTED   ng/mg creat   O-Desmethyltramadol            >3497        EXPECTED   ng/mg creat    N-Desmethyltramadol            >3497        EXPECTED   ng/mg creat    Source of tramadol is a prescription medication. O-desmethyltramadol    and N-desmethyltramadol are expected metabolites of tramadol.  ==================================================================== Test                      Result    Flag   Units      Ref Range   Creatinine              143              mg/dL      >=20 ==================================================================== Declared Medications:  The flagging and interpretation on this report are based on the  following declared medications.  Unexpected results may arise from  inaccuracies in the declared medications.   **Note: The testing scope of this panel includes these medications:   Tramadol (Ultram)   **Note: The testing scope of this panel does not include the  following reported medications:   Amlodipine (Norvasc)  Aspirin  Atorvastatin (Lipitor)  Cetirizine (Zyrtec)  Diclofenac  Docusate (Colace)  Duloxetine (Cymbalta)  Famotidine (Pepcid)  Hydralazine (Apresoline)  Hydrochlorothiazide (Hydrodiuril)  Lisinopril (Zestril)  Metformin (Glucophage)  Omeprazole (Prilosec)  Pantoprazole (Protonix) ==================================================================== For clinical consultation, please call [phone]. ====================================================================      ROS  Constitutional: Denies any fever or chills Gastrointestinal: No reported hemesis, hematochezia, vomiting, or acute GI distress Musculoskeletal: Bilateral knee, bilateral low back pain Neurological: No reported episodes of acute onset apraxia, aphasia, dysarthria, agnosia, amnesia, paralysis, loss of coordination, or loss of consciousness  Medication Review  DULoxetine, Diclofenac Sodium, amLODipine, aspirin EC, atorvastatin, cetirizine, docusate sodium, famotidine, glucosamine-chondroitin, hydrALAZINE, hydrochlorothiazide, lisinopril,  metFORMIN, omeprazole, pantoprazole, and traMADol  History Review  Allergy: Jean Stout is allergic to iodinated diagnostic agents, amlodipine besy-benazepril hcl, bextra [valdecoxib], and gadolinium derivatives. Drug: Jean Stout  reports no history of drug use. Alcohol:  reports previous alcohol use. Tobacco:  reports that she has never smoked. She has never used smokeless tobacco. Social: Jean Stout  reports that she has never smoked. She has never used smokeless tobacco. She reports previous alcohol use. She reports that she does not use drugs. Medical:  has a past medical history of Arthritis, Coronary artery disease, DM (diabetes mellitus) ([place]), GERD (gastroesophageal reflux disease), HLD (hyperlipidemia), and HTN (hypertension). Surgical: Jean Stout  has a past surgical history that includes Carotid stent; Joint replacement (Bilateral, 2007); and Knee surgery (Left, 2018). Family: family history includes Bipolar disorder in her sister.  Laboratory Chemistry Profile   Renal No results found for: BUN, CREATININE, LABCREA, BCR, GFR, GFRAA, GFRNONAA, LABVMA, EPIRU, EPINEPH24HUR, NOREPRU, NOREPI24HUR, DOPARU, [id number]   Hepatic No results found for: AST, ALT, ALBUMIN, ALKPHOS, HCVAB, AMYLASE, LIPASE, AMMONIA   Electrolytes No results found for: NA, K, CL, CALCIUM, MG, PHOS   Bone No results found for: VD25OH, [id number], [id number], [id number], 25OHVITD1, 25OHVITD2, 25OHVITD3, TESTOFREE, TESTOSTERONE   Inflammation (CRP: Acute Phase) (ESR: Chronic Phase)  No results found for: CRP, ESRSEDRATE, LATICACIDVEN     Note: Above Lab results reviewed.  Recent Imaging Review  US Venous Img Lower Unilateral Left (DVT) CLINICAL DATA:  Pain and swelling  EXAM: LEFT LOWER EXTREMITY VENOUS DOPPLER ULTRASOUND  TECHNIQUE: Gray-scale sonography with compression, as well as color and duplex ultrasound, were performed to evaluate the deep venous system(s) from the level of the common femoral vein  through the popliteal and proximal calf veins.  COMPARISON:  None.  FINDINGS: VENOUS  Normal compressibility of the common femoral, superficial femoral, and popliteal veins, as well as the visualized calf veins. Visualized portions of profunda femoral vein and great saphenous vein unremarkable. No filling defects to suggest DVT on grayscale or color Doppler imaging. Doppler waveforms show normal direction of venous flow, normal respiratory plasticity and response to augmentation.  Limited views of the contralateral common femoral vein are unremarkable.  OTHER  None.  Limitations: none  IMPRESSION: Negative.  Electronically Signed   By: Constance Holster M.D.   On: 10/29/2019 16:43 Note: Reviewed        Physical Exam  General appearance: Well nourished, well developed, and well hydrated. In no apparent acute distress Mental status: Alert, oriented x 3 (person, place, & time)       Respiratory: No evidence of acute respiratory distress Eyes: PERLA Vitals: BP 132/80   Pulse 69   Temp (!) 96.8 F (36 C) (Temporal)   Resp 16   Ht '5\' 2"'  (1.575 m)   Wt 180 lb (81.6 kg)   SpO2 99%   BMI 32.92 kg/m  BMI: Estimated body mass index is 32.92 kg/m as calculated from the following:   Height as of this encounter: '5\' 2"'  (1.575 m).   Weight as of this encounter: 180 lb (81.6 kg). Ideal: Ideal body weight: 50.1 kg (110 lb 7.2 oz) Adjusted ideal body weight: 62.7 kg (138 lb 4.3 oz)  Positive low back pain, pain with facet loading Bilateral knee pain, limited range of motion of knee due to osteoarthritis  Assessment   Status Diagnosis  Controlled Controlled Controlled 1. Chronic pain syndrome   2. Bilateral primary osteoarthritis of knee   3. Chronic pain of right ankle   4. MDD (major depressive disorder), recurrent episode, moderate ([place])   5. Primary osteoarthritis of left knee      Updated Problems: Problem  Bilateral Primary Osteoarthritis of Knee    Plan  of Care  Jean Stout has a current medication list which includes the following long-term medication(s): amlodipine, atorvastatin, famotidine, hydralazine, hydrochlorothiazide, lisinopril, metformin, omeprazole, pantoprazole, tramadol, cetirizine, duloxetine, and duloxetine.  Pharmacotherapy (Medications Ordered): Meds ordered this encounter  Medications  . DISCONTD: DULoxetine (CYMBALTA) 30 MG capsule    Sig: 30 mg (to be taken with 60 mg for 90 mg total)    Dispense:  90 capsule    Refill:  0    For peripheral neuropathy  . DISCONTD: DULoxetine (CYMBALTA) 60 MG capsule    Sig: 60 mg (to be taken with 30 mg for total 90 mg)    Dispense:  90 capsule    Refill:  0    For peripheral neuropathy  . DULoxetine (CYMBALTA) 30 MG capsule    Sig: 30 mg (to be taken with 60 mg for 90 mg total)    Dispense:  90 capsule    Refill:  0    For peripheral neuropathy  . DULoxetine (CYMBALTA) 60 MG capsule    Sig: 60 mg (  to be taken with 30 mg for total 90 mg)    Dispense:  90 capsule    Refill:  0    For peripheral neuropathy   Follow-up plan:   Return in about 3 months (around 08/16/2020) for Medication Management, in person.     Left intra-articular knee steroid injection on 05/27/2019, bilateral knee steroid injection 08/21/2019, 11/13/19, 02/05/20.  Has previously had 5 series of viscosupplementation at outside clinic.  States it was not effective.  Status post genicular nerve block which was not effective.  Can consider sprint PNS in future    Recent Visits Date Type Provider Dept  05/06/20 Procedure visit Gillis Santa, MD [facility]  03/19/20 Office Visit Gillis Santa, MD [facility]  Showing recent visits within past 90 days and meeting all other requirements Today's Visits Date Type Provider Dept  05/19/20 Office Visit Gillis Santa, MD [facility]  Showing today's visits and meeting all other requirements Future Appointments Date Type Provider Dept   08/11/20 Appointment Gillis Santa, MD [facility]  Showing future appointments within next 90 days and meeting all other requirements  I discussed the assessment and treatment plan with the patient. The patient was provided an opportunity to ask questions and all were answered. The patient agreed with the plan and demonstrated an understanding of the instructions.  Patient advised to call back or seek an in-person evaluation if the symptoms or condition worsens.  Duration of encounter: 30 minutes.  Note by: Gillis Santa, MD Date: 05/19/2020; Time: 10:24 AM

## 2020-05-19 NOTE — Progress Notes (Signed)
Safety precautions to be maintained throughout the outpatient stay will include: orient to surroundings, keep bed in low position, maintain call bell within reach at all times, provide assistance with transfer out of bed and ambulation.  °

## 2020-05-26 ENCOUNTER — Telehealth: Payer: Self-pay

## 2020-05-26 NOTE — Telephone Encounter (Signed)
Will do PA

## 2020-05-26 NOTE — Telephone Encounter (Signed)
Pt wants her medication lowered from a tier 3 to a tier 2. She says it cost over $200 on a tier 3 ans she can not afford it.  Med: Duloxetine 30 & 60 mg

## 2020-05-28 ENCOUNTER — Telehealth: Payer: Self-pay

## 2020-05-28 ENCOUNTER — Ambulatory Visit
Payer: Medicare Other | Attending: Student in an Organized Health Care Education/Training Program | Admitting: Student in an Organized Health Care Education/Training Program

## 2020-05-28 NOTE — Telephone Encounter (Signed)
Pt states that her Cymbalta 30MG  and 60MG  have been denied and she needs meds ASAP

## 2020-05-29 ENCOUNTER — Telehealth: Payer: Self-pay

## 2020-05-29 NOTE — Telephone Encounter (Signed)
Jean Stout left message on machine stating that she had gotten some medications from her PCP and would like to know if Dr Cherylann Ratel could order something besides Cymbalta.  Please schedule this aptient a virtual appointment with Dr Cherylann Ratel asap to discuss.

## 2020-06-02 NOTE — Telephone Encounter (Signed)
Left patient vmail asking her to call for F2F Med Mgmt appt to discuss new medications suggested by her pcp

## 2020-06-04 ENCOUNTER — Telehealth: Payer: Self-pay | Admitting: Student in an Organized Health Care Education/Training Program

## 2020-06-04 NOTE — Telephone Encounter (Signed)
Is this something you can do for her?

## 2020-06-04 NOTE — Telephone Encounter (Signed)
The tier is designated by [employer]. I have specified in my previous clinic note that her Cymbalta is for neuropathic pain and not depression.  If that would be helpful, we can send my last clinic note to the insurance company which specifies why I am prescribing her duloxetine.

## 2020-06-04 NOTE — Telephone Encounter (Signed)
See new note from patient

## 2020-06-04 NOTE — Telephone Encounter (Signed)
Patient states she only needs Dr. Cherylann Ratel to send a request to lower the tier for Duloxetine to her insurance company because she takes it for pain not depression. Says he has done this before and it allowed her to get this medication a lot cheaper. She would like to avoid having to come in for another appt as she was here just a cpl weeks ago. Please ask Dr. Cherylann Ratel if he will do this one more time and use the generic (Duloxetine) in the note to insurance company.  Please advise patient if this can be done.

## 2020-06-05 NOTE — Telephone Encounter (Signed)
Called and talked with Jean Stout to inform her that I was sending a copy of Dr Cherylann Ratel last note/visit to explained to them why she is taking Duloxetine.  Patient with understanding. History faxed.

## 2020-08-05 ENCOUNTER — Ambulatory Visit
Payer: Medicare Other | Attending: Student in an Organized Health Care Education/Training Program | Admitting: Student in an Organized Health Care Education/Training Program

## 2020-08-05 ENCOUNTER — Other Ambulatory Visit: Payer: Self-pay

## 2020-08-05 ENCOUNTER — Encounter: Payer: Self-pay | Admitting: Student in an Organized Health Care Education/Training Program

## 2020-08-05 VITALS — BP 139/74 | HR 59 | Temp 97.0°F | Resp 16 | Ht 62.0 in | Wt 180.0 lb

## 2020-08-05 DIAGNOSIS — M170 Bilateral primary osteoarthritis of knee: Secondary | ICD-10-CM | POA: Diagnosis not present

## 2020-08-05 DIAGNOSIS — G894 Chronic pain syndrome: Secondary | ICD-10-CM | POA: Diagnosis not present

## 2020-08-05 DIAGNOSIS — M19011 Primary osteoarthritis, right shoulder: Secondary | ICD-10-CM | POA: Insufficient documentation

## 2020-08-05 DIAGNOSIS — M19012 Primary osteoarthritis, left shoulder: Secondary | ICD-10-CM | POA: Diagnosis present

## 2020-08-05 MED ORDER — ROPIVACAINE HCL 2 MG/ML IJ SOLN
INTRAMUSCULAR | Status: AC
  Filled 2020-08-05: qty 10

## 2020-08-05 MED ORDER — TRAMADOL HCL 50 MG PO TABS: 50.0000 mg | ORAL_TABLET | ORAL | 2 refills | Status: DC | PRN

## 2020-08-05 MED ORDER — METHYLPREDNISOLONE ACETATE 80 MG/ML IJ SUSP
INTRAMUSCULAR | Status: AC
  Filled 2020-08-05: qty 1

## 2020-08-05 MED ORDER — ROPIVACAINE HCL 2 MG/ML IJ SOLN
4.0000 mL | Freq: Once | INTRAMUSCULAR | Status: AC
  Administered 2020-08-05: 4 mL via INTRA_ARTICULAR

## 2020-08-05 MED ORDER — METHYLPREDNISOLONE ACETATE 80 MG/ML IJ SUSP
80.0000 mg | Freq: Once | INTRAMUSCULAR | Status: AC
  Administered 2020-08-05: 80 mg via INTRA_ARTICULAR

## 2020-08-05 MED ORDER — LIDOCAINE HCL (PF) 1 % IJ SOLN
INTRAMUSCULAR | Status: AC
  Filled 2020-08-05: qty 5

## 2020-08-05 MED ORDER — LIDOCAINE HCL (PF) 1 % IJ SOLN
5.0000 mL | Freq: Once | INTRAMUSCULAR | Status: AC
  Administered 2020-08-05: 5 mL

## 2020-08-05 NOTE — Patient Instructions (Signed)

## 2020-08-05 NOTE — Progress Notes (Signed)
Safety precautions to be maintained throughout the outpatient stay will include: orient to surroundings, keep bed in low position, maintain call bell within reach at all times, provide assistance with transfer out of bed and ambulation.  

## 2020-08-05 NOTE — Progress Notes (Signed)
PROVIDER NOTE: Information contained herein reflects review and annotations entered in association with encounter. Interpretation of such information and data should be left to medically-trained personnel. Information provided to patient can be located elsewhere in the medical record under "Patient Instructions". Document created using STT-dictation technology, any transcriptional errors that may result from process are unintentional.    Patient: Jean Stout  Service Category: Procedure  Provider: Edward Jolly, MD  DOB: 1941-02-19  DOS: 08/05/2020  Location: [facility]  MRN: [id number]  Setting: Ambulatory - outpatient  Referring Provider: Carren Rang, PA-C  Type: Established Patient  Specialty: Interventional Pain Management  PCP: Carren Rang, PA-C   Primary Reason for Visit: Interventional Pain Management Treatment. CC: Knee Pain  Procedure:          Anesthesia, Analgesia, Anxiolysis:  Type: Therapeutic Intra-Articular Local anesthetic and steroid Knee Injection #2 (for 2022)  Region: Medial infrapatellar Knee Region Level: Knee Joint Laterality: Bilateral  Type: Local Anesthesia Indication(s): Analgesia         Local Anesthetic: Lidocaine 1-2% Route: Infiltration ([place]/IM) IV Access: Declined Sedation: Declined   Position: Sitting   Indications: 1. Bilateral primary osteoarthritis of knee   2. Chronic pain syndrome   3. Localized primary osteoarthritis of shoulder regions, bilateral    Pain Score: Pre-procedure: 5 /10 Post-procedure: 0-No pain/10   Pre-op Assessment:  Jean Stout is a 80 y.o. (year old), female patient, seen today for interventional treatment. She  has a past surgical history that includes Carotid stent; Joint replacement (Bilateral, 2007); and Knee surgery (Left, 2018). Jean Stout has a current medication list which includes the following prescription(s): amlodipine, aspirin ec, atorvastatin, diclofenac sodium, docusate sodium,  duloxetine, duloxetine, famotidine, glucosamine-chondroitin, hydralazine, hydrochlorothiazide, lisinopril, metformin, omeprazole, pantoprazole, and tramadol. Her primarily concern today is the Knee Pain  Initial Vital Signs:  Pulse/HCG Rate: 66  Temp: (!) 97 F (36.1 C) Resp: 18 BP: (!) 145/65 SpO2: 99 %  BMI: Estimated body mass index is 32.92 kg/m as calculated from the following:   Height as of this encounter: 5\' 2"  (1.575 m).   Weight as of this encounter: 180 lb (81.6 kg).  Risk Assessment: Allergies: Reviewed. She is allergic to iodinated diagnostic agents, amlodipine besy-benazepril hcl, bextra [valdecoxib], and gadolinium derivatives.  Allergy Precautions: None required Coagulopathies: Reviewed. None identified.  Blood-thinner therapy: None at this time Active Infection(s): Reviewed. None identified. Jean Stout is afebrile  Site Confirmation: Jean Stout was asked to confirm the procedure and laterality before marking the site Procedure checklist: Completed Consent: Before the procedure and under the influence of no sedative(s), amnesic(s), or anxiolytics, the patient was informed of the treatment options, risks and possible complications. To fulfill our ethical and legal obligations, as recommended by the American Medical Association's Code of Ethics, I have informed the patient of my clinical impression; the nature and purpose of the treatment or procedure; the risks, benefits, and possible complications of the intervention; the alternatives, including doing nothing; the risk(s) and benefit(s) of the alternative treatment(s) or procedure(s); and the risk(s) and benefit(s) of doing nothing. The patient was provided information about the general risks and possible complications associated with the procedure. These may include, but are not limited to: failure to achieve desired goals, infection, bleeding, organ or nerve damage, allergic reactions, paralysis, and death. In addition, the  patient was informed of those risks and complications associated to the procedure, such as failure to decrease pain; infection; bleeding; organ or nerve damage with subsequent damage to sensory, motor,  and/or autonomic systems, resulting in permanent pain, numbness, and/or weakness of one or several areas of the body; allergic reactions; (i.e.: anaphylactic reaction); and/or death. Furthermore, the patient was informed of those risks and complications associated with the medications. These include, but are not limited to: allergic reactions (i.e.: anaphylactic or anaphylactoid reaction(s)); adrenal axis suppression; blood sugar elevation that in diabetics may result in ketoacidosis or comma; water retention that in patients with history of congestive heart failure may result in shortness of breath, pulmonary edema, and decompensation with resultant heart failure; weight gain; swelling or edema; medication-induced neural toxicity; particulate matter embolism and blood vessel occlusion with resultant organ, and/or nervous system infarction; and/or aseptic necrosis of one or more joints. Finally, the patient was informed that Medicine is not an exact science; therefore, there is also the possibility of unforeseen or unpredictable risks and/or possible complications that may result in a catastrophic outcome. The patient indicated having understood very clearly. We have given the patient no guarantees and we have made no promises. Enough time was given to the patient to ask questions, all of which were answered to the patient's satisfaction. Jean Stout has indicated that she wanted to continue with the procedure. Attestation: I, the ordering provider, attest that I have discussed with the patient the benefits, risks, side-effects, alternatives, likelihood of achieving goals, and potential problems during recovery for the procedure that I have provided informed consent. Date  Time: 08/05/2020 12:32 PM  Pre-Procedure  Preparation:  Monitoring: As per clinic protocol. Respiration, ETCO2, SpO2, BP, heart rate and rhythm monitor placed and checked for adequate function Safety Precautions: Patient was assessed for positional comfort and pressure points before starting the procedure. Time-out: I initiated and conducted the "Time-out" before starting the procedure, as per protocol. The patient was asked to participate by confirming the accuracy of the "Time Out" information. Verification of the correct person, site, and procedure were performed and confirmed by me, the nursing staff, and the patient. "Time-out" conducted as per Joint Commission's Universal Protocol (UP.01.01.01). Time: 1254  Description of Procedure:          Target Area: Knee Joint Approach: Just above the Medial tibial plateau, lateral to the infrapatellar tendon. Area Prepped: Entire knee area, from the mid-thigh to the mid-shin. Prepping solution: DuraPrep (Iodine Povacrylex [0.7% available iodine] and Isopropyl Alcohol, 74% w/w) Safety Precautions: Aspiration looking for blood return was conducted prior to all injections. At no point did we inject any substances, as a needle was being advanced. No attempts were made at seeking any paresthesias. Safe injection practices and needle disposal techniques used. Medications properly checked for expiration dates. SDV (single dose vial) medications used. Description of the Procedure: Protocol guidelines were followed. The patient was placed in position over the fluoroscopy table. The target area was identified and the area prepped in the usual manner. Skin & deeper tissues infiltrated with local anesthetic. Appropriate amount of time allowed to pass for local anesthetics to take effect. The procedure needles were then advanced to the target area. Proper needle placement secured. Negative aspiration confirmed. Solution injected in intermittent fashion, asking for systemic symptoms every 0.5cc of injectate. The  needles were then removed and the area cleansed, making sure to leave some of the prepping solution back to take advantage of its long term bactericidal properties. Vitals:   08/05/20 1232 08/05/20 1304  BP: (!) 145/65 139/74  Pulse: 66 (!) 59  Resp: 18 16  Temp: (!) 97 F (36.1 C)   TempSrc: Temporal  SpO2: 99% 99%  Weight: 180 lb (81.6 kg)   Height: 5\' 2"  (1.575 m)     Start Time: 1254 hrs. End Time: 1303 hrs. Materials:  Needle(s) Type: Regular needle Gauge: 25G Length: 1.5-in Medication(s): Please see orders for medications and dosing details. 5 cc solution made of 4.5 cc of 0.2% ropivacaine, 0.5 cc of methylprednisolone, 80 mg/cc injected for right knee 5 cc solution made of 4.5 cc of 0.2% ropivacaine, 0.5 cc of methylprednisolone, 80 mg/cc injected for left   knee    Pharmacotherapy Assessment   Analgesic: Tramadol 50 mg every 4 hours as needed, quantity 150/month maximum 5 tablets a day; MME equals 25   Monitoring: [place] PMP: PDMP reviewed during this encounter.       Pharmacotherapy: No side-effects or adverse reactions reported. Compliance: No problems identified. Effectiveness: Clinically acceptable.  UDS:  Summary  Date Value Ref Range Status  03/19/2020 Note  Final    Comment:    ==================================================================== [employer] Select 13 (MW) ==================================================================== Test                             Result       Flag       Units  Drug Present and Declared for Prescription Verification   Tramadol                       >3497        EXPECTED   ng/mg creat   O-Desmethyltramadol            >3497        EXPECTED   ng/mg creat   N-Desmethyltramadol            >3497        EXPECTED   ng/mg creat    Source of tramadol is a prescription medication. O-desmethyltramadol    and N-desmethyltramadol are expected metabolites of  tramadol.  ==================================================================== Test                      Result    Flag   Units      Ref Range   Creatinine              143              mg/dL      03/21/2020 ==================================================================== Declared Medications:  The flagging and interpretation on this report are based on the  following declared medications.  Unexpected results may arise from  inaccuracies in the declared medications.   **Note: The testing scope of this panel includes these medications:   Tramadol (Ultram)   **Note: The testing scope of this panel does not include the  following reported medications:   Amlodipine (Norvasc)  Aspirin  Atorvastatin (Lipitor)  Cetirizine (Zyrtec)  Diclofenac  Docusate (Colace)  Duloxetine (Cymbalta)  Famotidine (Pepcid)  Hydralazine (Apresoline)  Hydrochlorothiazide (Hydrodiuril)  Lisinopril (Zestril)  Metformin (Glucophage)  Omeprazole (Prilosec)  Pantoprazole (Protonix) ==================================================================== For clinical consultation, please call [phone]. ====================================================================      Patient counseled on risk of serotonin syndrome with tramadol and Cymbalta use.  She is on a high dose of Cymbalta at 120 mg.  I encouraged her to discuss this with her primary care provider and see if a lower dose at 90 mg will decrease risk of serotonin syndrome.   Post-operative Assessment:  Post-procedure Vital Signs:  Pulse/HCG Rate: (!) 59  Temp: [phone])  97 F (36.1 C) Resp: 16 BP: 139/74 SpO2: 99 %  EBL: None  Complications: No immediate post-treatment complications observed by team, or reported by patient.  Note: The patient tolerated the entire procedure well. A repeat set of vitals were taken after the procedure and the patient was kept under observation following institutional policy, for this type of procedure.  Post-procedural neurological assessment was performed, showing return to baseline, prior to discharge. The patient was provided with post-procedure discharge instructions, including a section on how to identify potential problems. Should any problems arise concerning this procedure, the patient was given instructions to immediately contact us, at any time, without hesitation. In any case, we plan to contact the patient by telephone for a follow-up status report regarding this interventional procedure.  Comments:  No additional relevant information.  Plan of Care   Medications ordered for procedure: Meds ordered this encounter  Medications  . methylPREDNISolone acetate (DEPO-MEDROL) injection 80 mg  . ropivacaine (PF) 2 mg/mL (0.2%) (NAROPIN) injection 4 mL  . lidocaine (PF) (XYLOCAINE) 1 % injection 5 mL  . traMADol (ULTRAM) 50 MG tablet    Sig: Take 1 tablet (50 mg total) by mouth every 4 (four) hours as needed. For chronic pain syndrome.  Maximum 5 tablets/day.  Each prescription to last 30 days.    Dispense:  150 tablet    Refill:  2   Medications administered: We administered methylPREDNISolone acetate, ropivacaine (PF) 2 mg/mL (0.2%), and lidocaine (PF).  See the medical record for exact dosing, route, and time of administration.  Follow-up plan:   Return in about 27 days (around 09/01/2020) for Post Procedure Evaluation (discuss GNB knee).      Left intra-articular knee steroid injection on 05/27/2019, bilateral knee steroid injection 08/21/2019, 11/13/19, 02/05/20.  Has previously had 5 series of viscosupplementation at outside clinic.  States it was not effective.  Status post genicular nerve block which was not effective.  Can consider sprint PNS in future   Recent Visits Date Type Provider Dept  05/19/20 Office Visit Edward Jolly, MD [facility]  Showing recent visits within past 90 days and meeting all other requirements Today's Visits Date Type Provider Dept  08/05/20  Procedure visit Edward Jolly, MD [facility]  Showing today's visits and meeting all other requirements Future Appointments Date Type Provider Dept  09/15/20 Appointment Edward Jolly, MD [facility]  Showing future appointments within next 90 days and meeting all other requirements  Disposition: Discharge home  Discharge (Date  Time): 08/05/2020;   hrs.   Primary Care Physician: Carren Rang, PA-C Location: [facility] Note by: Edward Jolly, MD Date: 08/05/2020; Time: 2:05 PM  Disclaimer:  Medicine is not an exact science. The only guarantee in medicine is that nothing is guaranteed. It is important to note that the decision to proceed with this intervention was based on the information collected from the patient. The Data and conclusions were drawn from the patient's questionnaire, the interview, and the physical examination. Because the information was provided in large part by the patient, it cannot be guaranteed that it has not been purposely or unconsciously manipulated. Every effort has been made to obtain as much relevant data as possible for this evaluation. It is important to note that the conclusions that lead to this procedure are derived in large part from the available data. Always take into account that the treatment will also be dependent on availability of resources and existing treatment guidelines, considered by  other Pain Management Practitioners as being common knowledge and practice, at the time of the intervention. For Medico-Legal purposes, it is also important to point out that variation in procedural techniques and pharmacological choices are the acceptable norm. The indications, contraindications, technique, and results of the above procedure should only be interpreted and judged by a Board-Certified Interventional Pain Specialist with extensive familiarity and expertise in the same exact procedure and technique.

## 2020-08-06 ENCOUNTER — Telehealth: Payer: Self-pay | Admitting: *Deleted

## 2020-08-06 NOTE — Telephone Encounter (Signed)
No problems post procedure. 

## 2020-08-11 ENCOUNTER — Encounter: Payer: Medicare Other | Admitting: Student in an Organized Health Care Education/Training Program

## 2020-08-23 ENCOUNTER — Encounter: Payer: Self-pay | Admitting: Intensive Care

## 2020-08-23 ENCOUNTER — Emergency Department
Admission: EM | Admit: 2020-08-23 | Discharge: 2020-08-23 | Disposition: A | Discharge status: Home / Self Care | Payer: Medicare Other | Attending: Emergency Medicine | Admitting: Emergency Medicine

## 2020-08-23 ENCOUNTER — Other Ambulatory Visit: Payer: Self-pay

## 2020-08-23 ENCOUNTER — Emergency Department: Payer: Medicare Other

## 2020-08-23 DIAGNOSIS — Z7984 Long term (current) use of oral hypoglycemic drugs: Secondary | ICD-10-CM | POA: Diagnosis not present

## 2020-08-23 DIAGNOSIS — S32029A Unspecified fracture of second lumbar vertebra, initial encounter for closed fracture: Secondary | ICD-10-CM | POA: Diagnosis not present

## 2020-08-23 DIAGNOSIS — L03116 Cellulitis of left lower limb: Secondary | ICD-10-CM

## 2020-08-23 DIAGNOSIS — Z7982 Long term (current) use of aspirin: Secondary | ICD-10-CM | POA: Insufficient documentation

## 2020-08-23 DIAGNOSIS — Z79899 Other long term (current) drug therapy: Secondary | ICD-10-CM | POA: Diagnosis not present

## 2020-08-23 DIAGNOSIS — Y92009 Unspecified place in unspecified non-institutional (private) residence as the place of occurrence of the external cause: Secondary | ICD-10-CM | POA: Insufficient documentation

## 2020-08-23 DIAGNOSIS — E119 Type 2 diabetes mellitus without complications: Secondary | ICD-10-CM | POA: Insufficient documentation

## 2020-08-23 DIAGNOSIS — K120 Recurrent oral aphthae: Secondary | ICD-10-CM | POA: Insufficient documentation

## 2020-08-23 DIAGNOSIS — S32020A Wedge compression fracture of second lumbar vertebra, initial encounter for closed fracture: Secondary | ICD-10-CM

## 2020-08-23 DIAGNOSIS — W19XXXD Unspecified fall, subsequent encounter: Secondary | ICD-10-CM

## 2020-08-23 DIAGNOSIS — I10 Essential (primary) hypertension: Secondary | ICD-10-CM | POA: Diagnosis not present

## 2020-08-23 DIAGNOSIS — I2510 Atherosclerotic heart disease of native coronary artery without angina pectoris: Secondary | ICD-10-CM | POA: Insufficient documentation

## 2020-08-23 DIAGNOSIS — W19XXXA Unspecified fall, initial encounter: Secondary | ICD-10-CM | POA: Diagnosis not present

## 2020-08-23 DIAGNOSIS — S3992XA Unspecified injury of lower back, initial encounter: Secondary | ICD-10-CM | POA: Diagnosis present

## 2020-08-23 MED ORDER — MAGIC MOUTHWASH W/LIDOCAINE: 5.0000 mL | Freq: Four times a day (QID) | ORAL | 0 refills | Status: DC | PRN

## 2020-08-23 MED ORDER — CEPHALEXIN 500 MG PO CAPS: 500.0000 mg | ORAL_CAPSULE | Freq: Four times a day (QID) | ORAL | 0 refills | Status: AC

## 2020-08-23 MED ORDER — CYCLOBENZAPRINE HCL 5 MG PO TABS: 5.0000 mg | ORAL_TABLET | Freq: Three times a day (TID) | ORAL | 0 refills | Status: DC | PRN

## 2020-08-23 MED ORDER — LIDOCAINE VISCOUS HCL 2 % MT SOLN
15.0000 mL | Freq: Once | OROMUCOSAL | Status: AC
  Administered 2020-08-23: 15 mL via OROMUCOSAL
  Filled 2020-08-23: qty 15

## 2020-08-23 MED ORDER — CYCLOBENZAPRINE HCL 10 MG PO TABS
5.0000 mg | ORAL_TABLET | Freq: Once | ORAL | Status: AC
  Administered 2020-08-23: 5 mg via ORAL
  Filled 2020-08-23: qty 1

## 2020-08-23 MED ORDER — DIPHENHYDRAMINE HCL 25 MG PO CAPS
25.0000 mg | ORAL_CAPSULE | Freq: Once | ORAL | Status: AC
  Administered 2020-08-23: 25 mg via ORAL
  Filled 2020-08-23: qty 1

## 2020-08-23 MED ORDER — CEPHALEXIN 500 MG PO CAPS
500.0000 mg | ORAL_CAPSULE | Freq: Once | ORAL | Status: AC
  Administered 2020-08-23: 500 mg via ORAL
  Filled 2020-08-23: qty 1

## 2020-08-23 NOTE — ED Triage Notes (Signed)
Patient was seen at [facility] two days ago and given vancomycin for bilateral cellulitis. Patient reports she is itching all over and believes it is from the antibiotic. Also reports pain medicine is not working.

## 2020-08-23 NOTE — ED Notes (Signed)
See triage note  Presents s/p fall last week  Having pain to right lower back  Also has redness and swelling to right lower leg  3+ edema noted   Was given Vanco and developed a rash   Thinks she may be allergic to [facility]

## 2020-08-23 NOTE — Discharge Instructions (Signed)
Your exam and CT scan reveals a new acute compression fracture of the lumbar spine. Take the meds as directed. Continue with the previously prescribed doxycycline. Follow-up with your provider for continued pain and management. Return to the ED as needed.

## 2020-08-23 NOTE — ED Provider Notes (Addendum)
[facility] Emergency Department Provider Note  ____________________________________________   Event Date/Time   First MD Initiated Contact with Patient 08/23/20 1241     (approximate)  I have reviewed the triage vital signs and the nursing notes.   HISTORY  Chief Complaint Cellulitis and Allergic Reaction  HPI Jean Stout is a 80 y.o. female presented to the ED for evaluation of possible allergic reaction to doxycycline, and also ongoing back pain.  Patient reports a mechanical fall on Monday at home.  He apparently presented herself to [facility] 2 days ago for evaluation of her complaints.  She was started on a course of doxycycline for bilateral nonpurulent cellulitis of the lower extremities.  She was also evaluated with an x-ray due to her mechanical fall and complaints of back pain.  The wet read on the x-ray was concerning for possible L2 compression fracture of an unknown age.  Patient was started on Skelaxin and doxycycline as noted in the chart.  She presents today, noting some generalized itching that began last night.  She denies any frank rash or hives.  She also denies any difficulty breathing, swallowing, or controlling oral secretions.  Patient has since developed some superficial bruising related to the mechanical fall along the right lower extremity.  She also endorses some midline low back pain.  She denies any head injury, nausea, vomiting, or syncope.  She also denies any distal paresthesias, bladder or bowel incontinence, saddle anesthesias, or foot drop.  She has been taking the doxycycline with limited benefit.  Past Medical History:  Diagnosis Date  . Arthritis   . Coronary artery disease   . DM (diabetes mellitus) (HCC)   . GERD (gastroesophageal reflux disease)   . HLD (hyperlipidemia)   . HTN (hypertension)     Patient Active Problem List   Diagnosis Date Noted  . Bilateral primary osteoarthritis of knee 05/19/2020  . MDD (major  depressive disorder), recurrent episode, moderate (HCC) 11/02/2018  . GAD (generalized anxiety disorder) 11/02/2018  . Panic attacks 11/02/2018  . Insomnia due to mental condition 11/02/2018  . Primary osteoarthritis of left knee 10/09/2018  . Localized primary osteoarthritis of shoulder regions, bilateral 10/09/2018  . Right wrist pain 10/09/2018  . Chronic pain of right ankle 10/09/2018  . Chronic pain syndrome 10/09/2018  . Polyarthralgia 10/09/2018  . History of bilateral hip replacements 10/09/2018  . Aortic stenosis, moderate 03/13/2018  . Coronary artery disease involving native coronary artery of native heart without angina pectoris 03/01/2018  . Depression 03/01/2018  . Essential hypertension 03/01/2018  . GERD without esophagitis 03/01/2018  . Hyperlipidemia, mixed 03/01/2018  . Iron deficiency anemia 03/01/2018  . OSA on CPAP 03/01/2018  . Type 2 diabetes mellitus without complication, without long-term current use of insulin (HCC) 03/01/2018  . Osteoarthritis of multiple joints 03/01/2018    Past Surgical History:  Procedure Laterality Date  . CAROTID STENT    . JOINT REPLACEMENT Bilateral 2007  . KNEE SURGERY Left 2018    Prior to Admission medications   Medication Sig Start Date End Date Taking? Authorizing Provider  cephALEXin (KEFLEX) 500 MG capsule Take 1 capsule (500 mg total) by mouth 4 (four) times daily for 7 days. 08/23/20 08/30/20 Yes Pria Klosinski, Charlesetta Ivory, PA-C  cyclobenzaprine (FLEXERIL) 5 MG tablet Take 1 tablet (5 mg total) by mouth 3 (three) times daily as needed. 08/23/20  Yes Solara Goodchild, Charlesetta Ivory, PA-C  magic mouthwash w/lidocaine SOLN Take 5 mLs by mouth 4 (four) times  daily as needed for mouth pain. 08/23/20  Yes Euna Armon, Charlesetta Ivory, PA-C  amLODipine (NORVASC) 10 MG tablet Take 5 mg by mouth daily.  03/01/18   [provider]  aspirin EC 81 MG tablet Take 81 mg by mouth daily.     [provider]  atorvastatin (LIPITOR) 40 MG  tablet Take 40 mg by mouth daily at 6 PM.  03/01/18   [provider]  Diclofenac Sodium (VOLTAREN TD) Place onto the skin.    [provider]  docusate sodium (COLACE) 100 MG capsule Take by mouth every 3 (three) days.     [provider]  DULoxetine (CYMBALTA) 30 MG capsule 30 mg (to be taken with 60 mg for 90 mg total) 05/19/20   Edward Jolly, MD  DULoxetine (CYMBALTA) 60 MG capsule 60 mg (to be taken with 30 mg for total 90 mg) 05/19/20   Edward Jolly, MD  famotidine (PEPCID) 40 MG tablet Take by mouth. 12/17/19 12/16/20  [provider]  glucosamine-chondroitin 500-400 MG tablet Take 1 tablet by mouth 1 day or 1 dose. Takes 1500 mg  daily    [provider]  hydrALAZINE (APRESOLINE) 50 MG tablet Take by mouth 3 (three) times daily.  04/04/18   [provider]  hydrochlorothiazide (HYDRODIURIL) 12.5 MG tablet Take 12.5 mg by mouth daily.  03/01/18   [provider]  lisinopril (PRINIVIL,ZESTRIL) 40 MG tablet Take 40 mg by mouth daily.  03/01/18   [provider]  metFORMIN (GLUCOPHAGE-XR) 500 MG 24 hr tablet Take 500 mg by mouth daily with breakfast.  03/01/18   [provider]  omeprazole (PRILOSEC) 20 MG capsule Take by mouth. 12/19/19 12/18/20  [provider]  pantoprazole (PROTONIX) 40 MG tablet Take 40 mg by mouth daily.  03/01/18   [provider]  traMADol (ULTRAM) 50 MG tablet Take 1 tablet (50 mg total) by mouth every 4 (four) hours as needed. For chronic pain syndrome.  Maximum 5 tablets/day.  Each prescription to last 30 days. 08/05/20 11/03/20  Edward Jolly, MD    Allergies Iodinated diagnostic agents, Amlodipine besy-benazepril hcl, Bextra [valdecoxib], and Gadolinium derivatives  Family History  Problem Relation Age of Onset  . Bipolar disorder Sister     Social History Social History   Tobacco Use  . Smoking status: Never Smoker  . Smokeless tobacco: Never Used  Vaping Use  .  Vaping Use: Never used  Substance Use Topics  . Alcohol use: Not Currently  . Drug use: Never    Review of Systems  Constitutional: No fever/chills Eyes: No visual changes. ENT: No sore throat. Cardiovascular: Denies chest pain. Respiratory: Denies shortness of breath. Gastrointestinal: No abdominal pain.  No nausea, no vomiting.  No diarrhea.  No constipation. Genitourinary: Negative for dysuria. Musculoskeletal: Positive for back pain. Skin: Negative for rash.  Reports itching as above Neurological: Negative for headaches, focal weakness or numbness. ____________________________________________   PHYSICAL EXAM:  VITAL SIGNS: ED Triage Vitals  Enc Vitals Group     BP 08/23/20 1233 (!) 155/59     Pulse Rate 08/23/20 1233 66     Resp 08/23/20 1233 16     Temp 08/23/20 1233 99.4 F (37.4 C)     Temp Source 08/23/20 1233 Oral     SpO2 08/23/20 1233 96 %     Weight 08/23/20 1234 180 lb (81.6 kg)     Height 08/23/20 1234 5\' 2"  (1.575 m)     Head Circumference --  Peak Flow --      Pain Score 08/23/20 1233 8     Pain Loc --      Pain Edu? --      Excl. in GC? --     Constitutional: Alert and oriented. Well appearing and in no acute distress. Eyes: Conjunctivae are normal. PERRL. EOMI. Head: Atraumatic. Nose: No congestion/rhinnorhea. Mouth/Throat: Mucous membranes are moist.  Oropharynx non-erythematous.  Single aphthous ulcer noted to the right lateral tongue. Neck: No stridor.  No cervical spine tenderness to palpation. Cardiovascular: Normal rate, regular rhythm. Grossly normal heart sounds.  Good peripheral circulation. Respiratory: Normal respiratory effort.  No retractions. Lungs CTAB. Gastrointestinal: Soft and nontender. No distention. No abdominal bruits. No CVA tenderness. Musculoskeletal: Normal spinal alignment with some mild midline tenderness to palpation along the lumbar sacral junction.  No tenderness to the sacrum or tailbone.  Slow transition to the  left lateral decubitus position secondary to pain.  No lower extremity tenderness nor edema.  No joint effusions. Neurologic:  Normal speech and language. No gross focal neurologic deficits are appreciated. No gait instability. Skin:  Skin is warm, dry and intact. No rash noted.  Areas of ecchymosis noted to the right lower extremity consistent with mechanical fall. Erythema to the BLE without purulence, weeping, or skin breakdown. Psychiatric: Mood and affect are normal. Speech and behavior are normal.  ____________________________________________   LABS (all labs ordered are listed, but only abnormal results are displayed)  Labs Reviewed - No data to display ____________________________________________  EKG  ____________________________________________  RADIOLOGY I, Lissa HoardJenise V Bacon-Cindra Austad, personally viewed and evaluated these images (plain radiographs) as part of my medical decision making, as well as reviewing the written report by the radiologist.  ED MD interpretation:  Agree with report  Official radiology report(s): CT Lumbar Spine Wo Contrast  Result Date: 08/23/2020 CLINICAL DATA:  Fall last week with right lower back pain. EXAM: CT LUMBAR SPINE WITHOUT CONTRAST TECHNIQUE: Multidetector CT imaging of the lumbar spine was performed without intravenous contrast administration. Multiplanar CT image reconstructions were also generated. COMPARISON:  None. FINDINGS: Segmentation: 5 non rib-bearing lumbar type vertebral bodies. Alignment: Grade 1 anterolisthesis of L5 on S1, most likely degenerative given severe facet arthropathy at this level. Vertebrae: Acute L2 compression fracture with 20% height loss anteriorly. There is a lucent fracture line through the anterior/superior cortex with slight cortical offset. Slight superior endplate trabecular sclerosis. No appreciable bony retropulsion or evidence of involvement of the posterior elements. Otherwise, vertebral body heights are  maintained. Diffuse osteopenia. Paraspinal and other soft tissues: Aorto bi-iliac calcific atherosclerosis. No acute findings. Disc levels: Multilevel degenerative change with potentially severe canal stenosis at L3-L4 and and least moderate canal stenosis at L4-L5 with subarticular recess narrowing. Severe lower lumbar facet hypertrophy with potentially severe foraminal stenosis on the left at L4-L5. IMPRESSION: 1. Acute L2 superior endplate fracture with 20% vertebral body height loss. No appreciable bony retropulsion. 2. Multilevel degenerative change with likely severe canal stenosis at L3-L4, at least moderate canal stenosis at L4-L5, and potentially severe left foraminal stenosis at L4-L5. An MRI could better characterize the canal/cord/foramina if clinically indicated. 3. Grade 1 anterolisthesis of L5 on S1, most likely degenerative given severe facet arthropathy at this level. 4. Diffuse osteopenia. Electronically Signed   By: Feliberto HartsFrederick S Jones MD   On: 08/23/2020 14:15  ____________________________________________   PROCEDURES  Procedure(s) performed (including Critical Care):  Procedures  Flexeril 5 mg PO Keflex 500 mg PO Benadryl 25 mg PO Viscous  lido 2% gargle ____________________________________________   INITIAL IMPRESSION / ASSESSMENT AND PLAN / ED COURSE  As part of my medical decision making, I reviewed the following data within the electronic MEDICAL RECORD NUMBER Old chart reviewed, Radiograph reviewed as above and Notes from prior ED visits  DDX cellulitis, drug reaction, compression fracture, DDD, radiculopathy  Geriatric patient with ED evaluation of injury sustained following mechanical fall.  Patient was initially evaluated at [facility] for injuries related to the fall.  She was started on doxycycline for bilateral lower extremity cellulitis, and Skelaxin for lumbar contusion and strain.  There was a question about a possible L2 compression fracture, age indeterminate.  Patient  with CT evaluation of the lumbar spine which does confirm an acute L2 compression fracture.  Patient will be treated for symptoms with prescriptions for cyclobenzaprine, as well as an addition of Keflex to her antibiotic course for nonpurulent cellulitis.  She will also be treated with Magic mouthwash for her aphthous ulcer.  She will follow-up with her primary provider for ongoing symptoms or return to the ED if necessary.  Opportunity was provided for the patient and her adult niece to answer questions.  They are overall reassured at this time medications otherwise normal work-up and a diagnosis for her ongoing back pain. ____________________________________________   FINAL CLINICAL IMPRESSION(S) / ED DIAGNOSES  Final diagnoses:  Fall at home, subsequent encounter  Cellulitis of left lower extremity  Compression fracture of L2 lumbar vertebra, closed, initial encounter (HCC)  Aphthous ulcer of tongue     ED Discharge Orders         Ordered    cephALEXin (KEFLEX) 500 MG capsule  4 times daily        08/23/20 1456    magic mouthwash w/lidocaine SOLN  4 times daily PRN        08/23/20 1456    cyclobenzaprine (FLEXERIL) 5 MG tablet  3 times daily PRN        08/23/20 1456          *Please note:  Rashia Mckesson was evaluated in Emergency Department on 08/23/2020 for the symptoms described in the history of present illness. She was evaluated in the context of the global COVID-19 pandemic, which necessitated consideration that the patient might be at risk for infection with the SARS-CoV-2 virus that causes COVID-19. Institutional protocols and algorithms that pertain to the evaluation of patients at risk for COVID-19 are in a state of rapid change based on information released by regulatory bodies including the CDC and federal and state organizations. These policies and algorithms were followed during the patient's care in the ED.  Some ED evaluations and interventions may be delayed as a result  of limited staffing during and the pandemic.*   Note:  This document was prepared using Dragon voice recognition software and may include unintentional dictation errors.    Lissa Hoard, PA-C 08/23/20 1547    Lissa Hoard, PA-C 08/23/20 1547    Jene Every, MD 08/24/20 [phone]

## 2020-08-25 ENCOUNTER — Other Ambulatory Visit
Admission: RE | Admit: 2020-08-25 | Discharge: 2020-08-25 | Disposition: A | Discharge status: Home / Self Care | Payer: Medicare Other | Source: Ambulatory Visit | Attending: Pediatrics | Admitting: Pediatrics

## 2020-08-25 DIAGNOSIS — S32000A Wedge compression fracture of unspecified lumbar vertebra, initial encounter for closed fracture: Secondary | ICD-10-CM | POA: Diagnosis present

## 2020-08-25 DIAGNOSIS — R600 Localized edema: Secondary | ICD-10-CM | POA: Insufficient documentation

## 2020-08-25 LAB — BRAIN NATRIURETIC PEPTIDE: B Natriuretic Peptide: 162.3 pg/mL — ABNORMAL HIGH (ref 0.0–100.0)

## 2020-09-04 NOTE — Progress Notes (Signed)
Perioperative Services  Pre-Admission/Anesthesia Testing Clinical Review  Date: 09/07/20  Patient Demographics:  Name: Jean Stout DOB:   01-13-1941 MRN:   [id number]  Planned Surgical Procedure(s):    Case: [id number] Date/Time: 09/08/20 1607   Procedure: L2  KYPHOPLASTY   Anesthesia type: Choice   Pre-op diagnosis: Closed compression fracture of L2 vertebra, initial encounter S32.020A   Location: [place] [facility] / [place] [facility]   Surgeons: Hessie Knows, MD     NOTE: Available PAT nursing documentation and vital signs have been reviewed. Clinical nursing staff has updated patient's PMH/PSHx, current medication list, and drug allergies/intolerances to ensure comprehensive history available to assist in medical decision making as it pertains to the aforementioned surgical procedure and anticipated anesthetic course. Extensive review of available clinical information performed. [place] PM[facility] and PSHx updated with any diagnoses/procedures that  may have been inadvertently omitted during her intake with the pre-admission testing department's nursing staff.  Clinical Discussion:  Jean Stout is a 80 y.o. female who is submitted for pre-surgical anesthesia review and clearance prior to her undergoing the above procedure. Patient has never been a smoker. Pertinent PMH includes: CAD, MI, aortic stenosis, mild MV regurgitation, HTN, HLD, T2DM, OSAH (requires nocturnal PAP therapy), GERD (on daily PPI), OA, diffuse osteopenia, anxiety, depression.  Patient is followed by cardiology Ubaldo Glassing, MD). She was last seen in the cardiology clinic on 05/20/2020; notes reviewed.  At the time of her clinic visit, patient doing well overall from a cardiovascular perspective.  Patient denied any chest pain, shortness breath, PND, orthopnea, palpitations, significant peripheral edema, vertiginous symptoms, or presyncope/syncope.  Patient with asymptomatic bradycardia. Patient with a past medical  history significant for cardiovascular diagnoses.  Information limited as patient has received care in [place], [place], and here in [place].  Information regarding remote cardiac history obtained from local cardiologist's documentation.  Patient suffered a MI in 2010 while living in [place].  She reportedly underwent PCI with stent placement x 2.  Degree of stenosis and location of stent placement unknown.  Patient underwent TTE in 08/2017 that revealed normal left ventricular systolic function with an estimated EF of 55%.  There was mild aortic valve calcification and mild mitral valve regurgitation noted.  Myocardial perfusion imaging study performed on 07/23/2018 revealed an LVEF of 58% with no evidence of stress-induced myocardial ischemia or arrhythmia (see full interpretation of available cardiovascular testing below).  Patient on GDMT for her HTN and HLD diagnoses.  Blood pressure well controlled at 122/84 on currently prescribed CCB, diuretic, ACEi, and vasodilator therapies.  Patient is on a statin for her HLD. T2DM well controlled on currently prescribed regimen; Hgb A1c 6.7% when last checked on 05/29/2020. She has a diagnosis of controlled OSAH; compliant with nocturnal PAP therapy. Functional capacity, as defined by DASI, is documented as being >/= 4 METS.  No changes were made to patient's medication regimen.  Patient to follow-up with outpatient cardiology in 6 months or sooner if needed.  Patient is scheduled for an L2 kyphoplasty on 09/08/2020 with Dr. Hessie Knows.  Given patient's past medical history significant for cardiovascular diagnoses, presurgical cardiac clearance was sought by the PAT team. Per cardiology, "this patient is optimized for surgery and may proceed with the planned procedural course with an ACCEPTABLE risk stratification".  This patient is on daily antiplatelet therapy. She has been instructed on recommendations for holding her daily low dose ASA for  4 days prior to her procedure with plans to restart  as soon as postoperative bleeding risk felt to be minimized by her attending surgeon. The patient has been instructed that her last dose of her anticoagulant will be on 09/03/2020.  Patient denies previous perioperative complications with anesthesia in the past.  In review of patient's EMR, there is no record of patient undergoing surgical/anesthetic course within the [facility] system.  Vitals with BMI 09/07/2020 08/23/2020 08/23/2020  Height _0  - _1   Weight 180 lbs - 180 lbs  BMI 36.46 - 80.32  Systolic - 122 -  Diastolic - 95 -  Pulse - 68 -  Some encounter information is confidential and restricted. Go to Review Flowsheets activity to see all data.    Providers/Specialists:   NOTE: Primary physician provider listed below. Patient may have been seen by APP or partner within same practice.   PROVIDER ROLE / SPECIALTY LAST Fabio Bering, MD  Orthopedics (Surgeon) 08/27/2020  Gladstone Lighter, MD  Primary Care Provider 07/07/2020  Bartholome Bill, MD  Cardiology 05/20/2020   Allergies:  Gadolinium derivatives, Iodinated diagnostic agents, and Bextra [valdecoxib]  Current Home Medications:    amLODipine (NORVASC) 10 MG tablet   aspirin EC 81 MG tablet   atorvastatin (LIPITOR) 40 MG tablet   Baclofen 5 MG TABS   calcitonin, salmon, (MIACALCIN/FORTICAL) 200 UNIT/ACT nasal spray   cholecalciferol (VITAMIN D3) 25 MCG (1000 UNIT) tablet   Cinnamon 500 MG capsule   diclofenac Sodium (VOLTAREN) 1 % GEL   DULoxetine (CYMBALTA) 60 MG capsule   famotidine (PEPCID) 40 MG tablet   glucosamine-chondroitin 500-400 MG tablet   hydrALAZINE (APRESOLINE) 50 MG tablet   hydrochlorothiazide (HYDRODIURIL) 12.5 MG tablet   HYDROcodone-acetaminophen (NORCO/VICODIN) 5-325 MG tablet   lisinopril (PRINIVIL,ZESTRIL) 40 MG tablet   Magnesium 500 MG CAPS   metFORMIN (GLUCOPHAGE-XR) 500 MG 24 hr tablet   Multiple Vitamins-Minerals  (MULTIVITAMIN WITH MINERALS) tablet   omeprazole (PRILOSEC) 20 MG capsule   traMADol (ULTRAM) 50 MG tablet   cyclobenzaprine (FLEXERIL) 5 MG tablet   doxycycline (VIBRA-TABS) 100 MG tablet   DULoxetine (CYMBALTA) 30 MG capsule   magic mouthwash w/lidocaine SOLN   No current facility-administered medications for this encounter.   History:   Past Medical History:  Diagnosis Date   Anxiety and depression    Aortic stenosis    Arthritis    Cardiac murmur    GRADE I/VI medium pitched mid systolic blowing at lower LSB   CKD (chronic kidney disease), stage III (HCC)    Coronary artery disease    GERD (gastroesophageal reflux disease)    History of MI (myocardial infarction) 2010   HLD (hyperlipidemia)    HTN (hypertension)    IDA (iron deficiency anemia)    Mild mitral regurgitation    OSA on CPAP    Osteopenia    PONV (postoperative nausea and vomiting)    T2DM (type 2 diabetes mellitus) ([place])    Vaginal prolapse    Past Surgical History:  Procedure Laterality Date   CAROTID STENT     CARPAL TUNNEL RELEASE Left 1983   CORONARY ANGIOPLASTY WITH STENT PLACEMENT N/A 2010   stents x 2 placed (unknown type and location); Location: [facility]   from Left side of neck   EYE SURGERY     JOINT REPLACEMENT Bilateral 2007   KNEE SURGERY Left 2018   Family History  Problem Relation Age of Onset   Hypertension Mother    Anxiety disorder Mother  Heart attack Father    Sudden Cardiac Death Father 66   Bipolar disorder Sister    Sudden Cardiac Death Brother 43   Cancer Daughter        Breast   Hypertension Son    Hyperlipidemia Son    Heart attack Son    Sleep apnea Son    Social History   Tobacco Use   Smoking status: Never   Smokeless tobacco: Never  Vaping Use   Vaping Use: Never used  Substance Use Topics   Alcohol use: Not Currently   Drug use: Never    Pertinent Clinical Results:  LABS: Labs reviewed: Acceptable for  surgery.  Lab Results  Component Value Date   BNP 162.3 (H) 08/25/2020     Ref Range & Units 13 d ago  WBC (White Blood Cell Count) 4.1 - 10.2 10^3/uL 6.4   RBC (Red Blood Cell Count) 4.04 - 5.48 10^6/uL 3.50 Low    Hemoglobin 12.0 - 15.0 gm/dL 10.6 Low    Hematocrit 35.0 - 47.0 % 32.9 Low    MCV (Mean Corpuscular Volume) 80.0 - 100.0 fl 94.0   MCH (Mean Corpuscular Hemoglobin) 27.0 - 31.2 pg 30.3   MCHC (Mean Corpuscular Hemoglobin Concentration) 32.0 - 36.0 gm/dL 32.2   Platelet Count 150 - 450 10^3/uL 257   RDW-CV (Red Cell Distribution Width) 11.6 - 14.8 % 14.2   MPV (Mean Platelet Volume) 9.4 - 12.4 fl 9.2 Low    Neutrophils 1.50 - 7.80 10^3/uL 3.35   Lymphocytes 1.00 - 3.60 10^3/uL 2.11   Monocytes 0.00 - 1.50 10^3/uL 0.56   Eosinophils 0.00 - 0.55 10^3/uL 0.28   Basophils 0.00 - 0.09 10^3/uL 0.07   Neutrophil % 32.0 - 70.0 % 52.4   Lymphocyte % 10.0 - 50.0 % 33.1   Monocyte % 4.0 - 13.0 % 8.8   Eosinophil % 1.0 - 5.0 % 4.4   Basophil% 0.0 - 2.0 % 1.1   Immature Granulocyte % <=0.7 % 0.2   Immature Granulocyte Count <=0.06 10^3/L 0.01   Resulting Agency  [facility]  Specimen Collected: 08/25/20 17:00 Last Resulted: 08/25/20 17:14  Received From: [facility]  Result Received: 08/27/20 11:07     Ref Range & Units 13 d ago  Glucose 70 - 110 mg/dL 103   Sodium 136 - 145 mmol/L 138   Potassium 3.6 - 5.1 mmol/L 4.2   Chloride 97 - 109 mmol/L 102   Carbon Dioxide (CO2) 22.0 - 32.0 mmol/L 28.8   Urea Nitrogen (BUN) 7 - 25 mg/dL 27 High    Creatinine 0.6 - 1.1 mg/dL 1.1   Glomerular Filtration Rate (eGFR), MDRD Estimate >60 mL/min/1.73sq m 48 Low    Calcium 8.7 - 10.3 mg/dL 9.4   AST  8 - 39 U/L 34   ALT  5 - 38 U/L 27   Alk Phos (alkaline Phosphatase) 34 - 104 U/L 80   Albumin 3.5 - 4.8 g/dL 4.1   Bilirubin, Total 0.3 - 1.2 mg/dL 0.5   Protein, Total 6.1 - 7.9 g/dL 7.0   A/G Ratio 1.0 - 5.0 gm/dL 1.4   Resulting Agency  [facility]  Specimen Collected: 08/25/20 17:00 Last Resulted: 08/25/20 17:36  Received From: [facility]  Result Received: 08/27/20 11:07   Component 05/29/20  12/05/19 03/02/18  Hemoglobin A1C 6.7 High  6.8 High  6.1 High   Average Blood Glucose (Calc) 146 148 128  ECG: Date: 04/04/2018 Rate: 52 bpm Rhythm: sinus bradycardia Axis (leads I and aVF): Normal Intervals: PR 150 ms. QRS 94 ms. QTc 409 ms. ST segment and T wave changes: No evidence of acute ST segment elevation or depression Comparison: No previous tracings available for review and comparison.  NOTE: Tracing obtained at [facility]; unable for review. Above based on cardiologist's interpretation.    IMAGING / PROCEDURES: CT LUMBAR SPINE WITHOUT CONTRAST performed on 08/23/2020 Acute L2 superior endplate fracture with 29% vertebral body height loss.  No appreciable bony retropulsion. Multilevel degenerative change with likely severe canal stenosis at L3-L4, at least moderate canal stenosis at L4-L5, and potentially severe left foraminal stenosis at L4-L5.  An MRI could better characterize the canal/cord/foramina if clinically indicated Grade 1 anterolisthesis of L5 on S1, most likely degenerative given severe facet arthropathy at this level Diffuse osteopenia  LEXISCAN performed on 07/23/2018 LVEF 58% Regional wall motion reveals normal myocardial thickening and wall motion No artifacts noted Left ventricular cavity size normal No evidence of stress-induced myocardial ischemia or arrhythmia The overall quality of the study is good  TRANSTHORACIC ECHOCARDIOGRAM performed on 09/22/2017 1.  Normal left ventricular systolic function with estimated EF of 55% 2.  Mild aortic valve calcification 3.  Mild mitral valve regurgitation  Impression and Plan:  Jean Stout has been referred for pre-anesthesia review and clearance prior to her undergoing the planned anesthetic and procedural  courses. Available labs, pertinent testing, and imaging results were personally reviewed by me. This patient has been appropriately cleared by cardiology with an overall ACCEPTABLE risk of significant perioperative cardiovascular complications.  Patient will require a repeat EKG prior to surgery.  Due to the timing of her procedure, patient unable to come into PAT office prior to her surgery for preoperative ECG.  This will require patient to have ECG performed in SDS prior to her procedure.  Based on clinical review performed today (09/07/20), barring any significant acute changes in the patient's overall condition, it is anticipated that she will be able to proceed with the planned surgical intervention. Any acute changes in clinical condition may necessitate her procedure being postponed and/or cancelled. Patient will meet with anesthesia team (MD and/or CRNA) on the day of her procedure for preoperative evaluation/assessment. Questions regarding anesthetic course will be fielded at that time.   Pre-surgical instructions were reviewed with the patient during her PAT appointment and questions were fielded by PAT clinical staff. Patient was advised that if any questions or concerns arise prior to her procedure then she should return a call to PAT and/or her surgeon's office to discuss.  Honor Loh, MSN, APRN, FNP-C, CEN [facility]  Peri-operative Services Nurse Practitioner Phone: [phone] 09/07/20 1:47 PM  NOTE: This note has been prepared using Dragon dictation software. Despite my best ability to proofread, there is always the potential that unintentional transcriptional errors may still occur from this process.

## 2020-09-07 ENCOUNTER — Other Ambulatory Visit: Payer: Self-pay | Admitting: Orthopedic Surgery

## 2020-09-07 ENCOUNTER — Other Ambulatory Visit: Payer: Self-pay

## 2020-09-07 ENCOUNTER — Other Ambulatory Visit
Admission: RE | Admit: 2020-09-07 | Discharge: 2020-09-07 | Disposition: A | Discharge status: Home / Self Care | Payer: Medicare Other | Source: Ambulatory Visit | Attending: Orthopedic Surgery | Admitting: Orthopedic Surgery

## 2020-09-07 HISTORY — DX: Cardiac murmur, unspecified: R01.1

## 2020-09-07 HISTORY — DX: Other specified disorders of bone density and structure, unspecified site: M85.80

## 2020-09-07 HISTORY — DX: Nonrheumatic mitral (valve) insufficiency: I34.0

## 2020-09-07 HISTORY — DX: Other specified postprocedural states: Z98.890

## 2020-09-07 HISTORY — DX: Nonrheumatic aortic (valve) stenosis: I35.0

## 2020-09-07 HISTORY — DX: Cystocele, unspecified: N81.10

## 2020-09-07 HISTORY — DX: Obstructive sleep apnea (adult) (pediatric): G47.33

## 2020-09-07 HISTORY — DX: Chronic kidney disease, stage 3 unspecified: N18.30

## 2020-09-07 HISTORY — DX: Anxiety disorder, unspecified: F41.9

## 2020-09-07 HISTORY — DX: Iron deficiency anemia, unspecified: D50.9

## 2020-09-07 HISTORY — DX: Type 2 diabetes mellitus without complications: E11.9

## 2020-09-07 HISTORY — DX: Depression, unspecified: F32.A

## 2020-09-07 HISTORY — DX: Nausea with vomiting, unspecified: R11.2

## 2020-09-07 NOTE — Patient Instructions (Signed)
Your procedure is scheduled on: Tuesday September 08, 2020. Report to Day Surgery inside [facility] 2nd floor (stop by admissions desk first before getting on elevator) To find out your arrival time please call [phone] between 1PM - 3PM on Monday September 07, 2020.  Remember: Instructions that are not followed completely may result in serious medical risk,  up to and including death, or upon the discretion of your surgeon and anesthesiologist your  surgery may need to be rescheduled.     _X__ 1. Do not eat food after midnight the night before your procedure.                 No chewing gum or hard candies. You may drink clear liquids up to 2 hours                 before you are scheduled to arrive for your surgery- DO not drink clear                 liquids within 2 hours of the start of your surgery.                 Clear Liquids include:  water, G2 or                  Gatorade Zero (avoid Red/Purple/Blue), Black Coffee or Tea (Do not add                 anything to coffee or tea).  __X__2.  On the morning of surgery brush your teeth with toothpaste and water, you                may rinse your mouth with mouthwash if you wish.  Do not swallow any toothpaste of mouthwash.     _X__ 3.  No Alcohol for 24 hours before or after surgery.   _X__ 4.  Do Not Smoke or use e-cigarettes For 24 Hours Prior to Your Surgery.                 Do not use any chewable tobacco products for at least 6 hours prior to                 Surgery.  _X__  5.  Do not use any recreational drugs (marijuana, cocaine, heroin, ecstasy, MDMA or other)                For at least one week prior to your surgery.  Combination of these drugs with anesthesia                May have life threatening results.  __X__6.  Notify your doctor if there is any change in your medical condition      (cold, fever, infections).     Do not wear jewelry, make-up, hairpins, clips or nail polish. Do not wear lotions,  powders, or perfumes. You may wear deodorant. Do not shave 48 hours prior to surgery.  Do not bring valuables to the hospital.    [facility] is not responsible for any belongings or valuables.  Contacts, dentures or bridgework may not be worn into surgery. Leave your suitcase in the car. After surgery it may be brought to your room. For patients admitted to the hospital, discharge time is determined by your treatment team.   Patients discharged the day of surgery will not be allowed to drive home.   Make arrangements for someone to be with you for  the first 24 hours of your Same Day Discharge.   __X__ Take these medicines the morning of surgery with A SIP OF WATER:    1. amLODipine (NORVASC) 10 MG  2. HYDROcodone-acetaminophen (NORCO/VICODIN) 5-325 MG  3. omeprazole (PRILOSEC) 20 MG  4. hydrALAZINE (APRESOLINE) 50 MG   5.  6.  ____ Fleet Enema (as directed)   __X__ Use Antibacterial Soap as directed  ____ Use Benzoyl Peroxide Gel as instructed  ____ Use inhalers on the day of surgery  __X__ Stop metformin Today and don't take it tomorrow prior to surgery.    ____ Take 1/2 of usual insulin dose the night before surgery. No insulin the morning          of surgery.   __X__ Stop aspirin today and don't take it the day of surgery.  __X__ One Week prior to surgery- Stop Anti-inflammatories such as Ibuprofen, Aleve, Advil, Motrin, meloxicam (MOBIC), diclofenac, etodolac, ketorolac, Toradol, Daypro, piroxicam, Goody's or BC powders. OK TO USE TYLENOL IF NEEDED   __X__ Stop supplements until after surgery.    ____ Bring C-Pap to the hospital.    If you have any questions regarding your pre-procedure instructions,  Please call Pre-admit Testing at [phone].

## 2020-09-08 ENCOUNTER — Ambulatory Visit
Admission: RE | Admit: 2020-09-08 | Discharge: 2020-09-08 | Disposition: A | Discharge status: Home / Self Care | Payer: Medicare Other | Attending: Orthopedic Surgery | Admitting: Orthopedic Surgery

## 2020-09-08 ENCOUNTER — Other Ambulatory Visit: Payer: Self-pay

## 2020-09-08 ENCOUNTER — Ambulatory Visit: Payer: Medicare Other

## 2020-09-08 ENCOUNTER — Ambulatory Visit: Payer: Medicare Other | Admitting: Urgent Care

## 2020-09-08 ENCOUNTER — Encounter: Payer: Self-pay | Admitting: Orthopedic Surgery

## 2020-09-08 ENCOUNTER — Encounter
Admission: RE | Disposition: A | Discharge status: Home / Self Care | Payer: Self-pay | Source: Home / Self Care | Attending: Orthopedic Surgery

## 2020-09-08 DIAGNOSIS — Z888 Allergy status to other drugs, medicaments and biological substances status: Secondary | ICD-10-CM | POA: Diagnosis not present

## 2020-09-08 DIAGNOSIS — Y9222 Religious institution as the place of occurrence of the external cause: Secondary | ICD-10-CM | POA: Insufficient documentation

## 2020-09-08 DIAGNOSIS — Z91041 Radiographic dye allergy status: Secondary | ICD-10-CM | POA: Diagnosis not present

## 2020-09-08 DIAGNOSIS — Z7982 Long term (current) use of aspirin: Secondary | ICD-10-CM | POA: Insufficient documentation

## 2020-09-08 DIAGNOSIS — W19XXXA Unspecified fall, initial encounter: Secondary | ICD-10-CM | POA: Diagnosis not present

## 2020-09-08 DIAGNOSIS — Z886 Allergy status to analgesic agent status: Secondary | ICD-10-CM | POA: Insufficient documentation

## 2020-09-08 DIAGNOSIS — Z955 Presence of coronary angioplasty implant and graft: Secondary | ICD-10-CM | POA: Insufficient documentation

## 2020-09-08 DIAGNOSIS — M810 Age-related osteoporosis without current pathological fracture: Secondary | ICD-10-CM | POA: Diagnosis not present

## 2020-09-08 DIAGNOSIS — M19011 Primary osteoarthritis, right shoulder: Secondary | ICD-10-CM

## 2020-09-08 DIAGNOSIS — Z79899 Other long term (current) drug therapy: Secondary | ICD-10-CM | POA: Insufficient documentation

## 2020-09-08 DIAGNOSIS — M19012 Primary osteoarthritis, left shoulder: Secondary | ICD-10-CM

## 2020-09-08 DIAGNOSIS — Z419 Encounter for procedure for purposes other than remedying health state, unspecified: Secondary | ICD-10-CM

## 2020-09-08 DIAGNOSIS — Z79891 Long term (current) use of opiate analgesic: Secondary | ICD-10-CM | POA: Insufficient documentation

## 2020-09-08 DIAGNOSIS — Z96643 Presence of artificial hip joint, bilateral: Secondary | ICD-10-CM | POA: Diagnosis not present

## 2020-09-08 DIAGNOSIS — Z7984 Long term (current) use of oral hypoglycemic drugs: Secondary | ICD-10-CM | POA: Insufficient documentation

## 2020-09-08 DIAGNOSIS — M170 Bilateral primary osteoarthritis of knee: Secondary | ICD-10-CM

## 2020-09-08 DIAGNOSIS — S32020A Wedge compression fracture of second lumbar vertebra, initial encounter for closed fracture: Secondary | ICD-10-CM | POA: Insufficient documentation

## 2020-09-08 DIAGNOSIS — G894 Chronic pain syndrome: Secondary | ICD-10-CM

## 2020-09-08 HISTORY — PX: KYPHOPLASTY: SHX5884

## 2020-09-08 HISTORY — DX: Acute myocardial infarction, unspecified: I21.9

## 2020-09-08 LAB — GLUCOSE, CAPILLARY
Glucose-Capillary: 91 mg/dL (ref 70–99)
Glucose-Capillary: 95 mg/dL (ref 70–99)

## 2020-09-08 SURGERY — KYPHOPLASTY
Anesthesia: General

## 2020-09-08 MED ORDER — BUPIVACAINE-EPINEPHRINE (PF) 0.5% -1:200000 IJ SOLN
INTRAMUSCULAR | Status: AC
  Filled 2020-09-08: qty 30

## 2020-09-08 MED ORDER — DEXAMETHASONE SODIUM PHOSPHATE 10 MG/ML IJ SOLN
INTRAMUSCULAR | Status: DC | PRN
  Administered 2020-09-08: 5 mg via INTRAVENOUS

## 2020-09-08 MED ORDER — OXYCODONE HCL 5 MG PO TABS
5.0000 mg | ORAL_TABLET | Freq: Once | ORAL | Status: AC | PRN
  Administered 2020-09-08: 5 mg via ORAL

## 2020-09-08 MED ORDER — OXYCODONE HCL 5 MG PO TABS
ORAL_TABLET | ORAL | Status: AC
  Filled 2020-09-08: qty 1

## 2020-09-08 MED ORDER — FENTANYL CITRATE (PF) 100 MCG/2ML IJ SOLN: 25.0000 ug | INTRAMUSCULAR | Status: DC | PRN

## 2020-09-08 MED ORDER — HYDROCODONE-ACETAMINOPHEN 5-325 MG PO TABS: 1.0000 | ORAL_TABLET | Freq: Four times a day (QID) | ORAL | 0 refills | Status: DC | PRN

## 2020-09-08 MED ORDER — APREPITANT 40 MG PO CAPS: 40.0000 mg | ORAL_CAPSULE | Freq: Once | ORAL | Status: AC

## 2020-09-08 MED ORDER — CEFAZOLIN SODIUM-DEXTROSE 2-4 GM/100ML-% IV SOLN
2.0000 g | INTRAVENOUS | Status: AC
  Administered 2020-09-08: 2 g via INTRAVENOUS

## 2020-09-08 MED ORDER — TRAMADOL HCL 50 MG PO TABS: 50.0000 mg | ORAL_TABLET | ORAL | Status: DC

## 2020-09-08 MED ORDER — APREPITANT 40 MG PO CAPS
ORAL_CAPSULE | ORAL | Status: AC
  Administered 2020-09-08: 40 mg via ORAL
  Filled 2020-09-08: qty 1

## 2020-09-08 MED ORDER — CHLORHEXIDINE GLUCONATE 0.12 % MT SOLN: 15.0000 mL | Freq: Once | OROMUCOSAL | Status: AC

## 2020-09-08 MED ORDER — SODIUM CHLORIDE 0.9 % IV SOLN: INTRAVENOUS | Status: DC

## 2020-09-08 MED ORDER — PROPOFOL 10 MG/ML IV BOLUS
INTRAVENOUS | Status: DC | PRN
  Administered 2020-09-08: 20 mg via INTRAVENOUS
  Administered 2020-09-08: 40 mg via INTRAVENOUS
  Administered 2020-09-08 (×2): 20 mg via INTRAVENOUS

## 2020-09-08 MED ORDER — ACETAMINOPHEN 10 MG/ML IV SOLN
INTRAVENOUS | Status: DC | PRN
  Administered 2020-09-08: 1000 mg via INTRAVENOUS

## 2020-09-08 MED ORDER — LIDOCAINE HCL 1 % IJ SOLN
INTRAMUSCULAR | Status: DC | PRN
  Administered 2020-09-08: 25 mL

## 2020-09-08 MED ORDER — LIDOCAINE HCL (PF) 1 % IJ SOLN
INTRAMUSCULAR | Status: AC
  Filled 2020-09-08: qty 30

## 2020-09-08 MED ORDER — CEFAZOLIN SODIUM-DEXTROSE 2-4 GM/100ML-% IV SOLN
INTRAVENOUS | Status: AC
  Filled 2020-09-08: qty 100

## 2020-09-08 MED ORDER — EPHEDRINE SULFATE 50 MG/ML IJ SOLN
INTRAMUSCULAR | Status: DC | PRN
  Administered 2020-09-08: 5 mg via INTRAVENOUS

## 2020-09-08 MED ORDER — ORAL CARE MOUTH RINSE: 15.0000 mL | Freq: Once | OROMUCOSAL | Status: AC

## 2020-09-08 MED ORDER — IOHEXOL 180 MG/ML  SOLN
INTRAMUSCULAR | Status: DC | PRN
  Administered 2020-09-08: 10 mL

## 2020-09-08 MED ORDER — OXYCODONE HCL 5 MG/5ML PO SOLN: 5.0000 mg | Freq: Once | ORAL | Status: AC | PRN

## 2020-09-08 MED ORDER — ACETAMINOPHEN 10 MG/ML IV SOLN
INTRAVENOUS | Status: AC
  Filled 2020-09-08: qty 100

## 2020-09-08 MED ORDER — ONDANSETRON HCL 4 MG/2ML IJ SOLN
INTRAMUSCULAR | Status: DC | PRN
  Administered 2020-09-08: 4 mg via INTRAVENOUS

## 2020-09-08 MED ORDER — GLYCOPYRROLATE 0.2 MG/ML IJ SOLN
INTRAMUSCULAR | Status: DC | PRN
  Administered 2020-09-08: .2 mg via INTRAVENOUS

## 2020-09-08 MED ORDER — PROPOFOL 500 MG/50ML IV EMUL
INTRAVENOUS | Status: DC | PRN
  Administered 2020-09-08: 125 ug/kg/min via INTRAVENOUS

## 2020-09-08 MED ORDER — CHLORHEXIDINE GLUCONATE 0.12 % MT SOLN
OROMUCOSAL | Status: AC
  Administered 2020-09-08: 15 mL via OROMUCOSAL
  Filled 2020-09-08: qty 15

## 2020-09-08 MED ORDER — BUPIVACAINE-EPINEPHRINE (PF) 0.5% -1:200000 IJ SOLN
INTRAMUSCULAR | Status: DC | PRN
  Administered 2020-09-08: 15 mL

## 2020-09-08 MED ORDER — DULOXETINE HCL 60 MG PO CPEP: 120.0000 mg | ORAL_CAPSULE | Freq: Every day | ORAL | Status: DC

## 2020-09-08 SURGICAL SUPPLY — 22 items
ADH SKN CLS APL DERMABOND .7 (GAUZE/BANDAGES/DRESSINGS) ×1
CEMENT KYPHON CX01A KIT/MIXER (Cement) ×2 IMPLANT
COVER WAND RF STERILE (DRAPES) ×2 IMPLANT
DERMABOND ADVANCED (GAUZE/BANDAGES/DRESSINGS) ×1
DERMABOND ADVANCED .7 DNX12 (GAUZE/BANDAGES/DRESSINGS) ×1 IMPLANT
DEVICE BIOPSY BONE KYPHX (INSTRUMENTS) ×2 IMPLANT
DRAPE C-ARM XRAY 36X54 (DRAPES) ×2 IMPLANT
DURAPREP 26ML APPLICATOR (WOUND CARE) ×2 IMPLANT
FEE RENTAL RFA GENERATOR (MISCELLANEOUS) IMPLANT
GLOVE SURG SYN 9.0  PF PI (GLOVE) ×1
GLOVE SURG SYN 9.0 PF PI (GLOVE) ×1 IMPLANT
GOWN SRG 2XL LVL 4 RGLN SLV (GOWNS) ×1 IMPLANT
GOWN STRL NON-REIN 2XL LVL4 (GOWNS) ×2
GOWN STRL REUS W/ TWL LRG LVL3 (GOWN DISPOSABLE) ×1 IMPLANT
GOWN STRL REUS W/TWL LRG LVL3 (GOWN DISPOSABLE) ×2
MANIFOLD NEPTUNE II (INSTRUMENTS) ×2 IMPLANT
PACK KYPHOPLASTY (MISCELLANEOUS) ×2 IMPLANT
RENTAL RFA GENERATOR (MISCELLANEOUS) IMPLANT
STRAP SAFETY 5IN WIDE (MISCELLANEOUS) ×2 IMPLANT
SWABSTK COMLB BENZOIN TINCTURE (MISCELLANEOUS) ×2 IMPLANT
TRAY KYPHOPAK 15/3 EXPRESS 1ST (MISCELLANEOUS) ×1 IMPLANT
TRAY KYPHOPAK 20/3 EXPRESS 1ST (MISCELLANEOUS) ×2 IMPLANT

## 2020-09-08 NOTE — Op Note (Signed)
09/08/2020  5:26 PM  PATIENT:  Jean Stout   MRN: [id number]   PRE-OPERATIVE DIAGNOSIS:  closed wedge compression fracture of L2   POST-OPERATIVE DIAGNOSIS:  closed wedge compression fracture of L2   PROCEDURE:  Procedure(s): KYPHOPLASTY L2  SURGEON: Laurene Footman, MD   ASSISTANTS: None   ANESTHESIA:   local and MAC   EBL:  No intake/output data recorded.   BLOOD ADMINISTERED:none   DRAINS: none    LOCAL MEDICATIONS USED:  MARCAINE    and XYLOCAINE    SPECIMEN: L2 vertebral body biopsy   DISPOSITION OF SPECIMEN: Pathology   COUNTS:  YES   TOURNIQUET:  * No tourniquets in log *   IMPLANTS: Bone cement   DICTATION: .Dragon Dictation  patient was brought to the operating room and after adequate anesthesia was obtained the patient was placed prone.  C arm was brought in in good visualization of the affected level obtained on both AP and lateral projections.  After patient identification and timeout procedures were completed, local anesthetic was infiltrated with 10 cc 1% Xylocaine infiltrated subcutaneously.  This is done the area on the each side of the planned approach.  The back was then prepped and draped in the usual sterile manner and repeat timeout procedure carried out.  A spinal needle was brought down to the pedicle on the each side of L2 and a 50-50 mix of 1% Xylocaine half percent Sensorcaine with epinephrine total of 20 cc injected on each side.  After allowing this to set a small incision was made and the trocar was advanced into the vertebral body in an extrapedicular fashion.  Biopsy was obtained drilling was carried out balloon inserted with inflation to 3-1/2 cc on the right and across the midline so left-sided stick was not required when the cement was appropriate consistency 5 cc were injected on the right  into the vertebral body without extravasation, good fill superior to inferior endplates and from right to left sides along the inferior endplate.  After  the cement had set the trochar was removed and permanent C-arm views obtained.  The wound was closed with Dermabond followed by Band-Aid   PLAN OF CARE: Discharge home after recovery room   PATIENT DISPOSITION:  PACU - hemodynamically stable.

## 2020-09-08 NOTE — Anesthesia Preprocedure Evaluation (Signed)
Anesthesia Evaluation  Patient identified by MRN, date of birth, ID band Patient awake    Reviewed: Allergy & Precautions, NPO status , Patient's Chart, lab work & pertinent test results  History of Anesthesia Complications (+) PONV and history of anesthetic complications  Airway Mallampati: III  TM Distance: <3 FB Neck ROM: full    Dental  (+) Chipped, Poor Dentition, Missing   Pulmonary sleep apnea ,    Pulmonary exam normal        Cardiovascular Exercise Tolerance: Good hypertension, + CAD, + Past MI and + Cardiac Stents  Normal cardiovascular exam+ Valvular Problems/Murmurs AS      Neuro/Psych PSYCHIATRIC DISORDERS negative neurological ROS     GI/Hepatic Neg liver ROS, GERD  Medicated and Controlled,  Endo/Other  diabetes, Type 2, Oral Hypoglycemic Agents  Renal/GU CRFRenal disease  negative genitourinary   Musculoskeletal  (+) Arthritis ,   Abdominal   Peds  Hematology negative hematology ROS (+)   Anesthesia Other Findings Past Medical History: No date: Anxiety and depression No date: Aortic stenosis No date: Arthritis No date: Cardiac murmur     Comment:  GRADE I/VI medium pitched mid systolic blowing at lower               LSB No date: CKD (chronic kidney disease), stage III (HCC) No date: Coronary artery disease No date: GERD (gastroesophageal reflux disease) 2010: History of MI (myocardial infarction) No date: HLD (hyperlipidemia) No date: HTN (hypertension) No date: IDA (iron deficiency anemia) No date: Mild mitral regurgitation No date: Myocardial infarction (HCC) No date: OSA on CPAP No date: Osteopenia No date: PONV (postoperative nausea and vomiting) No date: T2DM (type 2 diabetes mellitus) (HCC) No date: Vaginal prolapse  Past Surgical History: No date: CAROTID STENT 1983: CARPAL TUNNEL RELEASE; Left 2010: CORONARY ANGIOPLASTY WITH STENT PLACEMENT; N/A     Comment:  stents x 2  placed (unknown type and location); Location:              [facility] 1914: CYST EXCISION     Comment:  from Left side of neck No date: EYE SURGERY 2007: JOINT REPLACEMENT; Bilateral  BMI    Body Mass Index: 32.90 kg/m      Reproductive/Obstetrics negative OB ROS                             Anesthesia Physical Anesthesia Plan  ASA: 3  Anesthesia Plan: General   Post-op Pain Management:    Induction: Intravenous  PONV Risk Score and Plan: Propofol infusion and TIVA  Airway Management Planned: Natural Airway and Nasal Cannula  Additional Equipment:   Intra-op Plan:   Post-operative Plan:   Informed Consent: I have reviewed the patients History and Physical, chart, labs and discussed the procedure including the risks, benefits and alternatives for the proposed anesthesia with the patient or authorized representative who has indicated his/her understanding and acceptance.     Dental Advisory Given  Plan Discussed with: Anesthesiologist, CRNA and Surgeon  Anesthesia Plan Comments: (Patient consented for risks of anesthesia including but not limited to:  - adverse reactions to medications - risk of airway placement if required - damage to eyes, teeth, lips or other oral mucosa - nerve damage due to positioning  - sore throat or hoarseness - Damage to heart, brain, nerves, lungs, other parts of body or loss of life  Patient voiced understanding.)  Anesthesia Quick Evaluation  

## 2020-09-08 NOTE — Anesthesia Procedure Notes (Signed)
Procedure Name: General with mask airway Date/Time: 09/08/2020 5:08 PM Performed by: Mohammed Kindle, CRNA Pre-anesthesia Checklist: Patient identified, Emergency Drugs available, Suction available and Patient being monitored Patient Re-evaluated:Patient Re-evaluated prior to induction Oxygen Delivery Method: Simple face mask Induction Type: IV induction Placement Confirmation: positive ETCO2 and CO2 detector Dental Injury: Teeth and Oropharynx as per pre-operative assessment

## 2020-09-08 NOTE — H&P (Signed)
Chief Complaint  Patient presents with   Lower Back Pain  Larey Seat on 08/16/2020   Jean Stout is a 80 y.o. female who presents today for evaluation of acute low back pain after a fall on 08/16/2020. The patient was at church when she fell landing on her right side. After the initial fall the patient began to experience acute low back discomfort without radiation to bilateral lower extremities. The pain continued, the patient did eventually undergo a CT scan which demonstrated a acute L2 compression fracture. The patient is evaluated by the pain clinic for bilateral knee pain. She denies any previous history of low back pain. The patient denies any numbness or tingling rating down bilateral lower extremities. The patient reports an aching, throbbing discomfort especially across her lumbar spine. She does have ongoing bilateral lower extremity swelling and redness which she is taking Keflex for at this time. The patient was evaluated walk-in clinic for this condition and was instructed to follow-up with cardiology if she continues to experience bilateral lower extremity swelling.  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  Right - January 2007; Left - March 2007   Past Family History: Family History  Problem Relation Age of Onset   High blood pressure (Hypertension) Mother   Anxiety Mother   Myocardial Infarction (Heart attack) Father   Sudden cardiac death Father 73   Hyperlipidemia (Elevated cholesterol) Sister   High blood pressure (Hypertension) Sister    Myocardial Infarction (Heart attack) Sister   Myocardial Infarction (Heart attack) Brother   High blood pressure (Hypertension) Brother   Sudden cardiac death Brother 106   Breast cancer Brother   Testicular cancer Brother   High blood pressure (Hypertension) Sister   Anxiety Sister   Breast cancer Daughter   Myocardial Infarction (Heart attack) Son   Hyperlipidemia (Elevated cholesterol) Son   Coronary Artery Disease (Blocked arteries around heart) Son   Hearing loss Brother   Atrial fibrillation (Abnormal heart rhythm sometimes requiring treatment with blood thinners) Brother   High blood pressure (Hypertension) Brother   Hyperlipidemia (Elevated cholesterol) Brother   Sleep apnea Son   Hyperlipidemia (Elevated cholesterol) Son   Medications: Current Outpatient Medications Ordered in Epic  Medication Sig Dispense Refill   amLODIPine (NORVASC) 5 MG tablet Take 1 tablet (5 mg total) by mouth once daily 90 tablet 1   aspirin 81 MG EC tablet Take 81 mg by mouth once daily   atorvastatin (LIPITOR) 40 MG tablet Take 1 tablet (40 mg total) by mouth once daily 90 tablet 1   baclofen (LIORESAL) 5 mg tablet Take 1 tablet (5 mg total) by mouth 2 (two) times daily for 14 days 28 tablet 0   calcitonin, salmon, (MIACALCIN) 200 unit/actuation nasal spray Place 1 spray into the left nostril once daily for 14 days 3.7 mL 0   cephalexin (KEFLEX) 500 MG capsule TAKE 1 CAPSULE BY MOUTH 4 TIMES DAILY FOR 7 DAYS.   cyclobenzaprine (FLEXERIL) 5 MG tablet Take 5 mg by mouth 2 (two) times daily as needed for Muscle spasms  diclofenac (VOLTAREN) 1 % topical gel Apply 2 g topically 3 (three) times daily   DULoxetine (CYMBALTA) 60 MG DR capsule Take 2 capsules (120 mg total) by mouth once daily 60 capsule 2   famotidine (PEPCID) 40 MG tablet TAKE 1 TABLET BY MOUTH EVERY DAY AT NIGHT 90 tablet 1   glucosamine su 2KCl-chondroit 500-400 mg Tab Take by mouth   hydrALAZINE (APRESOLINE) 50 MG tablet Take 1 tablet  (50 mg total) by mouth 3 (three) times daily 90 tablet 5   hydroCHLOROthiazide (HYDRODIURIL) 12.5 MG tablet Take 1 tablet (12.5 mg total) by mouth once daily 90 tablet 1   lisinopriL (ZESTRIL) 40 MG tablet TAKE 1 TABLET BY MOUTH EVERY DAY 90 tablet 1   metFORMIN (GLUCOPHAGE-XR) 500 MG XR tablet Take 1 tablet (500 mg total) by mouth once daily 90 tablet 1   multivitamin tablet Take 1 tablet by mouth once daily   omeprazole (PRILOSEC) 20 MG DR capsule Take 1 capsule (20 mg total) by mouth once daily 90 capsule 1   traMADoL (ULTRAM) 50 mg tablet Take 100 mg by mouth 3 (three) times daily   doxycycline (VIBRAMYCIN) 100 MG capsule Take 1 capsule (100 mg total) by mouth 2 (two) times daily for 10 days (Patient not taking: Reported on 08/27/2020) 20 capsule 0   HYDROcodone-acetaminophen (NORCO) 5-325 mg tablet Take 1 tablet by mouth every 8 (eight) hours as needed for Pain 15 tablet 0   metaxalone (SKELAXIN) 800 mg tablet Take 0.5 tablets (400 mg total) by mouth 3 (three) times daily as needed for Pain (for pain or spasm) for up to 30 doses (Patient not taking: No sig reported) 15 tablet 0   No current Epic-ordered facility-administered medications on file.   Allergies: Allergies  Allergen Reactions   Iodinated Contrast Media Anaphylaxis   Bextra [Valdecoxib] Rash   Lotrel [Amlodipine-Benazepril] Rash    Review of Systems:  A comprehensive 14 point ROS was performed, reviewed by me today, and the pertinent orthopaedic findings are documented in the HPI.  Exam: BP 118/70  Ht 157.5 cm (5\' 2" )  Wt 81.6 kg (180 lb)  LMP (LMP Unknown)  BMI 32.92 kg/m  General/Constitutional: The patient appears to be well-nourished, well-developed, and in no acute distress. Neuro/Psych: Normal mood and affect, oriented to person, place and time. Eyes: Non-icteric. Pupils are equal, round, and reactive to light, and exhibit synchronous movement. ENT: Unremarkable. Lymphatic: No palpable adenopathy. Respiratory:  Lungs clear to auscultation, Normal chest excursion, No wheezes and Non-labored breathing Cardiovascular: Regular rate and rhythm. No murmurs. and No edema, swelling or tenderness, except as noted in detailed exam. Integumentary: No impressive skin lesions present, except as noted in detailed exam. Musculoskeletal: Unremarkable, except as noted in detailed exam.  General: Well developed, well nourished 80 y.o. female in no apparent distress. Normal affect. Normal communication. Patient answers questions appropriately. Patient presents today in a wheelchair.  Cranial Nerves: Pupils equal round and reactive to light. Facial tone is symmetric. Facial sensation is symmetric. Shoulder shrug is symmetric. Tongue protrusion is midline. There is no pronator drift.  ROM of spine: Increased pain with attempted gentle lumbar flexion and extension exercises. She is tender palpation directly over the L2 vertebral body.  Strength: Side Biceps Triceps Deltoid Interossei Grip Wrist Ext. Wrist Flex.  R 5 5 5 5 5 5 5   L 5 5 5 5 5 5 5    Side Iliopsoas Quads Hamstring PF DF EHL  R 5 5 5 5 5  5  L 5 5 5 5 5 5    Reflexes are 2+ and symmetric at the patella and achilles. Bilateral lower extremity sensation is intact to light touch. Clonus is not present. Toes are down-going.   Negative bilateral Straight leg raise.  Rapid alternating movements are normal.   Skin examination of bilateral lower extremities does reveal some mild erythema indicative of underlying cellulitis versus dermatitis from underlying lymphedema.  Imaging: CT LUMBAR SPINE WITHOUT CONTRAST   TECHNIQUE:  Multidetector CT imaging of the lumbar spine was performed without  intravenous contrast administration. Multiplanar CT image  reconstructions were also generated.   COMPARISON:  None.   FINDINGS:  Segmentation: 5 non rib-bearing lumbar type vertebral bodies.   Alignment: Grade 1 anterolisthesis of L5 on S1, most likely   degenerative given severe facet arthropathy at this level.   Vertebrae: Acute L2 compression fracture with 20% height loss  anteriorly. There is a lucent fracture line through the  anterior/superior cortex with slight cortical offset. Slight  superior endplate trabecular sclerosis. No appreciable bony  retropulsion or evidence of involvement of the posterior elements.  Otherwise, vertebral body heights are maintained. Diffuse  osteopenia.   Paraspinal and other soft tissues: Aorto bi-iliac calcific  atherosclerosis. No acute findings.   Disc levels: Multilevel degenerative change with potentially severe  canal stenosis at L3-L4 and and least moderate canal stenosis at  L4-L5 with subarticular recess narrowing. Severe lower lumbar facet  hypertrophy with potentially severe foraminal stenosis on the left  at L4-L5.   IMPRESSION:  1. Acute L2 superior endplate fracture with 20% vertebral body  height loss. No appreciable bony retropulsion.  2. 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 An MRI could better  characterize the canal/cord/foramina if clinically indicated.  3. Grade 1 anterolisthesis of L5 on S1, most likely degenerative  given severe facet arthropathy at this level.  4. Diffuse osteopenia.   Electronically Signed    By: MD    On: 08/23/2020 14:15  Impression: Closed compression fracture of L2 vertebra, initial encounter (CMS-HCC) [S32.020A] Closed compression fracture of L2 vertebra, initial encounter (CMS-HCC) (primary encounter diagnosis)  Plan:  1. Treatment options were discussed today with the patient. 2. The patient does have a acute L2 compression fracture, she is tender to palpation directly over the vertebral body. 3. Did discuss the possibility of a L2 kyphoplasty. Discussed the risk and benefits of  surgery with her at today's visit. 4. She would like to hold from surgery at this time, she was given a short supply of Norco. The patient does take tramadol through her pain management clinic. 5. She was instructed to contact the clinic in 2 weeks to discuss ongoing back pain and possible kyphoplasty at this time. 6. The patient will follow-up on as-needed basis at this time. They can call the clinic they have any questions, new symptoms develop or symptoms worsen.  This office visit took 45 minutes, of which >50% involved patient counseling/education.  Review of the [place] CSRS was performed in accordance of the [facility] prior to dispensing any controlled drugs.  This note was generated in part with voice recognition software and I apologize for any typographical errors that were not detected and corrected.  08/25/2020, PA-C [facility]  Electronically signed by [facility], [facility] at 08/28/2020 1:19 PM EDT  Reviewed  H+P. No changes  noted.

## 2020-09-08 NOTE — Discharge Instructions (Addendum)
Take it easy today and tomorrow then try to walk is much as you can starting Thursday Band-Aids come off on Thursday then okay to shower Pain medicine as directed Continue all your normal Medications Call office if having problems  AMBULATORY SURGERY  DISCHARGE INSTRUCTIONS   The drugs that you were given will stay in your system until tomorrow so for the next 24 hours you should not:  Drive an automobile Make any legal decisions Drink any alcoholic beverage   You may resume regular meals tomorrow.  Today it is better to start with liquids and gradually work up to solid foods.  You may eat anything you prefer, but it is better to start with liquids, then soup and crackers, and gradually work up to solid foods.   Please notify your doctor immediately if you have any unusual bleeding, trouble breathing, redness and pain at the surgery site, drainage, fever, or pain not relieved by medication.    Additional Instructions:     Please contact your physician with any problems or Same Day Surgery at [phone], Monday through Friday 6 am to 4 pm, or [facility] number at [phone].  

## 2020-09-08 NOTE — Transfer of Care (Signed)
Immediate Anesthesia Transfer of Care Note  Patient: Jean Stout  Procedure(s) Performed: L2  KYPHOPLASTY  Patient Location: PACU  Anesthesia Type:General  Level of Consciousness: awake, alert  and patient cooperative  Airway & Oxygen Therapy: Patient Spontanous Breathing  Post-op Assessment: Report given to RN and Post -op Vital signs reviewed and stable  Post vital signs: Reviewed and stable  Last Vitals:  Vitals Value Taken Time  BP 148/86 09/08/20 1728  Temp    Pulse 72 09/08/20 1730  Resp 19 09/08/20 1730  SpO2 100 % 09/08/20 1730  Vitals shown include unvalidated device data.  Last Pain:  Vitals:   09/08/20 1422  TempSrc: Tympanic  PainSc: 2       Patients Stated Pain Goal: 0 (09/08/20 1422)  Complications: No notable events documented.

## 2020-09-09 ENCOUNTER — Encounter: Payer: Self-pay | Admitting: Orthopedic Surgery

## 2020-09-09 NOTE — Anesthesia Postprocedure Evaluation (Signed)
Anesthesia Post Note  Patient: Jean Stout  Procedure(s) Performed: L2  KYPHOPLASTY  Patient location during evaluation: PACU Anesthesia Type: General Level of consciousness: awake and alert Pain management: pain level controlled Vital Signs Assessment: post-procedure vital signs reviewed and stable Respiratory status: spontaneous breathing, nonlabored ventilation, respiratory function stable and patient connected to nasal cannula oxygen Cardiovascular status: blood pressure returned to baseline and stable Postop Assessment: no apparent nausea or vomiting Anesthetic complications: no   No notable events documented.   Last Vitals:  Vitals:   09/08/20 1730 09/08/20 1745  BP: (!) 161/68 (!) 160/74  Pulse: 72 68  Resp: 19 14  Temp:    SpO2: 100% 98%    Last Pain:  Vitals:   09/08/20 1747  TempSrc:   PainSc: 5                  Lenard Simmer

## 2020-09-10 LAB — SURGICAL PATHOLOGY

## 2020-09-15 ENCOUNTER — Other Ambulatory Visit: Payer: Self-pay

## 2020-09-15 ENCOUNTER — Encounter: Payer: Self-pay | Admitting: Student in an Organized Health Care Education/Training Program

## 2020-09-15 ENCOUNTER — Ambulatory Visit
Payer: Medicare Other | Attending: Student in an Organized Health Care Education/Training Program | Admitting: Student in an Organized Health Care Education/Training Program

## 2020-09-15 ENCOUNTER — Telehealth: Payer: Self-pay

## 2020-09-15 VITALS — BP 179/61 | HR 65 | Temp 96.9°F | Resp 18 | Ht 62.0 in | Wt 180.0 lb

## 2020-09-15 DIAGNOSIS — M19011 Primary osteoarthritis, right shoulder: Secondary | ICD-10-CM | POA: Insufficient documentation

## 2020-09-15 DIAGNOSIS — M25571 Pain in right ankle and joints of right foot: Secondary | ICD-10-CM

## 2020-09-15 DIAGNOSIS — G894 Chronic pain syndrome: Secondary | ICD-10-CM | POA: Diagnosis present

## 2020-09-15 DIAGNOSIS — M170 Bilateral primary osteoarthritis of knee: Secondary | ICD-10-CM | POA: Insufficient documentation

## 2020-09-15 DIAGNOSIS — M19012 Primary osteoarthritis, left shoulder: Secondary | ICD-10-CM

## 2020-09-15 DIAGNOSIS — F331 Major depressive disorder, recurrent, moderate: Secondary | ICD-10-CM | POA: Insufficient documentation

## 2020-09-15 DIAGNOSIS — M1712 Unilateral primary osteoarthritis, left knee: Secondary | ICD-10-CM

## 2020-09-15 DIAGNOSIS — G8929 Other chronic pain: Secondary | ICD-10-CM | POA: Insufficient documentation

## 2020-09-15 NOTE — Progress Notes (Signed)
PROVIDER NOTE: Information contained herein reflects review and annotations entered in association with encounter. Interpretation of such information and data should be left to medically-trained personnel. Information provided to patient can be located elsewhere in the medical record under "Patient Instructions". Document created using STT-dictation technology, any transcriptional errors that may result from process are unintentional.    Patient: Jean Stout  Service Category: E/M  Provider: Edward JollyBilal Naveen Clardy, MD  DOB: 1940-05-16  DOS: 09/15/2020  Specialty: Interventional Pain Management  MRN: [id number]  Setting: Ambulatory outpatient  PCP: Enid BaasKalisetti, Radhika, MD  Type: Established Patient    Referring Provider: Carren Rangarter, Danielle, PA-C  Location: Office  Delivery: Face-to-face     HPI  Jean Stout, a 80 y.o. year old female, is here today because of her Bilateral primary osteoarthritis of knee [M17.0]. Ms. Colclough's primary complain today is Knee Pain (bilateral) and Back Pain Last encounter: My last encounter with her was on 08/05/2020. Pertinent problems: Ms. Ezequiel KayserDrury has Type 2 diabetes mellitus without complication, without long-term current use of insulin (HCC); Osteoarthritis of multiple joints; Primary osteoarthritis of left knee; Localized primary osteoarthritis of shoulder regions, bilateral; Right wrist pain; Chronic pain syndrome; Polyarthralgia; History of bilateral hip replacements; MDD (major depressive disorder), recurrent episode, moderate (HCC); GAD (generalized anxiety disorder); and Panic attacks on their pertinent problem list. Pain Assessment: Severity of Chronic pain is reported as a 5 /10. Location: Knee Right, Left/denies. Onset: More than a month ago. Quality: Aching, Stabbing. Timing: Constant. Modifying factor(s): heat, voltaren gel, medications. Vitals:  height is 5\' 2"  (1.575 m) and weight is 180 lb (81.6 kg). Her temperature is 96.9 F (36.1 C) (abnormal). Her blood pressure  is 179/61 (abnormal) and her pulse is 65. Her respiration is 18 and oxygen saturation is 100%.   Reason for encounter: follow-up evaluation   Patient is status post L2 kyphoplasty for L2 compression fracture with Dr. Rosita KeaMenz on 09/08/2020.  She states that she was unable to get out of bed prior to her surgery.  She is recovering after her kyphoplasty.  She is prescribed hydrocodone by Dr. Rosita KeaMenz which is reasonable for acute postoperative pain.  She still has 1 prescription that she can fill but I told her it was okay even though she has a pain contract with our pain clinic.  She can continue her tramadol as prescribed, she still has 2 refills left.  She will follow-up in 8 weeks for medication management and to review how she was doing and to consider repeat knee steroid injections.  Pharmacotherapy Assessment  Analgesic: Tramadol 50 mg every 4 hours as needed, quantity 150/month maximum 5 tablets a day; MME equals 25   Monitoring: [place] PMP: PDMP reviewed during this encounter.       Pharmacotherapy: No side-effects or adverse reactions reported. Compliance: No problems identified. Effectiveness: Clinically acceptable.  Valerie Saltsice, Kori M, RN  09/15/2020  9:40 AM  Signed Nursing Pain Medication Assessment:  Safety precautions to be maintained throughout the outpatient stay will include: orient to surroundings, keep bed in low position, maintain call bell within reach at all times, provide assistance with transfer out of bed and ambulation.  Medication Inspection Compliance: Pill count conducted under aseptic conditions, in front of the patient. Neither the pills nor the bottle was removed from the patient's sight at any time. Once count was completed pills were immediately returned to the patient in their original bottle.  Medication: Tramadol (Ultram) Pill/Patch Count:  7 of 150 pills remain Pill/Patch Appearance: Markings consistent with  prescribed medication Bottle Appearance: Standard pharmacy  container. Clearly labeled. Filled Date: 05 / 11 / 2022 Last Medication intake:  Today  Hydrocodone 5-325 mg 3/15 Filled 09-02-2020 Due to fractured L2  Has 1 more prescription left to fill   UDS:  Summary  Date Value Ref Range Status  03/19/2020 Note  Final    Comment:    ==================================================================== [employer] Select 13 (MW) ==================================================================== Test                             Result       Flag       Units  Drug Present and Declared for Prescription Verification   Tramadol                       >3497        EXPECTED   ng/mg creat   O-Desmethyltramadol            >3497        EXPECTED   ng/mg creat   N-Desmethyltramadol            >3497        EXPECTED   ng/mg creat    Source of tramadol is a prescription medication. O-desmethyltramadol    and N-desmethyltramadol are expected metabolites of tramadol.  ==================================================================== Test                      Result    Flag   Units      Ref Range   Creatinine              143              mg/dL      >=24 ==================================================================== Declared Medications:  The flagging and interpretation on this report are based on the  following declared medications.  Unexpected results may arise from  inaccuracies in the declared medications.   **Note: The testing scope of this panel includes these medications:   Tramadol (Ultram)   **Note: The testing scope of this panel does not include the  following reported medications:   Amlodipine (Norvasc)  Aspirin  Atorvastatin (Lipitor)  Cetirizine (Zyrtec)  Diclofenac  Docusate (Colace)  Duloxetine (Cymbalta)  Famotidine (Pepcid)  Hydralazine (Apresoline)  Hydrochlorothiazide (Hydrodiuril)  Lisinopril (Zestril)  Metformin (Glucophage)  Omeprazole (Prilosec)  Pantoprazole  (Protonix) ==================================================================== For clinical consultation, please call [phone]. ====================================================================      ROS  Constitutional: Denies any fever or chills Gastrointestinal: No reported hemesis, hematochezia, vomiting, or acute GI distress Musculoskeletal:  Lumbar spine pain Neurological: No reported episodes of acute onset apraxia, aphasia, dysarthria, agnosia, amnesia, paralysis, loss of coordination, or loss of consciousness  Medication Review  Baclofen, Cinnamon, DULoxetine, HYDROcodone-acetaminophen, Magnesium, amLODipine, aspirin EC, atorvastatin, calcitonin (salmon), cephALEXin, cholecalciferol, diclofenac Sodium, famotidine, glucosamine-chondroitin, hydrALAZINE, hydrochlorothiazide, lisinopril, metFORMIN, multivitamin with minerals, omeprazole, and traMADol  History Review  Allergy: Ms. Yontz is allergic to gadolinium derivatives, iodinated diagnostic agents, and bextra [valdecoxib]. Drug: Ms. Cubillos  reports no history of drug use. Alcohol:  reports previous alcohol use. Tobacco:  reports that she has never smoked. She has never used smokeless tobacco. Social: Ms. Wenk  reports that she has never smoked. She has never used smokeless tobacco. She reports previous alcohol use. She reports that she does not use drugs. Medical:  has a past medical history of Anxiety and depression, Aortic stenosis, Arthritis, Cardiac murmur, CKD (chronic  kidney disease), stage III (HCC), Coronary artery disease, GERD (gastroesophageal reflux disease), History of MI (myocardial infarction) (2010), HLD (hyperlipidemia), HTN (hypertension), IDA (iron deficiency anemia), Mild mitral regurgitation, Myocardial infarction (HCC), OSA on CPAP, Osteopenia, PONV (postoperative nausea and vomiting), T2DM (type 2 diabetes mellitus) (HCC), and Vaginal prolapse. Surgical: Ms. Senseney  has a past surgical history that  includes Carotid stent; Joint replacement (Bilateral, 2007); Coronary angioplasty with stent (N/A, 2010); Eye surgery; Carpal tunnel release (Left, 1983); Cyst excision (1984); and Kyphoplasty (N/A, 09/08/2020). Family: family history includes Anxiety disorder in her mother; Bipolar disorder in her sister; Cancer in her daughter; Heart attack in her father and son; Hyperlipidemia in her son; Hypertension in her mother and son; Sleep apnea in her son; Sudden Cardiac Death (age of onset: 66) in her father; Sudden Cardiac Death (age of onset: 39) in her brother.    Recent Imaging Review  DG C-Arm 1-60 Min CLINICAL DATA:  Surgery, elective. Additional history provided: L2 kyphoplasty. Provided fluoroscopy time 1 minutes, 20 seconds.  EXAM: LUMBAR SPINE - 2-3 VIEW; DG C-ARM 1-60 MIN  COMPARISON:  CT of the lumbar spine 08/23/2020.  FINDINGS: AP and lateral view intraprocedural fluoroscopic images of the lumbar spine are submitted, 2 images total. On the provided images, kyphoplasty material is present within the L2 vertebra (at site of a known compression fracture). There may be mild spread of kyphoplasty material posterior to the L2 vertebral body.  IMPRESSION: Two intraprocedural fluoroscopic images of the lumbar spine from L2 kyphoplasty, as described.  Electronically Signed   By: Jackey Loge DO   On: 09/08/2020 18:18 DG Lumbar Spine 2-3 Views CLINICAL DATA:  Surgery, elective. Additional history provided: L2 kyphoplasty. Provided fluoroscopy time 1 minutes, 20 seconds.  EXAM: LUMBAR SPINE - 2-3 VIEW; DG C-ARM 1-60 MIN  COMPARISON:  CT of the lumbar spine 08/23/2020.  FINDINGS: AP and lateral view intraprocedural fluoroscopic images of the lumbar spine are submitted, 2 images total. On the provided images, kyphoplasty material is present within the L2 vertebra (at site of a known compression fracture). There may be mild spread of kyphoplasty material posterior to the L2  vertebral body.  IMPRESSION: Two intraprocedural fluoroscopic images of the lumbar spine from L2 kyphoplasty, as described.  Electronically Signed   By: Jackey Loge DO   On: 09/08/2020 18:18 Note: Reviewed        Physical Exam  General appearance: Well nourished, well developed, and well hydrated. In no apparent acute distress Mental status: Alert, oriented x 3 (person, place, & time)       Respiratory: No evidence of acute respiratory distress Eyes: PERLA Vitals: BP (!) 179/61   Pulse 65   Temp (!) 96.9 F (36.1 C)   Resp 18   Ht 5\' 2"  (1.575 m)   Wt 180 lb (81.6 kg)   SpO2 100%   BMI 32.92 kg/m  BMI: Estimated body mass index is 32.92 kg/m as calculated from the following:   Height as of this encounter: 5\' 2"  (1.575 m).   Weight as of this encounter: 180 lb (81.6 kg). Ideal: Ideal body weight: 50.1 kg (110 lb 7.2 oz) Adjusted ideal body weight: 62.7 kg (138 lb 4.3 oz)  Surgical scar present in upper lumbar spine Slightly tender to touch, pain with lumbar extension Patient ambulating with a cane Lower extremity strength  Assessment   Status Diagnosis  Controlled Controlled Controlled 1. Bilateral primary osteoarthritis of knee   2. Localized primary osteoarthritis of shoulder regions,  bilateral   3. Chronic pain of right ankle   4. MDD (major depressive disorder), recurrent episode, moderate (HCC)   5. Primary osteoarthritis of left knee   6. Chronic pain syndrome       Plan of Care  Follow-up in 8 weeks for medication management  Follow-up plan:   Return in about 8 weeks (around 11/10/2020) for Medication Management, in person.     Left intra-articular knee steroid injection on 05/27/2019, bilateral knee steroid injection 08/21/2019, 11/13/19, 02/05/20.  Has previously had 5 series of viscosupplementation at outside clinic.  States it was not effective.  Status post genicular nerve block which was not effective.  Can consider sprint PNS in future    Recent  Visits Date Type Provider Dept  08/05/20 Procedure visit Edward Jolly, MD [facility]  Showing recent visits within past 90 days and meeting all other requirements Today's Visits Date Type Provider Dept  09/15/20 Office Visit Edward Jolly, MD [facility]  Showing today's visits and meeting all other requirements Future Appointments Date Type Provider Dept  11/05/20 Appointment Edward Jolly, MD [facility]  Showing future appointments within next 90 days and meeting all other requirements I discussed the assessment and treatment plan with the patient. The patient was provided an opportunity to ask questions and all were answered. The patient agreed with the plan and demonstrated an understanding of the instructions.  Patient advised to call back or seek an in-person evaluation if the symptoms or condition worsens.  Duration of encounter: .  Note by: Edward Jolly, MD Date: 09/15/2020; Time: 10:34 AM

## 2020-09-15 NOTE — Patient Instructions (Signed)
Ok to fill Hydrocodone prescribed by Dr [name] for post op pain Continue Tramadol prescribed by Dr Cherylann Ratel, still have 2 refills present at pharmacy Continue with all other medications as prescribed

## 2020-09-15 NOTE — Telephone Encounter (Signed)
PA needed in order for pt to get Hydrocodone prescription

## 2020-09-15 NOTE — Progress Notes (Signed)
Nursing Pain Medication Assessment:  Safety precautions to be maintained throughout the outpatient stay will include: orient to surroundings, keep bed in low position, maintain call bell within reach at all times, provide assistance with transfer out of bed and ambulation.  Medication Inspection Compliance: Pill count conducted under aseptic conditions, in front of the patient. Neither the pills nor the bottle was removed from the patient's sight at any time. Once count was completed pills were immediately returned to the patient in their original bottle.  Medication: Tramadol (Ultram) Pill/Patch Count:  7 of 150 pills remain Pill/Patch Appearance: Markings consistent with prescribed medication Bottle Appearance: Standard pharmacy container. Clearly labeled. Filled Date: 05 / 11 / 2022 Last Medication intake:  Today  Hydrocodone 5-325 mg 3/15 Filled 09-02-2020 Due to fractured L2  Has 1 more prescription left to fill

## 2020-09-15 NOTE — Telephone Encounter (Signed)
Spoke with pharmacy at [facility] to let them know that it is okay to fill hydrocodone apap prescribed by Dr [name].  They will dispense and let patient know.

## 2020-10-06 ENCOUNTER — Telehealth: Payer: Self-pay | Admitting: Student in an Organized Health Care Education/Training Program

## 2020-10-06 NOTE — Telephone Encounter (Signed)
Jean Stout from Dr. [name] office [phone] Lvmail stating the patient had Kypho on L2, June14, she is requesting a refill on her pain med they gave her Hydrocodone, 20 tabs. They wanted to make sure it was ok to refill this once more. Please call Dr. [name] office and speak with Selena Batten. Thank you

## 2020-10-06 NOTE — Telephone Encounter (Signed)
Left message with Selena Batten, informing her that ok for surgeon to treat post op pain as appropriate.

## 2020-10-07 ENCOUNTER — Telehealth: Payer: Self-pay

## 2020-10-07 NOTE — Telephone Encounter (Signed)
Patient notified that a voicemail was left on [facility] voicemail that it was OK for patient to received medication post surgery.

## 2020-10-07 NOTE — Telephone Encounter (Signed)
Pt wants to know if she can get a rx from her surgeon for oxy if is allowed for pain need approval

## 2020-10-12 ENCOUNTER — Encounter (INDEPENDENT_AMBULATORY_CARE_PROVIDER_SITE_OTHER): Payer: Self-pay | Admitting: Nurse Practitioner

## 2020-10-12 ENCOUNTER — Ambulatory Visit (INDEPENDENT_AMBULATORY_CARE_PROVIDER_SITE_OTHER): Payer: Medicare Other | Admitting: Nurse Practitioner

## 2020-10-12 ENCOUNTER — Other Ambulatory Visit: Payer: Self-pay

## 2020-10-12 VITALS — BP 174/79 | HR 61 | Resp 16 | Ht 62.0 in | Wt 186.0 lb

## 2020-10-12 DIAGNOSIS — E119 Type 2 diabetes mellitus without complications: Secondary | ICD-10-CM

## 2020-10-12 DIAGNOSIS — I8311 Varicose veins of right lower extremity with inflammation: Secondary | ICD-10-CM | POA: Diagnosis not present

## 2020-10-12 DIAGNOSIS — I8312 Varicose veins of left lower extremity with inflammation: Secondary | ICD-10-CM

## 2020-10-12 DIAGNOSIS — E782 Mixed hyperlipidemia: Secondary | ICD-10-CM | POA: Diagnosis not present

## 2020-10-12 NOTE — Progress Notes (Signed)
Subjective:    Patient ID: Jean Stout, female    DOB: 1941-02-02, 80 y.o.   MRN: [id number] Chief Complaint  Patient presents with   New Patient (Initial Visit)    Le cellulitis and bilateral VV with edema    Jean Stout is an 80 year old female that presents today for swelling of her lower extremities with multiple episodes of cellulitis.  The patient does note that in most of the cases when she has had cellulitis it has been due to shaving her legs.  The patient denies consistent use of compression mainly due to having some bad knees and discomfort with wearing compression stockings.  She denies any open wounds or ulcerations.  She denies any fevers or chills.  The patient does try to elevate her leg is much as possible and she is typically active.  She has some notable spider varicosities bilaterally.   Review of Systems  Cardiovascular:  Positive for leg swelling.  Musculoskeletal:  Positive for arthralgias.  All other systems reviewed and are negative.     Objective:   Physical Exam Vitals reviewed.  HENT:     Head: Normocephalic.  Cardiovascular:     Rate and Rhythm: Normal rate.     Pulses: Normal pulses.  Pulmonary:     Effort: Pulmonary effort is normal.  Musculoskeletal:     Right lower leg: 2+ Edema present.     Left lower leg: 2+ Edema present.  Skin:    General: Skin is warm and dry.  Neurological:     Mental Status: She is alert and oriented to person, place, and time.  Psychiatric:        Mood and Affect: Mood normal.        Behavior: Behavior normal.        Thought Content: Thought content normal.        Judgment: Judgment normal.    BP (!) 174/79 (BP Location: Right Arm)   Pulse 61   Resp 16   Ht 5\' 2"  (1.575 m)   Wt 186 lb (84.4 kg)   BMI 34.02 kg/m   Past Medical History:  Diagnosis Date   Anxiety and depression    Aortic stenosis    Arthritis    Cardiac murmur    GRADE I/VI medium pitched mid systolic blowing at lower LSB   CKD  (chronic kidney disease), stage III (HCC)    Coronary artery disease    GERD (gastroesophageal reflux disease)    History of MI (myocardial infarction) 2010   HLD (hyperlipidemia)    HTN (hypertension)    IDA (iron deficiency anemia)    Mild mitral regurgitation    Myocardial infarction (HCC)    OSA on CPAP    Osteopenia    PONV (postoperative nausea and vomiting)    T2DM (type 2 diabetes mellitus) (HCC)    Vaginal prolapse     Social History   Socioeconomic History   Marital status: Widowed    Spouse name: Not on file   Number of children: 3   Years of education: Not on file   Highest education level: Bachelor's degree (e.g., BA, AB, BS)  Occupational History   Not on file  Tobacco Use   Smoking status: Never   Smokeless tobacco: Never  Vaping Use   Vaping Use: Never used  Substance and Sexual Activity   Alcohol use: Not Currently   Drug use: Never   Sexual activity: Not Currently  Other Topics Concern   Not on  file  Social History Narrative   Not on file   Social Determinants of Health   Financial Resource Strain: Not on file  Food Insecurity: Not on file  Transportation Needs: Not on file  Physical Activity: Not on file  Stress: Not on file  Social Connections: Not on file  Intimate Partner Violence: Not on file    Past Surgical History:  Procedure Laterality Date   CAROTID STENT     CARPAL TUNNEL RELEASE Left 1983   CORONARY ANGIOPLASTY WITH STENT PLACEMENT N/A 2010   stents x 2 placed (unknown type and location); Location: [facility]   CYST EXCISION  1984   from Left side of neck   EYE SURGERY     JOINT REPLACEMENT Bilateral 2007   KYPHOPLASTY N/A 09/08/2020   Procedure: L2  KYPHOPLASTY;  Surgeon: Kennedy Bucker, MD;  Location: [facility];  Service: Orthopedics;  Laterality: N/A;    Family History  Problem Relation Age of Onset   Hypertension Mother    Anxiety disorder Mother    Heart attack Father    Sudden Cardiac Death Father 82    Bipolar disorder Sister    Sudden Cardiac Death Brother 78   Cancer Daughter        Breast   Hypertension Son    Hyperlipidemia Son    Heart attack Son    Sleep apnea Son     Allergies  Allergen Reactions   Gadolinium Derivatives Rash   Iodinated Diagnostic Agents Itching   Bextra [Valdecoxib] Rash    No flowsheet data found.    CMP  No results found for: NA, K, CL, CO2, GLUCOSE, BUN, CREATININE, CALCIUM, PROT, ALBUMIN, AST, ALT, ALKPHOS, BILITOT, GFRNONAA, GFRAA   No results found.     Assessment & Plan:   1. Varicose veins of both lower extremities with inflammation I have had a long discussion with the patient regarding swelling and why it  causes symptoms.  Patient will begin wearing graduated compression stockings class 1 (20-30 mmHg) on a daily basis a prescription was given. The patient will  beginning wearing the stockings first thing in the morning and removing them in the evening. The patient is instructed specifically not to sleep in the stockings.   In addition, behavioral modification will be initiated.  This will include frequent elevation, use of over the counter pain medications and exercise such as walking.  I have reviewed systemic causes for chronic edema such as liver, kidney and cardiac etiologies.  The patient denies problems with these organ systems.    Consideration for a lymph pump will also be made based upon the effectiveness of conservative therapy.  This would help to improve the edema control and prevent sequela such as ulcers and infections   Patient should undergo duplex ultrasound of the venous system to ensure that DVT or reflux is not present.  The patient will follow-up with me after the ultrasound.   - VAS [place] LOWER EXTREMITY VENOUS REFLUX; Future  2. Hyperlipidemia, mixed Continue statin as ordered and reviewed, no changes at this time   3. Type 2 diabetes mellitus without complication, without long-term current use of insulin  (HCC) Continue hypoglycemic medications as already ordered, these medications have been reviewed and there are no changes at this time.  Hgb A1C to be monitored as already arranged by primary service    Current Outpatient Medications on File Prior to Visit  Medication Sig Dispense Refill   amLODipine (NORVASC) 10 MG tablet Take  5 mg by mouth daily.      aspirin EC 81 MG tablet Take 81 mg by mouth daily.      atorvastatin (LIPITOR) 40 MG tablet Take 40 mg by mouth at bedtime.     Baclofen 5 MG TABS Take 1 tablet by mouth 2 (two) times daily as needed.     calcitonin, salmon, (MIACALCIN/FORTICAL) 200 UNIT/ACT nasal spray Place 1 spray into alternate nostrils daily. For 14 days     cholecalciferol (VITAMIN D3) 25 MCG (1000 UNIT) tablet Take 1,000 Units by mouth daily.     Cinnamon 500 MG capsule Take 500 mg by mouth daily.     diclofenac Sodium (VOLTAREN) 1 % GEL Apply 1 application topically 3 (three) times daily as needed (pain).     DULoxetine (CYMBALTA) 60 MG capsule Take 2 capsules (120 mg total) by mouth daily.     famotidine (PEPCID) 40 MG tablet Take 40 mg by mouth at bedtime.     glucosamine-chondroitin 500-400 MG tablet Take 2 tablets by mouth daily.     hydrALAZINE (APRESOLINE) 50 MG tablet Take 50 mg by mouth 3 (three) times daily.     hydrochlorothiazide (HYDRODIURIL) 12.5 MG tablet Take 12.5 mg by mouth daily.      HYDROcodone-acetaminophen (NORCO/VICODIN) 5-325 MG tablet Take 1 tablet by mouth every 6 (six) hours as needed for moderate pain. 20 tablet 0   lisinopril (PRINIVIL,ZESTRIL) 40 MG tablet Take 40 mg by mouth daily.      Magnesium 500 MG CAPS Take by mouth.     metFORMIN (GLUCOPHAGE-XR) 500 MG 24 hr tablet Take 500 mg by mouth daily with breakfast.      Multiple Vitamins-Minerals (MULTIVITAMIN WITH MINERALS) tablet Take 1 tablet by mouth daily.     omeprazole (PRILOSEC) 20 MG capsule Take 20 mg by mouth daily.     traMADol (ULTRAM) 50 MG tablet Take 1-2 tablets (50-100  mg total) by mouth See admin instructions. For chronic pain syndrome.  Maximum 5 tablets/day.  Each prescription to last 30 days. 100 mg in the morning, 100 mg at 2p, and 50 mg at 8p     No current facility-administered medications on file prior to visit.    There are no Patient Instructions on file for this visit. No follow-ups on file.   Georgiana Spinner, NP

## 2020-10-25 ENCOUNTER — Other Ambulatory Visit: Payer: Self-pay | Admitting: Student in an Organized Health Care Education/Training Program

## 2020-10-27 ENCOUNTER — Ambulatory Visit (INDEPENDENT_AMBULATORY_CARE_PROVIDER_SITE_OTHER): Payer: Medicare Other

## 2020-10-27 ENCOUNTER — Ambulatory Visit (INDEPENDENT_AMBULATORY_CARE_PROVIDER_SITE_OTHER): Payer: Medicare Other | Admitting: Nurse Practitioner

## 2020-10-27 ENCOUNTER — Other Ambulatory Visit: Payer: Self-pay

## 2020-10-27 VITALS — BP 133/70 | HR 66 | Resp 16 | Wt 183.8 lb

## 2020-10-27 DIAGNOSIS — I8311 Varicose veins of right lower extremity with inflammation: Secondary | ICD-10-CM

## 2020-10-27 DIAGNOSIS — E119 Type 2 diabetes mellitus without complications: Secondary | ICD-10-CM | POA: Diagnosis not present

## 2020-10-27 DIAGNOSIS — I8312 Varicose veins of left lower extremity with inflammation: Secondary | ICD-10-CM | POA: Diagnosis not present

## 2020-10-27 DIAGNOSIS — E782 Mixed hyperlipidemia: Secondary | ICD-10-CM

## 2020-10-30 ENCOUNTER — Encounter (INDEPENDENT_AMBULATORY_CARE_PROVIDER_SITE_OTHER): Payer: Medicare Other

## 2020-10-30 ENCOUNTER — Ambulatory Visit (INDEPENDENT_AMBULATORY_CARE_PROVIDER_SITE_OTHER): Payer: Medicare Other | Admitting: Nurse Practitioner

## 2020-11-02 ENCOUNTER — Other Ambulatory Visit: Payer: Self-pay | Admitting: Orthopedic Surgery

## 2020-11-02 DIAGNOSIS — M170 Bilateral primary osteoarthritis of knee: Secondary | ICD-10-CM

## 2020-11-05 ENCOUNTER — Other Ambulatory Visit: Payer: Self-pay

## 2020-11-05 ENCOUNTER — Ambulatory Visit
Payer: Medicare Other | Attending: Student in an Organized Health Care Education/Training Program | Admitting: Student in an Organized Health Care Education/Training Program

## 2020-11-05 ENCOUNTER — Encounter: Payer: Self-pay | Admitting: Student in an Organized Health Care Education/Training Program

## 2020-11-05 ENCOUNTER — Encounter (INDEPENDENT_AMBULATORY_CARE_PROVIDER_SITE_OTHER): Payer: Self-pay | Admitting: Nurse Practitioner

## 2020-11-05 VITALS — BP 144/74 | HR 64 | Temp 97.0°F | Resp 16 | Ht 62.0 in | Wt 179.0 lb

## 2020-11-05 DIAGNOSIS — M25571 Pain in right ankle and joints of right foot: Secondary | ICD-10-CM | POA: Insufficient documentation

## 2020-11-05 DIAGNOSIS — F331 Major depressive disorder, recurrent, moderate: Secondary | ICD-10-CM | POA: Insufficient documentation

## 2020-11-05 DIAGNOSIS — G894 Chronic pain syndrome: Secondary | ICD-10-CM | POA: Insufficient documentation

## 2020-11-05 DIAGNOSIS — M170 Bilateral primary osteoarthritis of knee: Secondary | ICD-10-CM | POA: Diagnosis not present

## 2020-11-05 DIAGNOSIS — M19011 Primary osteoarthritis, right shoulder: Secondary | ICD-10-CM | POA: Insufficient documentation

## 2020-11-05 DIAGNOSIS — M19012 Primary osteoarthritis, left shoulder: Secondary | ICD-10-CM | POA: Diagnosis present

## 2020-11-05 DIAGNOSIS — G8929 Other chronic pain: Secondary | ICD-10-CM | POA: Diagnosis present

## 2020-11-05 DIAGNOSIS — M1712 Unilateral primary osteoarthritis, left knee: Secondary | ICD-10-CM | POA: Insufficient documentation

## 2020-11-05 MED ORDER — HYDROCODONE-ACETAMINOPHEN 7.5-325 MG PO TABS: 1.0000 | ORAL_TABLET | Freq: Two times a day (BID) | ORAL | 0 refills | Status: AC | PRN

## 2020-11-05 MED ORDER — HYDROCODONE-ACETAMINOPHEN 7.5-325 MG PO TABS: 1.0000 | ORAL_TABLET | Freq: Two times a day (BID) | ORAL | 0 refills | Status: DC | PRN

## 2020-11-05 NOTE — Progress Notes (Signed)
Subjective:    Patient ID: Jean Stout, female    DOB: 09/02/1940, 80 y.o.   MRN: [id number] Chief Complaint  Patient presents with   Follow-up    Ultrasound follow up    Jean Stout is an 80 year old female that presents today for swelling of her lower extremities with multiple episodes of cellulitis.  The patient does note that in most of the cases when she has had cellulitis it has been due to shaving her legs.  The patient denies consistent use of compression mainly due to having some bad knees and discomfort with wearing compression stockings.  She denies any open wounds or ulcerations.  She denies any fevers or chills.  The patient does try to elevate her leg is much as possible and she is typically active.  She has some notable spider varicosities bilaterally.   Today noninvasive studies show no evidence of deep venous insufficiency or superficial venous reflux in the left lower extremity.  No evidence of DVT or superficial thrombophlebitis seen in the left lower extremity.  There is a small fluid pocket in the anterior shin area.   Review of Systems  Cardiovascular:  Positive for leg swelling.  All other systems reviewed and are negative.     Objective:   Physical Exam Vitals reviewed.  HENT:     Head: Normocephalic.  Cardiovascular:     Rate and Rhythm: Normal rate.     Pulses: Normal pulses.  Pulmonary:     Effort: Pulmonary effort is normal.  Skin:    General: Skin is warm and dry.  Neurological:     Mental Status: She is alert and oriented to person, place, and time.  Psychiatric:        Mood and Affect: Mood normal.        Behavior: Behavior normal.        Thought Content: Thought content normal.        Judgment: Judgment normal.    BP 133/70 (BP Location: Right Arm)   Pulse 66   Resp 16   Wt 183 lb 12.8 oz (83.4 kg)   BMI 33.62 kg/m   Past Medical History:  Diagnosis Date   Anxiety and depression    Aortic stenosis    Arthritis    Cardiac  murmur    GRADE I/VI medium pitched mid systolic blowing at lower LSB   CKD (chronic kidney disease), stage III (HCC)    Coronary artery disease    GERD (gastroesophageal reflux disease)    History of MI (myocardial infarction) 2010   HLD (hyperlipidemia)    HTN (hypertension)    IDA (iron deficiency anemia)    Mild mitral regurgitation    Myocardial infarction (HCC)    OSA on CPAP    Osteopenia    PONV (postoperative nausea and vomiting)    T2DM (type 2 diabetes mellitus) (HCC)    Vaginal prolapse     Social History   Socioeconomic History   Marital status: Widowed    Spouse name: Not on file   Number of children: 3   Years of education: Not on file   Highest education level: Bachelor's degree (e.g., BA, AB, BS)  Occupational History   Not on file  Tobacco Use   Smoking status: Never   Smokeless tobacco: Never  Vaping Use   Vaping Use: Never used  Substance and Sexual Activity   Alcohol use: Not Currently   Drug use: Never   Sexual activity: Not Currently  Other  Topics Concern   Not on file  Social History Narrative   Not on file   Social Determinants of Health   Financial Resource Strain: Not on file  Food Insecurity: Not on file  Transportation Needs: Not on file  Physical Activity: Not on file  Stress: Not on file  Social Connections: Not on file  Intimate Partner Violence: Not on file    Past Surgical History:  Procedure Laterality Date   CAROTID STENT     CARPAL TUNNEL RELEASE Left 1983   CORONARY ANGIOPLASTY WITH STENT PLACEMENT N/A 2010   stents x 2 placed (unknown type and location); Location: [facility]   CYST EXCISION  1984   from Left side of neck   EYE SURGERY     JOINT REPLACEMENT Bilateral 2007   KYPHOPLASTY N/A 09/08/2020   Procedure: L2  KYPHOPLASTY;  Surgeon: Kennedy Bucker, MD;  Location: [facility];  Service: Orthopedics;  Laterality: N/A;    Family History  Problem Relation Age of Onset   Hypertension Mother    Anxiety  disorder Mother    Heart attack Father    Sudden Cardiac Death Father 58   Bipolar disorder Sister    Sudden Cardiac Death Brother 31   Cancer Daughter        Breast   Hypertension Son    Hyperlipidemia Son    Heart attack Son    Sleep apnea Son     Allergies  Allergen Reactions   Gadolinium Derivatives Rash   Iodinated Diagnostic Agents Itching   Bextra [Valdecoxib] Rash    No flowsheet data found.    CMP  No results found for: NA, K, CL, CO2, GLUCOSE, BUN, CREATININE, CALCIUM, PROT, ALBUMIN, AST, ALT, ALKPHOS, BILITOT, GFRNONAA, GFRAA   No results found.     Assessment & Plan:   1. Varicose veins of both lower extremities with inflammation I have reviewed my discussion with the patient regarding venous insufficiency and why it causes symptoms. I have discussed with the patient the chronic skin changes that accompany venous insufficiency and the long term sequela such as ulceration. Patient will contnue wearing graduated compression stockings on a daily basis, as this has provided excellent control of his edema. The patient will put the stockings on first thing in the morning and removing them in the evening. The patient is reminded not to sleep in the stockings.  In addition, behavioral modification including elevation during the day will be initiated. Exercise is strongly encouraged.   Given the patient's good control and lack of any problems regarding the venous insufficiency and lymphedema a lymph pump in not need at this time.  The patient will follow up with me PRN should anything change.  The patient voices agreement with this plan.   2. Hyperlipidemia, mixed Continue statin as ordered and reviewed, no changes at this time No surgery or intervention at this point in time.   3. Type 2 diabetes mellitus without complication, without long-term current use of insulin (HCC) Continue hypoglycemic medications as already ordered, these medications have been reviewed and  there are no changes at this time.  Hgb A1C to be monitored as already arranged by primary service    Current Outpatient Medications on File Prior to Visit  Medication Sig Dispense Refill   amLODipine (NORVASC) 10 MG tablet Take 5 mg by mouth daily.      aspirin EC 81 MG tablet Take 81 mg by mouth daily.      atorvastatin (LIPITOR) 40  MG tablet Take 40 mg by mouth at bedtime.     Baclofen 5 MG TABS Take 1 tablet by mouth 2 (two) times daily as needed.     calcitonin, salmon, (MIACALCIN/FORTICAL) 200 UNIT/ACT nasal spray Place 1 spray into alternate nostrils daily. For 14 days     cholecalciferol (VITAMIN D3) 25 MCG (1000 UNIT) tablet Take 1,000 Units by mouth daily.     Cinnamon 500 MG capsule Take 500 mg by mouth daily.     diclofenac Sodium (VOLTAREN) 1 % GEL Apply 1 application topically 3 (three) times daily as needed (pain).     DULoxetine (CYMBALTA) 60 MG capsule Take 2 capsules (120 mg total) by mouth daily.     famotidine (PEPCID) 40 MG tablet Take 40 mg by mouth at bedtime.     glucosamine-chondroitin 500-400 MG tablet Take 2 tablets by mouth daily.     hydrALAZINE (APRESOLINE) 50 MG tablet Take 50 mg by mouth 3 (three) times daily.     hydrochlorothiazide (HYDRODIURIL) 12.5 MG tablet Take 12.5 mg by mouth daily.      HYDROcodone-acetaminophen (NORCO/VICODIN) 5-325 MG tablet Take 1 tablet by mouth every 6 (six) hours as needed for moderate pain. 20 tablet 0   lisinopril (PRINIVIL,ZESTRIL) 40 MG tablet Take 40 mg by mouth daily.      Magnesium 500 MG CAPS Take by mouth.     metFORMIN (GLUCOPHAGE-XR) 500 MG 24 hr tablet Take 500 mg by mouth daily with breakfast.      Multiple Vitamins-Minerals (MULTIVITAMIN WITH MINERALS) tablet Take 1 tablet by mouth daily.     omeprazole (PRILOSEC) 20 MG capsule Take 20 mg by mouth daily.     traMADol (ULTRAM) 50 MG tablet Take 1-2 tablets (50-100 mg total) by mouth See admin instructions. For chronic pain syndrome.  Maximum 5 tablets/day.  Each  prescription to last 30 days. 100 mg in the morning, 100 mg at 2p, and 50 mg at 8p     No current facility-administered medications on file prior to visit.    There are no Patient Instructions on file for this visit. No follow-ups on file.   Georgiana Spinner, NP

## 2020-11-05 NOTE — Progress Notes (Signed)
Nursing Pain Medication Assessment:  Safety precautions to be maintained throughout the outpatient stay will include: orient to surroundings, keep bed in low position, maintain call bell within reach at all times, provide assistance with transfer out of bed and ambulation.  Nursing Pain Medication Assessment:  Safety precautions to be maintained throughout the outpatient stay will include: orient to surroundings, keep bed in low position, maintain call bell within reach at all times, provide assistance with transfer out of bed and ambulation.  Medication Inspection Compliance: Pill count conducted under aseptic conditions, in front of the patient. Neither the pills nor the bottle was removed from the patient's sight at any time. Once count was completed pills were immediately returned to the patient in their original bottle.  Medication #1: Hydrocodone/APAP Pill/Patch Count:  2 of 30 pills remain Pill/Patch Appearance: Markings consistent with prescribed medication Bottle Appearance: Standard pharmacy container. Clearly labeled. Filled Date: 7 / 13 / 2022 Last Medication intake:  Yesterday  Medication #2: Tramadol (Ultram) Pill/Patch Count:  135 of 150 pills remain Pill/Patch Appearance: Markings consistent with prescribed medication Bottle Appearance: Standard pharmacy container. Clearly labeled. Filled Date: 8 / 4 /  22 Last Medication intake:  Today

## 2020-11-05 NOTE — Progress Notes (Signed)
PROVIDER NOTE: Information contained herein reflects review and annotations entered in association with encounter. Interpretation of such information and data should be left to medically-trained personnel. Information provided to patient can be located elsewhere in the medical record under "Patient Instructions". Document created using STT-dictation technology, any transcriptional errors that may result from process are unintentional.    Patient: Jean Stout  Service Category: E/M  Provider: Edward Jolly, MD  DOB: 15-Feb-1941  DOS: 11/05/2020  Specialty: Interventional Pain Management  MRN: [id number]  Setting: Ambulatory outpatient  PCP: Enid Baas, MD  Type: Established Patient    Referring Provider: Carren Rang, PA-C  Location: Office  Delivery: Face-to-face     HPI  Jean Stout, a 80 y.o. year old female, is here today because of her Bilateral primary osteoarthritis of knee [M17.0]. Jean Stout primary complain today is Knee Pain Last encounter: My last encounter with her was on 10/25/2020. Pertinent problems: Jean Stout has Type 2 diabetes mellitus without complication, without long-term current use of insulin (HCC); Osteoarthritis of multiple joints; Primary osteoarthritis of left knee; Localized primary osteoarthritis of shoulder regions, bilateral; Right wrist pain; Chronic pain syndrome; Polyarthralgia; History of bilateral hip replacements; MDD (major depressive disorder), recurrent episode, moderate (HCC); GAD (generalized anxiety disorder); and Panic attacks on their pertinent problem list. Pain Assessment: Severity of Chronic pain is reported as a 7 /10. Location: Knee Left/Denies. Onset: More than a month ago. Quality: Aching, Stabbing. Timing: Constant. Modifying factor(s): heat, meds, voltaren. Vitals:  height is 5\' 2"  (1.575 m) and weight is 179 lb (81.2 kg). Her temporal temperature is 97 F (36.1 C) (abnormal). Her blood pressure is 144/74 (abnormal) and her pulse  is 64. Her respiration is 16 and oxygen saturation is 98%.   Reason for encounter: medication management.    -Presents today for medication management.  Status post lumbar kyphoplasty for lumbar compression fracture.  States that she is recovering from the surgery.  She also had 2 knee injections done in her left knee.  She states that she was prescribed hydrocodone perioperatively and was taking this in addition to her tramadol.  Now that her acute postoperative pain has resolved, I recommend single opioid rather than dual opioid therapy.  Patient states that she finds benefit with hydrocodone more than tramadol.  We will transition her to hydrocodone 7.5 mg twice a day as needed.  She continues to have persistent knee pain although she states that her last knee injections were helpful.  She does apply Voltaren gel to her knee as well as heat.  Pharmacotherapy Assessment  Analgesic: Hydrocodone 7.5 mg twice daily    Monitoring: [place] PMP: PDMP reviewed during this encounter.       Pharmacotherapy: No side-effects or adverse reactions reported. Compliance: No problems identified. Effectiveness: Clinically acceptable.  , RN  11/05/2020  8:26 AM  Sign when Signing Visit Nursing Pain Medication Assessment:  Safety precautions to be maintained throughout the outpatient stay will include: orient to surroundings, keep bed in low position, maintain call bell within reach at all times, provide assistance with transfer out of bed and ambulation.  Nursing Pain Medication Assessment:  Safety precautions to be maintained throughout the outpatient stay will include: orient to surroundings, keep bed in low position, maintain call bell within reach at all times, provide assistance with transfer out of bed and ambulation.  Medication Inspection Compliance: Pill count conducted under aseptic conditions, in front of the patient. Neither the pills nor the bottle was removed from  the patient's sight at  any time. Once count was completed pills were immediately returned to the patient in their original bottle.  Medication #1: Hydrocodone/APAP Pill/Patch Count:  2 of 30 pills remain Pill/Patch Appearance: Markings consistent with prescribed medication Bottle Appearance: Standard pharmacy container. Clearly labeled. Filled Date: 7 / 13 / 2022 Last Medication intake:  Yesterday  Medication #2: Tramadol (Ultram) Pill/Patch Count:  135 of 150 pills remain Pill/Patch Appearance: Markings consistent with prescribed medication Bottle Appearance: Standard pharmacy container. Clearly labeled. Filled Date: 8 / 4 /  22 Last Medication intake:  Today   UDS:  Summary  Date Value Ref Range Status  03/19/2020 Note  Final    Comment:    ==================================================================== [employer] Select 13 (MW) ==================================================================== Test                             Result       Flag       Units  Drug Present and Declared for Prescription Verification   Tramadol                       >3497        EXPECTED   ng/mg creat   O-Desmethyltramadol            >3497        EXPECTED   ng/mg creat   N-Desmethyltramadol            >3497        EXPECTED   ng/mg creat    Source of tramadol is a prescription medication. O-desmethyltramadol    and N-desmethyltramadol are expected metabolites of tramadol.  ==================================================================== Test                      Result    Flag   Units      Ref Range   Creatinine              143              mg/dL      >=11 ==================================================================== Declared Medications:  The flagging and interpretation on this report are based on the  following declared medications.  Unexpected results may arise from  inaccuracies in the declared medications.   **Note: The testing scope of this panel includes these medications:   Tramadol  (Ultram)   **Note: The testing scope of this panel does not include the  following reported medications:   Amlodipine (Norvasc)  Aspirin  Atorvastatin (Lipitor)  Cetirizine (Zyrtec)  Diclofenac  Docusate (Colace)  Duloxetine (Cymbalta)  Famotidine (Pepcid)  Hydralazine (Apresoline)  Hydrochlorothiazide (Hydrodiuril)  Lisinopril (Zestril)  Metformin (Glucophage)  Omeprazole (Prilosec)  Pantoprazole (Protonix) ==================================================================== For clinical consultation, please call [phone]. ====================================================================      ROS  Constitutional: Denies any fever or chills Gastrointestinal: No reported hemesis, hematochezia, vomiting, or acute GI distress Musculoskeletal:  Low back, bilateral knee pain, left greater than right Neurological: No reported episodes of acute onset apraxia, aphasia, dysarthria, agnosia, amnesia, paralysis, loss of coordination, or loss of consciousness  Medication Review  Baclofen, Cinnamon, DULoxetine, HYDROcodone-acetaminophen, Magnesium, amLODipine, aspirin EC, atorvastatin, calcitonin (salmon), cholecalciferol, diclofenac Sodium, famotidine, glucosamine-chondroitin, hydrALAZINE, hydrochlorothiazide, lisinopril, metFORMIN, multivitamin with minerals, omeprazole, and traMADol  History Review  Allergy: Jean Stout is allergic to gadolinium derivatives, iodinated diagnostic agents, and bextra [valdecoxib]. Drug: Jean Stout  reports no history of drug use. Alcohol:  reports  that she does not currently use alcohol. Tobacco:  reports that she has never smoked. She has never used smokeless tobacco. Social: Jean Stout  reports that she has never smoked. She has never used smokeless tobacco. She reports that she does not currently use alcohol. She reports that she does not use drugs. Medical:  has a past medical history of Anxiety and depression, Aortic stenosis, Arthritis, Cardiac  murmur, CKD (chronic kidney disease), stage III (HCC), Coronary artery disease, GERD (gastroesophageal reflux disease), History of MI (myocardial infarction) (2010), HLD (hyperlipidemia), HTN (hypertension), IDA (iron deficiency anemia), Mild mitral regurgitation, Myocardial infarction (HCC), OSA on CPAP, Osteopenia, PONV (postoperative nausea and vomiting), T2DM (type 2 diabetes mellitus) (HCC), and Vaginal prolapse. Surgical: Jean Stout  has a past surgical history that includes Carotid stent; Joint replacement (Bilateral, 2007); Coronary angioplasty with stent (N/A, 2010); Eye surgery; Carpal tunnel release (Left, 1983); Cyst excision (1984); and Kyphoplasty (N/A, 09/08/2020). Family: family history includes Anxiety disorder in her mother; Bipolar disorder in her sister; Cancer in her daughter; Heart attack in her father and son; Hyperlipidemia in her son; Hypertension in her mother and son; Sleep apnea in her son; Sudden Cardiac Death (age of onset: 6851) in her father; Sudden Cardiac Death (age of onset: 8063) in her brother.   Recent Imaging Review  VAS US LOWER EXTREMITY VENOUS REFLUX  Lower Venous Reflux Study  Patient Name:  Jean MinsMARGARET Tiley  Date of Exam:   10/27/2020 Medical Rec #: [id number]       Accession #:    [id number] Date of Birth: 25-Jan-1941      Patient Gender: F Patient Age:   [age]Y Exam Location:  [facility] Procedure:      VAS US LOWER EXTREMITY VENOUS REFLUX Referring Phys: [id number] Erma PintoFALLON E BROWN  --------------------------------------------------------------------------------   Indications: Left ankle swelling, and varicosities.   Risk Factors: Left leg only per patient. Performing Technologist: Salvadore Farbererry Knight RVT    Examination Guidelines: A complete evaluation includes B-mode imaging, spectral Doppler, color Doppler, and power Doppler as needed of all accessible portions of each vessel. Bilateral testing is considered an integral part of a complete examination.  Limited examinations for reoccurring indications may be performed as noted. The reflux portion of the exam is performed with the patient in reverse Trendelenburg. Significant venous reflux is defined as >500 ms in the superficial venous system, and >1 second in the deep venous system.        Summary: Left: - No evidence of deep vein thrombosis seen in the left lower extremity, from the common femoral through the popliteal veins. - No evidence of superficial venous thrombosis in the left lower extremity. - There is no evidence of venous reflux seen in the left lower extremity. - No evidence of superficial venous reflux seen in the left greater saphenous vein. - No evidence of superficial venous reflux seen in the left short saphenous vein. - Small fluid pocket anterior shin/ankle region near bone.   *See table(s) above for measurements and observations.  Electronically signed by Festus BarrenJason Dew MD on 11/03/2020 at 10:45:56 AM.      Final   Note: Reviewed        Physical Exam  General appearance: Well nourished, well developed, and well hydrated. In no apparent acute distress Mental status: Alert, oriented x 3 (person, place, & time)       Respiratory: No evidence of acute respiratory distress Eyes: PERLA Vitals: BP (!) 144/74 (BP Location: Right Arm, Patient Position: Sitting,  Cuff Size: Large)   Pulse 64   Temp (!) 97 F (36.1 C) (Temporal)   Resp 16   Ht 5\' 2"  (1.575 m)   Wt 179 lb (81.2 kg)   SpO2 98%   BMI 32.74 kg/m  BMI: Estimated body mass index is 32.74 kg/m as calculated from the following:   Height as of this encounter: 5\' 2"  (1.575 m).   Weight as of this encounter: 179 lb (81.2 kg). Ideal: Ideal body weight: 50.1 kg (110 lb 7.2 oz) Adjusted ideal body weight: 62.5 kg (137 lb 13.9 oz)  Low back pain, pain with facet loading Bilateral knee pain Left knee edema Varicose veins 5 out of 5 strength bilateral lower extremity: Plantar flexion, dorsiflexion, knee  flexion, knee extension.   Assessment   Status Diagnosis  Controlled Controlled Controlled 1. Bilateral primary osteoarthritis of knee   2. Localized primary osteoarthritis of shoulder regions, bilateral   3. Chronic pain of right ankle   4. MDD (major depressive disorder), recurrent episode, moderate (HCC)   5. Primary osteoarthritis of left knee   6. Chronic pain syndrome       Plan of Care    Ms. Biance Moncrief has a current medication list which includes the following long-term medication(s): amlodipine, atorvastatin, duloxetine, famotidine, hydralazine, hydrochlorothiazide, lisinopril, metformin, omeprazole, and tramadol.  Pharmacotherapy (Medications Ordered): Meds ordered this encounter  Medications   HYDROcodone-acetaminophen (NORCO) 7.5-325 MG tablet    Sig: Take 1 tablet by mouth 2 (two) times daily as needed for severe pain. Must last 30 days    Dispense:  60 tablet    Refill:  0    Chronic Pain: STOP Act (Not applicable) Fill 1 day early if closed on refill date. Avoid benzodiazepines within 8 hours of opioids   HYDROcodone-acetaminophen (NORCO) 7.5-325 MG tablet    Sig: Take 1 tablet by mouth 2 (two) times daily as needed for severe pain. Must last 30 days    Dispense:  60 tablet    Refill:  0    Chronic Pain: STOP Act (Not applicable) Fill 1 day early if closed on refill date. Avoid benzodiazepines within 8 hours of opioids   HYDROcodone-acetaminophen (NORCO) 7.5-325 MG tablet    Sig: Take 1 tablet by mouth 2 (two) times daily as needed for severe pain. Must last 30 days    Dispense:  60 tablet    Refill:  0    Chronic Pain: STOP Act (Not applicable) Fill 1 day early if closed on refill date. Avoid benzodiazepines within 8 hours of opioids   Orders:  No orders of the defined types were placed in this encounter.  Follow-up plan:   Return in about 3 months (around 02/05/2021) for Medication Management, in person.     Left intra-articular knee steroid  injection on 05/27/2019, bilateral knee steroid injection 08/21/2019, 11/13/19, 02/05/20.  Has previously had 5 series of viscosupplementation at outside clinic.  States it was not effective.  Status post genicular nerve block which was not effective.  Can consider sprint PNS in future     Recent Visits Date Type Provider Dept  09/15/20 Office Visit 13/10/21, MD [facility]  Showing recent visits within past 90 days and meeting all other requirements Today's Visits Date Type Provider Dept  11/05/20 Office Visit Edward Jolly, MD [facility]  Showing today's visits and meeting all other requirements Future Appointments No visits were found meeting these conditions. Showing future appointments within next 90 days and meeting  all other requirements I discussed the assessment and treatment plan with the patient. The patient was provided an opportunity to ask questions and all were answered. The patient agreed with the plan and demonstrated an understanding of the instructions.  Patient advised to call back or seek an in-person evaluation if the symptoms or condition worsens.  Duration of encounter: .  Note by: Edward Jolly, MD Date: 11/05/2020; Time: 9:31 AM

## 2020-11-24 ENCOUNTER — Other Ambulatory Visit: Payer: Self-pay

## 2020-11-24 ENCOUNTER — Ambulatory Visit
Admission: RE | Admit: 2020-11-24 | Discharge: 2020-11-24 | Disposition: A | Discharge status: Home / Self Care | Payer: Medicare Other | Source: Ambulatory Visit | Attending: Orthopedic Surgery | Admitting: Orthopedic Surgery

## 2020-11-24 DIAGNOSIS — M170 Bilateral primary osteoarthritis of knee: Secondary | ICD-10-CM | POA: Diagnosis present

## 2020-12-16 ENCOUNTER — Other Ambulatory Visit: Payer: Self-pay | Admitting: Orthopedic Surgery

## 2020-12-22 ENCOUNTER — Encounter
Admission: RE | Admit: 2020-12-22 | Discharge: 2020-12-22 | Disposition: A | Discharge status: Home / Self Care | Payer: Medicare Other | Source: Ambulatory Visit | Attending: Orthopedic Surgery | Admitting: Orthopedic Surgery

## 2020-12-22 ENCOUNTER — Inpatient Hospital Stay: Admission: RE | Admit: 2020-12-22 | Payer: Medicare Other | Source: Ambulatory Visit

## 2020-12-22 ENCOUNTER — Other Ambulatory Visit: Payer: Self-pay

## 2020-12-22 DIAGNOSIS — Z01818 Encounter for other preprocedural examination: Secondary | ICD-10-CM | POA: Diagnosis present

## 2020-12-22 LAB — TYPE AND SCREEN
ABO/RH(D): O POS
Antibody Screen: NEGATIVE

## 2020-12-22 LAB — COMPREHENSIVE METABOLIC PANEL
ALT: 22 U/L (ref 0–44)
AST: 31 U/L (ref 15–41)
Albumin: 4.5 g/dL (ref 3.5–5.0)
Alkaline Phosphatase: 82 U/L (ref 38–126)
Anion gap: 12 (ref 5–15)
BUN: 22 mg/dL (ref 8–23)
CO2: 24 mmol/L (ref 22–32)
Calcium: 9.7 mg/dL (ref 8.9–10.3)
Chloride: 98 mmol/L (ref 98–111)
Creatinine, Ser: 1.54 mg/dL — ABNORMAL HIGH (ref 0.44–1.00)
GFR, Estimated: 34 mL/min — ABNORMAL LOW (ref >60)
Glucose, Bld: 139 mg/dL — ABNORMAL HIGH (ref 70–99)
Potassium: 4.1 mmol/L (ref 3.5–5.1)
Sodium: 134 mmol/L — ABNORMAL LOW (ref 135–145)
Total Bilirubin: 1.2 mg/dL (ref 0.3–1.2)
Total Protein: 7.6 g/dL (ref 6.5–8.1)

## 2020-12-22 LAB — SURGICAL PCR SCREEN
MRSA, PCR: NEGATIVE
Staphylococcus aureus: NEGATIVE

## 2020-12-22 NOTE — Patient Instructions (Addendum)
Your procedure is scheduled AJ:OINOMVEH 12/31/20 Report to the Registration Desk on the 1st floor of the [facility]. To find out your arrival time, please call [phone] between 1PM - 3PM on:Wednesday 12/30/20  REMEMBER: Instructions that are not followed completely may result in serious medical risk, up to and including death; or upon the discretion of your surgeon and anesthesiologist your surgery may need to be rescheduled.  Do not eat food after midnight the night before surgery.  No gum chewing, lozengers or hard candies.  You may however, drink CLEAR water up to 2 hours before you are scheduled to arrive for your surgery. Do not drink anything within 2 hours of your scheduled arrival time.  Type 1 and Type 2 diabetics should only drink water.  Call Dr. Neomia Glass office for when to stop the aspirin  TAKE THESE MEDICATIONS THE MORNING OF SURGERY WITH A SIP OF WATER: HYDROcodone-acetaminophen (NORCO/VICODIN) 5-325 MG tablet  omeprazole (PRILOSEC) 20 MG capsule (take one on the morning of surgery - helps to prevent nausea after surgery.)  Stop Metformin 12/29/20 Take last dose on Monday 12/28/20  One week prior to surgery: Cinnamon 500 MG capsule glucosamine-chondroitin 500-400 MG tablet Magnesium 500 MG CAPS Multiple Vitamins-Minerals (MULTIVITAMIN WITH MINERALS) tablet Turmeric 450 MG CAPS Stop Anti-inflammatories (NSAIDS) such as Advil, Aleve, Ibuprofen, Motrin, Naproxen, Naprosyn and Aspirin based products such as Excedrin, Goodys Powder, BC Powder. Stop ANY OVER THE COUNTER supplements until after surgery. You may however, continue to take Tylenol if needed for pain up until the day of surgery.  No Alcohol for 24 hours before or after surgery.  No Smoking including e-cigarettes for 24 hours prior to surgery.  No chewable tobacco products for at least 6 hours prior to surgery.  No nicotine patches on the day of surgery.  Do not use any "recreational" drugs for at least a  week prior to your surgery.  Please be advised that the combination of cocaine and anesthesia may have negative outcomes, up to and including death. If you test positive for cocaine, your surgery will be cancelled.  On the morning of surgery brush your teeth with toothpaste and water, you may rinse your mouth with mouthwash if you wish. Do not swallow any toothpaste or mouthwash.  Use CHG Soap or wipes as directed on instruction sheet.  Do not wear jewelry, make-up, hairpins, clips or nail polish.  Do not wear lotions, powders, or perfumes.   Do not shave body from the neck down 48 hours prior to surgery just in case you cut yourself which could leave a site for infection.  Also, freshly shaved skin may become irritated if using the CHG soap.  Dentures may not be worn into surgery.  Do not bring valuables to the hospital. [facility] is not responsible for any missing/lost belongings or valuables.   Notify your doctor if there is any change in your medical condition (cold, fever, infection).  Wear comfortable clothing (specific to your surgery type) to the hospital.  After surgery, you can help prevent lung complications by doing breathing exercises.  Take deep breaths and cough every 1-2 hours. Your doctor may order a device called an Incentive Spirometer to help you take deep breaths.  If you are being admitted to the hospital overnight, leave your suitcase in the car. After surgery it may be brought to your room.  If you are being discharged the day of surgery, you will not be allowed to drive home. You will need a  responsible adult (18 years or older) to drive you home and stay with you that night.   IPlease call the Pre-admissions Testing Dept. at [phone] if you have any questions about these instructions.  Surgery Visitation Policy:  Patients undergoing a surgery or procedure may have one family member or support person with them as long as that person is not  COVID-19 positive or experiencing its symptoms.  That person may remain in the waiting area during the procedure and may rotate out with other people.  Inpatient Visitation:    Visiting hours are 7 a.m. to 8 p.m. Up to two visitors ages 16+ are allowed at one time in a patient room. The visitors may rotate out with other people during the day. Visitors must check out when they leave, or other visitors will not be allowed. One designated support person may remain overnight. The visitor must pass COVID-19 screenings, use hand sanitizer when entering and exiting the patient's room and wear a mask at all times, including in the patient's room. Patients must also wear a mask when staff or their visitor are in the room. Masking is required regardless of vaccination status.

## 2020-12-23 ENCOUNTER — Other Ambulatory Visit: Payer: Medicare Other

## 2020-12-24 ENCOUNTER — Encounter: Payer: Self-pay | Admitting: Orthopedic Surgery

## 2020-12-24 NOTE — Progress Notes (Signed)
Perioperative Services  Pre-Admission/Anesthesia Testing Clinical Review  Date: 12/24/20  Patient Demographics:  Name: Jean Stout DOB:   1941-02-03 MRN:   [id number]  Planned Surgical Procedure(s):    Case: [id number] Date/Time: 12/31/20 0700   Procedure: TOTAL KNEE ARTHROPLASTY (Left: Knee)   Anesthesia type: Choice   Pre-op diagnosis: Primary osteoarthritis of left knee M17.12   Location: [facility] OR ROOM 01 / [facility]   Surgeons: Kennedy Bucker, MD     NOTE: Available PAT nursing documentation and vital signs have been reviewed. Clinical nursing staff has updated patient's PMH/PSHx, current medication list, and drug allergies/intolerances to ensure comprehensive history available to assist in medical decision making as it pertains to the aforementioned surgical procedure and anticipated anesthetic course. Extensive review of available clinical information performed. Gordon PMH and PSHx updated with any diagnoses/procedures that  may have been inadvertently omitted during her intake with the pre-admission testing department's nursing staff.  Clinical Discussion:  Jean Stout is a 80 y.o. female who is submitted for pre-surgical anesthesia review and clearance prior to her undergoing the above procedure. Patient has never been a smoker.  Pertinent PMH includes: CAD, MI, aortic stenosis, mild MV regurgitation, cardiac murmur, HTN, HLD, T2DM, CKD-III, OSAH (not complaint with nocturnal PAP therapy), GERD (on daily PPI), OA, osteopenia, anxiety, depression.  Patient is followed by cardiology Lady Gary, MD). She was last seen in the cardiology clinic on 11/25/2020; notes reviewed.  At the time of her clinic visit, patient doing well overall from a cardiovascular perspective.  Patient denied any chest pain, PND, orthopnea, palpitations, significant peripheral edema, vertiginous symptoms, or presyncope/syncope.  Patient with intermittent episodes of shortness of breath and  palpitations. PMH significant for cardiovascular diagnoses.  Information limited as patient has received care in [place], [place], and here in [place].  Information regarding remote cardiac history obtained from local cardiologist's documentation.  Patient suffered a MI in 2010 while living in [place].  She reportedly underwent PCI with stent placement x 2.  Degree of stenosis and location of stent placement unknown.   Patient underwent TTE in 08/2017 that revealed normal left ventricular systolic function with an estimated EF of 55%.  There was mild aortic valve calcification and mild mitral valve regurgitation noted.   Myocardial perfusion imaging study performed on 07/23/2018 revealed an LVEF of 58% with no evidence of stress-induced myocardial ischemia or arrhythmia.  Blood pressure reasonably controlled at 132/76 on currently prescribed CCB, vasodilator, and ACEi therapies.  Patient is on a statin for her HLD.  T2DM well-controlled on her currently prescribed regimen; last Hgb A1c was 6.7% when checked on 05/29/2020. She has a diagnosis of controlled OSAH, however she is noncompliant with her prescribed nocturnal PAP therapy. Functional capacity, as defined by DASI, is documented as being >/= 4 METS.  No changes were made to patient's medication regimen.  Patient to follow-up with outpatient cardiology in 6 months or sooner if needed.  [name] is scheduled for an elective LEFT TOTAL KNEE ARTHROPLASTY on 12/31/2020 with Dr. Kennedy Bucker, MD.  Given patient's past medical history significant for cardiovascular diagnoses, presurgical cardiac clearance was sought by the PAT team. Per cardiology, "this patient is optimized for surgery and may proceed with the planned procedural course with a MODERATE risk of significant perioperative cardiovascular complications". This patient is on daily antiplatelet therapy. She has been instructed on recommendations for holding her daily low-dose  ASA for 5 days prior to her procedure with plans to restart  as soon as postoperative bleeding risk felt to be minimized by her attending surgeon. The patient has been instructed that her last dose of her anticoagulant will be on 12/25/2020.  Patient denies previous perioperative complications with anesthesia in the past. In review of the available records, it is noted that patient underwent a general anesthetic course here (ASA III) in 08/2020 without documented complications.   Vitals with BMI 12/22/2020 11/05/2020 10/27/2020  Height 5\' 2"  5\' 2"  -  Weight 180 lbs 179 lbs 183 lbs 13 oz  BMI 32.91 32.73 33.61  Systolic 129 144 938  Diastolic 71 74 70  Pulse 69 64 66  Some encounter information is confidential and restricted. Go to Review Flowsheets activity to see all data.    Providers/Specialists:   NOTE: Primary physician provider listed below. Patient may have been seen by APP or partner within same practice.   PROVIDER ROLE / SPECIALTY LAST Inge Rise, MD ORTHOPEDICS (SURGEON) 12/23/2020  Enid Baas, MD PRIMARY CARE PROVIDER 10/01/2020  Harold Hedge, MD CARDIOLOGY 11/25/2020  Lamont Dowdy, MD NEPHROLOGY 11/23/2020   Allergies:  Gadolinium derivatives, Iodinated diagnostic agents, and Bextra [valdecoxib]  Current Home Medications:   No current facility-administered medications for this encounter.    amLODipine (NORVASC) 5 MG tablet   aspirin EC 81 MG tablet   atorvastatin (LIPITOR) 40 MG tablet   cholecalciferol (VITAMIN D3) 25 MCG (1000 UNIT) tablet   Cinnamon 500 MG capsule   diclofenac Sodium (VOLTAREN) 1 % GEL   DULoxetine (CYMBALTA) 60 MG capsule   famotidine (PEPCID) 40 MG tablet   glucosamine-chondroitin 500-400 MG tablet   hydrALAZINE (APRESOLINE) 50 MG tablet   HYDROcodone-acetaminophen (NORCO) 7.5-325 MG tablet   lisinopril (PRINIVIL,ZESTRIL) 40 MG tablet   Magnesium 500 MG CAPS   metFORMIN (GLUCOPHAGE-XR) 500 MG 24 hr tablet   Multiple  Vitamins-Minerals (MULTIVITAMIN WITH MINERALS) tablet   omeprazole (PRILOSEC) 20 MG capsule   Turmeric 450 MG CAPS   [START ON 01/04/2021] HYDROcodone-acetaminophen (NORCO) 7.5-325 MG tablet   HYDROcodone-acetaminophen (NORCO/VICODIN) 5-325 MG tablet   History:   Past Medical History:  Diagnosis Date   Anxiety and depression    Aortic stenosis    Arthritis    Cardiac murmur    a.) Grade I/VI medium pitched mid systolic blowing at lower LSB   CKD (chronic kidney disease), stage III (HCC)    Coronary artery disease    GERD (gastroesophageal reflux disease)    History of MI (myocardial infarction) 2010   HLD (hyperlipidemia)    HTN (hypertension)    IDA (iron deficiency anemia)    Mild mitral regurgitation    Myocardial infarction (HCC) 2010   a.) Tx'd at [facility] [place]; PCI with stents x 2 (unknown type and location).   OSA on CPAP    a.) not complaint with prescribed nocturnal PAP therapy   Osteopenia    PONV (postoperative nausea and vomiting)    T2DM (type 2 diabetes mellitus) (HCC)    Vaginal prolapse    Past Surgical History:  Procedure Laterality Date   CAROTID STENT     CARPAL TUNNEL RELEASE Left 1983   CORONARY ANGIOPLASTY WITH STENT PLACEMENT N/A 2010   stents x 2 placed (unknown type and location); Location: [facility] [place]   CYST EXCISION  1984   from Left side of neck   EYE SURGERY     JOINT REPLACEMENT Bilateral 2007   KYPHOPLASTY N/A 09/08/2020   Procedure: L2  KYPHOPLASTY;  Surgeon: Kennedy Bucker,  MD;  Location: [facility] ORS;  Service: Orthopedics;  Laterality: N/A;   Family History  Problem Relation Age of Onset   Hypertension Mother    Anxiety disorder Mother    Heart attack Father    Sudden Cardiac Death Father 2   Bipolar disorder Sister    Sudden Cardiac Death Brother 66   Cancer Daughter        Breast   Hypertension Son    Hyperlipidemia Son    Heart attack Son    Sleep apnea Son    Social History   Tobacco Use   Smoking  status: Never   Smokeless tobacco: Never  Vaping Use   Vaping Use: Never used  Substance Use Topics   Alcohol use: Not Currently   Drug use: Never    Pertinent Clinical Results:  LABS: Labs reviewed: Acceptable for surgery.  Hospital Outpatient Visit on 12/22/2020  Component Date Value Ref Range Status   Sodium 12/22/2020 134 (A) 135 - 145 mmol/L Final   Potassium 12/22/2020 4.1  3.5 - 5.1 mmol/L Final   Chloride 12/22/2020 98  98 - 111 mmol/L Final   CO2 12/22/2020 24  22 - 32 mmol/L Final   Glucose, Bld 12/22/2020 139 (A) 70 - 99 mg/dL Final   Glucose reference range applies only to samples taken after fasting for at least 8 hours.   BUN 12/22/2020 22  8 - 23 mg/dL Final   Creatinine, Ser 12/22/2020 1.54 (A) 0.44 - 1.00 mg/dL Final   Calcium 94/70/9628 9.7  8.9 - 10.3 mg/dL Final   Total Protein 36/62/9476 7.6  6.5 - 8.1 g/dL Final   Albumin 54/65/0354 4.5  3.5 - 5.0 g/dL Final   AST 65/68/1275 31  15 - 41 U/L Final   ALT 12/22/2020 22  0 - 44 U/L Final   Alkaline Phosphatase 12/22/2020 82  38 - 126 U/L Final   Total Bilirubin 12/22/2020 1.2  0.3 - 1.2 mg/dL Final   GFR, Estimated 12/22/2020 34 (A) >60 mL/min Final   Comment: (NOTE) Calculated using the CKD-EPI Creatinine Equation (2021)    Anion gap 12/22/2020 12  5 - 15 Final   Performed at [facility], [address] [place], [place] [address]   ABO/RH(D) 12/22/2020 O POS   Final   Antibody Screen 12/22/2020 NEG   Final   Sample Expiration 12/22/2020 01/05/2021,2359   Final   Extend sample reason 12/22/2020    Final                   Value:NO TRANSFUSIONS OR PREGNANCY IN THE PAST 3 MONTHS Performed at [facility], [address] [place], [place] [address]    MRSA, PCR 12/22/2020 NEGATIVE  NEGATIVE Final   Staphylococcus aureus 12/22/2020 NEGATIVE  NEGATIVE Final   Comment: (NOTE) The Xpert SA Assay (FDA approved for NASAL specimens in patients 8 years of age and older), is one component of a  comprehensive surveillance program. It is not intended to diagnose infection nor to guide or monitor treatment. Performed at [facility], [address] [place], [place] [address]     Ref Range & Units 10/29/2020  WBC 3.8 - 10.8 Thousand/uL 5.9   RBC 3.8 - 5.1 Million/uL 3.68 Low    Hemoglobin 11.7 - 15.5 g/dL 16.3 Low    Hematocrit 35 - 45 % 33.8 Low    MCV 80 - 100 fL 91.8   MCH 27 - 33 pg 29.1   MCHC 32 -  36 g/dL 33.2 Low    RDW 11 - 15 % 12.5   Platelets 140 - 400 Thousand/uL 268   MPV 7.5 - 12.5 fL 9.3   Neutrophils Absolute 1500 - 7800 cells/uL 3,280   Band Neutrophils Absolute, Manual Count 0 - 750 cells/uL CANCELED   Metamyelocytes Absolute 0 cells/uL CANCELED   Absolute Myelocytes 0 cells/uL CANCELED   Absolute Promyelocytes 0 cells/uL CANCELED   Lymphocytes Absolute 850 - 3900 cells/uL 1,876   Monocytes Absolute 200 - 950 cells/uL 531   Eosinophils Absolute 15 - 500 cells/uL 159   Basophils Absolute 0 - 200 cells/uL 53   Blasts Absolute 0 cells/uL CANCELED   NRBC Absolute 0 cells/uL CANCELED   Neutrophils Relative % 55.6   Bands Absolute % CANCELED   Metamyelocytes Percent % CANCELED   Myelocytes Relative % CANCELED   Promyelocytes Relative % CANCELED   Lymphocytes % 31.8   Variant lymphocytes/100 WBC (Bld) 0 - 10 % CANCELED   Monocytes % 9.0   Eosinophils % 2.7   Basophils Relative % 0.9   Blasts % CANCELED   nRBC 0 /100 WBC CANCELED   Comment(s)  CANCELED   Resulting Agency      ECG: Date: 09/08/2020 Time ECG obtained: 1407 PM Rate: 57 bpm Rhythm: sinus bradycardia Axis (leads I and aVF): Normal Intervals: PR 168 ms. QRS 96 ms. QTc 422 ms. ST segment and T wave changes: No evidence of acute ST segment elevation or depression.   Comparison: Similar to previous tracing obtained on 04/04/2018. Evidence of an age undetermined septal infarct present.   IMAGING / PROCEDURES: CT KNEE LEFT WITHOUT CONTRAST performed on  11/24/2020 Tricompartmental osteoarthritis of the LEFT knee most severe in the medial compartment with 5 mm osteochondral defect along the posterior weightbearing medial femoral condyle. Moderate-sized joint effusion  CT LUMBAR SPINE WITHOUT CONTRAST performed on 08/23/2020 Acute L2 superior endplate fracture with 20% vertebral body height loss.  No appreciable bony retropulsion. Multilevel degenerative change with likely severe canal stenosis at L3-L4, at least moderate canal stenosis at L4-L5, and potentially severe left foraminal stenosis at L4-L5.  An MRI could better characterize the canal/cord/foramina if clinically indicated Grade 1 anterolisthesis of L5 on S1, most likely degenerative given severe facet arthropathy at this level Diffuse osteopenia   LEXISCAN performed on 07/23/2018 LVEF 58% Regional wall motion reveals normal myocardial thickening and wall motion No artifacts noted Left ventricular cavity size normal No evidence of stress-induced myocardial ischemia or arrhythmia The overall quality of the study is good   TRANSTHORACIC ECHOCARDIOGRAM performed on 09/22/2017 1.  Normal left ventricular systolic function with estimated EF of 55% 2.  Mild aortic valve calcification 3.  Mild mitral valve regurgitation  Impression and Plan:  Lizette Pazos has been referred for pre-anesthesia review and clearance prior to her undergoing the planned anesthetic and procedural courses. Available labs, pertinent testing, and imaging results were personally reviewed by me. This patient has been appropriately cleared by cardiology with an overall MODERATE risk of significant perioperative cardiovascular complications.  Based on clinical review performed today (12/24/20), barring any significant acute changes in the patient's overall condition, it is anticipated that she will be able to proceed with the planned surgical intervention. Any acute changes in clinical condition may necessitate her  procedure being postponed and/or cancelled. Patient will meet with anesthesia team (MD and/or CRNA) on the day of her procedure for preoperative evaluation/assessment. Questions regarding anesthetic course will be fielded at that time.   Pre-surgical  instructions were reviewed with the patient during her PAT appointment and questions were fielded by PAT clinical staff. Patient was advised that if any questions or concerns arise prior to her procedure then she should return a call to PAT and/or her surgeon's office to discuss.  Quentin Mulling, MSN, APRN, FNP-C, CEN [facility]  Peri-operative Services Nurse Practitioner Phone: [phone] Fax: [phone] 12/24/20 9:19 AM  NOTE: This note has been prepared using Dragon dictation software. Despite my best ability to proofread, there is always the potential that unintentional transcriptional errors may still occur from this process.

## 2020-12-29 ENCOUNTER — Other Ambulatory Visit
Admission: RE | Admit: 2020-12-29 | Discharge: 2020-12-29 | Disposition: A | Discharge status: Home / Self Care | Payer: Medicare Other | Source: Ambulatory Visit | Attending: Orthopedic Surgery | Admitting: Orthopedic Surgery

## 2020-12-29 ENCOUNTER — Other Ambulatory Visit: Payer: Self-pay

## 2020-12-29 DIAGNOSIS — U071 COVID-19: Secondary | ICD-10-CM | POA: Diagnosis not present

## 2020-12-29 DIAGNOSIS — Z01812 Encounter for preprocedural laboratory examination: Secondary | ICD-10-CM | POA: Diagnosis present

## 2020-12-29 DIAGNOSIS — Z8616 Personal history of COVID-19: Secondary | ICD-10-CM

## 2020-12-29 HISTORY — DX: Personal history of COVID-19: Z86.16

## 2020-12-30 ENCOUNTER — Emergency Department
Admission: EM | Admit: 2020-12-30 | Discharge: 2020-12-30 | Disposition: A | Discharge status: Home / Self Care | Payer: Medicare Other | Attending: Emergency Medicine | Admitting: Emergency Medicine

## 2020-12-30 ENCOUNTER — Other Ambulatory Visit
Admission: RE | Admit: 2020-12-30 | Discharge: 2020-12-30 | Disposition: A | Discharge status: Home / Self Care | Payer: Medicare Other | Source: Ambulatory Visit | Attending: Orthopedic Surgery | Admitting: Orthopedic Surgery

## 2020-12-30 ENCOUNTER — Other Ambulatory Visit: Payer: Medicare Other

## 2020-12-30 ENCOUNTER — Encounter: Payer: Self-pay | Admitting: Urgent Care

## 2020-12-30 ENCOUNTER — Telehealth: Payer: Self-pay | Admitting: Urgent Care

## 2020-12-30 ENCOUNTER — Other Ambulatory Visit: Payer: Self-pay

## 2020-12-30 DIAGNOSIS — I2510 Atherosclerotic heart disease of native coronary artery without angina pectoris: Secondary | ICD-10-CM | POA: Diagnosis not present

## 2020-12-30 DIAGNOSIS — U071 COVID-19: Secondary | ICD-10-CM | POA: Diagnosis not present

## 2020-12-30 DIAGNOSIS — E1122 Type 2 diabetes mellitus with diabetic chronic kidney disease: Secondary | ICD-10-CM | POA: Diagnosis not present

## 2020-12-30 DIAGNOSIS — Z955 Presence of coronary angioplasty implant and graft: Secondary | ICD-10-CM | POA: Diagnosis not present

## 2020-12-30 DIAGNOSIS — Z96643 Presence of artificial hip joint, bilateral: Secondary | ICD-10-CM | POA: Insufficient documentation

## 2020-12-30 DIAGNOSIS — D631 Anemia in chronic kidney disease: Secondary | ICD-10-CM | POA: Insufficient documentation

## 2020-12-30 DIAGNOSIS — Z7984 Long term (current) use of oral hypoglycemic drugs: Secondary | ICD-10-CM | POA: Diagnosis not present

## 2020-12-30 DIAGNOSIS — N1831 Chronic kidney disease, stage 3a: Secondary | ICD-10-CM | POA: Diagnosis not present

## 2020-12-30 DIAGNOSIS — Z7982 Long term (current) use of aspirin: Secondary | ICD-10-CM | POA: Insufficient documentation

## 2020-12-30 DIAGNOSIS — Z79899 Other long term (current) drug therapy: Secondary | ICD-10-CM | POA: Diagnosis not present

## 2020-12-30 DIAGNOSIS — I129 Hypertensive chronic kidney disease with stage 1 through stage 4 chronic kidney disease, or unspecified chronic kidney disease: Secondary | ICD-10-CM | POA: Diagnosis not present

## 2020-12-30 DIAGNOSIS — Z8616 Personal history of COVID-19: Secondary | ICD-10-CM | POA: Insufficient documentation

## 2020-12-30 DIAGNOSIS — R059 Cough, unspecified: Secondary | ICD-10-CM | POA: Diagnosis present

## 2020-12-30 LAB — COMPREHENSIVE METABOLIC PANEL
ALT: 20 U/L (ref 0–44)
AST: 24 U/L (ref 15–41)
Albumin: 4.2 g/dL (ref 3.5–5.0)
Alkaline Phosphatase: 77 U/L (ref 38–126)
Anion gap: 7 (ref 5–15)
BUN: 20 mg/dL (ref 8–23)
CO2: 24 mmol/L (ref 22–32)
Calcium: 8.8 mg/dL — ABNORMAL LOW (ref 8.9–10.3)
Chloride: 104 mmol/L (ref 98–111)
Creatinine, Ser: 1.19 mg/dL — ABNORMAL HIGH (ref 0.44–1.00)
GFR, Estimated: 46 mL/min — ABNORMAL LOW (ref >60)
Glucose, Bld: 118 mg/dL — ABNORMAL HIGH (ref 70–99)
Potassium: 3.9 mmol/L (ref 3.5–5.1)
Sodium: 135 mmol/L (ref 135–145)
Total Bilirubin: 0.8 mg/dL (ref 0.3–1.2)
Total Protein: 6.6 g/dL (ref 6.5–8.1)

## 2020-12-30 LAB — SARS CORONAVIRUS 2 (TAT 6-24 HRS): SARS Coronavirus 2: POSITIVE — AB

## 2020-12-30 LAB — SARS CORONAVIRUS 2 BY RT PCR (HOSPITAL ORDER, PERFORMED IN ~~LOC~~ HOSPITAL LAB): SARS Coronavirus 2: POSITIVE — AB

## 2020-12-30 MED ORDER — MOLNUPIRAVIR EUA 200MG CAPSULE: 4.0000 | ORAL_CAPSULE | Freq: Two times a day (BID) | ORAL | 0 refills | Status: AC

## 2020-12-30 NOTE — Progress Notes (Signed)
  Perioperative Services Pre-Admission/Anesthesia Testing    Date: 12/30/20  Name: Yomaris Palecek MRN:   [id number]  Re: Results of SARS-CoV-2 (novel coronavirus) testing and request for retesting  Planned Surgical Procedure(s):   Case: [id number]                                                           Date/Time: 12/31/20 0700  Procedure: TOTAL KNEE ARTHROPLASTY (Left: Knee)  Anesthesia type: Choice  Pre-op diagnosis: Primary osteoarthritis of left knee M17.12  Location: [facility] OR ROOM 01 / [facility]  Surgeons: Kennedy Bucker, MD    Clinical Notes:  Patient scheduled for the above procedure on 12/31/2020 with Dr. Kennedy Bucker.  In preparation for her procedure, patient presented to the PAT clinic on the morning of 12/29/2020 for SARS-CoV-2 testing.  In review of the results of this testing, patient was noted to be positive.  Patient presents back to the clinic today advising that she feels asymptomatic and that the result obtained yesterday was a "false positive".  She is requesting to be retested.  In efforts to determine validity and reliability of previously obtained SARS-CoV-2 result, repeat rapid PCR testing performed today.  Results of repeat PCR testing for SARS-CoV-2 were determined to be POSITIVE.  Pertinent Clinical Results:   Lab Results  Component Value Date   SARSCOV2NAA POSITIVE (A) 12/30/2020   SARSCOV2NAA POSITIVE (A) 12/29/2020   Impression and Plan:  Sundy Houchins has undergone PCR testing for SARS-CoV-2 twice at this point, with each result being positive. I have contacted the patient to make her aware of the results and follow up precautions. I have advised her to contact her PCP to make them aware, as with her PMH they may consider treatment with MAB infusion or oral nirmatrelvir/ritonavir course. I have also reached out to primary surgeon's office to make them aware that care will need to be postponed secondary to the confirmed (+) results from  the patient's SARS-CoV-2 testing today. Copy of this note sent to surgeon as well to make him aware.   Quentin Mulling, MSN, APRN, FNP-C, CEN [facility]  Peri-operative Services Nurse Practitioner Phone: [phone] 12/30/20 1:30 PM  NOTE: This note has been prepared using Dragon dictation software. Despite my best ability to proofread, there is always the potential that unintentional transcriptional errors may still occur from this process.

## 2020-12-30 NOTE — ED Triage Notes (Signed)
See first nurse note. Was getting tested for surgery tomorrow. Sent here for covid medication.

## 2020-12-30 NOTE — ED Triage Notes (Signed)
First Nurse Note:  Arrives from Urgent Care.  COVID +, sent for 'the medicine'.  AAOx3.  Skin warm and dry. No SOB/ DOE.  NAD

## 2020-12-30 NOTE — ED Provider Notes (Signed)
[facility] Emergency Department Provider Note  ____________________________________________  Time seen: Approximately 5:52 PM  I have reviewed the triage vital signs and the nursing notes.   HISTORY  Chief Complaint Medication Refill    HPI Jean Stout is a 80 y.o. female with a past history of CAD CKD and GERD who comes the ED for evaluation due to being diagnosed with COVID.  She denies any acute symptoms except for a very mild dry cough.  She was being tested during a preoperative screening protocol which was positive yesterday.  She had a repeat rapid antigen test this morning which was also positive.  She received 1 COVID-vaccine in April 2021.  She takes care of her twin sister who has mild dementia and also tested positive for COVID today.    Past Medical History:  Diagnosis Date   Anxiety and depression    Aortic stenosis    Arthritis    Cardiac murmur    a.) Grade I/VI medium pitched mid systolic blowing at lower LSB   CKD (chronic kidney disease), stage III (HCC)    Coronary artery disease    GERD (gastroesophageal reflux disease)    History of 2019 novel coronavirus disease (COVID-19) 12/29/2020   a.) aymptomatic; PCR (+) on 12/29/2020 with confirmatory (+) rapid PCR on 12/30/2020.   History of MI (myocardial infarction) 2010   HLD (hyperlipidemia)    HTN (hypertension)    IDA (iron deficiency anemia)    Mild mitral regurgitation    Myocardial infarction [facility]) 2010   a.) Tx'd at [facility]; PCI with stents x 2 (unknown type and location).   OSA on CPAP    a.) not complaint with prescribed nocturnal PAP therapy   Osteopenia    PONV (postoperative nausea and vomiting)    T2DM (type 2 diabetes mellitus) (HCC)    Vaginal prolapse      Patient Active Problem List   Diagnosis Date Noted   Bilateral primary osteoarthritis of knee 05/19/2020   Secondary hyperparathyroidism of renal origin (HCC) 09/11/2019   Anemia in  chronic kidney disease 08/02/2019   Benign hypertensive kidney disease with chronic kidney disease 08/02/2019   Stage 3a chronic kidney disease (HCC) 08/02/2019   MDD (major depressive disorder), recurrent episode, moderate (HCC) 11/02/2018   GAD (generalized anxiety disorder) 11/02/2018   Panic attacks 11/02/2018   Insomnia due to mental condition 11/02/2018   Primary osteoarthritis of left knee 10/09/2018   Localized primary osteoarthritis of shoulder regions, bilateral 10/09/2018   Right wrist pain 10/09/2018   Chronic pain of right ankle 10/09/2018   Chronic pain syndrome 10/09/2018   Polyarthralgia 10/09/2018   History of bilateral hip replacements 10/09/2018   Aortic stenosis, moderate 03/13/2018   Coronary artery disease involving native coronary artery of native heart without angina pectoris 03/01/2018   Depression 03/01/2018   Essential hypertension 03/01/2018   GERD without esophagitis 03/01/2018   Hyperlipidemia, mixed 03/01/2018   Iron deficiency anemia 03/01/2018   OSA on CPAP 03/01/2018   Type 2 diabetes mellitus without complication, without long-term current use of insulin (HCC) 03/01/2018   Osteoarthritis of multiple joints 03/01/2018     Past Surgical History:  Procedure Laterality Date   CAROTID STENT     CARPAL TUNNEL RELEASE Left 1983   CORONARY ANGIOPLASTY WITH STENT PLACEMENT N/A 2010   stents x 2 placed (unknown type and location); Location: [facility]   CYST EXCISION  1984   from Left side of neck  EYE SURGERY     JOINT REPLACEMENT Bilateral 2007   KYPHOPLASTY N/A 09/08/2020   Procedure: L2  KYPHOPLASTY;  Surgeon: Kennedy Bucker, MD;  Location: [facility] ORS;  Service: Orthopedics;  Laterality: N/A;     Prior to Admission medications   Medication Sig Start Date End Date Taking? Authorizing Provider  molnupiravir EUA (LAGEVRIO) 200 mg CAPS capsule Take 4 capsules (800 mg total) by mouth 2 (two) times daily for 5 days. 12/30/20 01/04/21 Yes  Sharman Cheek, MD  amLODipine (NORVASC) 5 MG tablet Take 5 mg by mouth daily. 03/01/18   [provider]  aspirin EC 81 MG tablet Take 81 mg by mouth daily.     [provider]  atorvastatin (LIPITOR) 40 MG tablet Take 40 mg by mouth at bedtime. 03/01/18   [provider]  cholecalciferol (VITAMIN D3) 25 MCG (1000 UNIT) tablet Take 1,000 Units by mouth daily.    [provider]  Cinnamon 500 MG capsule Take 500 mg by mouth daily.    [provider]  diclofenac Sodium (VOLTAREN) 1 % GEL Apply 1 application topically 3 (three) times daily as needed (pain).    [provider]  DULoxetine (CYMBALTA) 60 MG capsule Take 2 capsules (120 mg total) by mouth daily. 09/08/20   Kennedy Bucker, MD  famotidine (PEPCID) 40 MG tablet Take 40 mg by mouth at bedtime. 12/17/19   [provider]  glucosamine-chondroitin 500-400 MG tablet Take 2 tablets by mouth daily.    [provider]  hydrALAZINE (APRESOLINE) 50 MG tablet Take 50 mg by mouth 3 (three) times daily. 04/04/18   [provider]  HYDROcodone-acetaminophen (NORCO) 7.5-325 MG tablet Take 1 tablet by mouth 2 (two) times daily as needed for severe pain. Must last 30 days 12/05/20 01/04/21  Edward Jolly, MD  HYDROcodone-acetaminophen (NORCO) 7.5-325 MG tablet Take 1 tablet by mouth 2 (two) times daily as needed for severe pain. Must last 30 days 01/04/21 02/03/21  Edward Jolly, MD  HYDROcodone-acetaminophen (NORCO/VICODIN) 5-325 MG tablet Take 1 tablet by mouth every 6 (six) hours as needed for moderate pain. Patient not taking: Reported on 12/16/2020 09/08/20   Kennedy Bucker, MD  lisinopril (PRINIVIL,ZESTRIL) 40 MG tablet Take 40 mg by mouth daily.  03/01/18   [provider]  Magnesium 500 MG CAPS Take 500 mg by mouth daily.    [provider]  metFORMIN (GLUCOPHAGE-XR) 500 MG 24 hr tablet Take 500 mg by mouth daily with breakfast.  03/01/18   [provider]  Multiple Vitamins-Minerals (MULTIVITAMIN WITH MINERALS) tablet Take 1 tablet by mouth daily.    [provider]  omeprazole (PRILOSEC) 20 MG capsule Take 20 mg by mouth daily. 12/19/19   [provider]  Turmeric 450 MG CAPS Take 450 mg by mouth daily.    [provider]     Allergies Gadolinium derivatives, Iodinated diagnostic agents, and Bextra [valdecoxib]   Family History  Problem Relation Age of Onset   Hypertension Mother    Anxiety disorder Mother    Heart attack Father    Sudden Cardiac Death Father 77   Bipolar disorder Sister    Sudden Cardiac Death Brother 73   Cancer Daughter        Breast   Hypertension Son    Hyperlipidemia Son    Heart attack Son    Sleep apnea Son     Social History Social History   Tobacco Use   Smoking status: Never   Smokeless  tobacco: Never  Vaping Use   Vaping Use: Never used  Substance Use Topics   Alcohol use: Not Currently   Drug use: Never    Review of Systems  Constitutional:   No fever or chills.  ENT:   No sore throat. No rhinorrhea. Cardiovascular:   No chest pain or syncope. Respiratory:   No dyspnea positive nonproductive cough. Gastrointestinal:   Negative for abdominal pain, vomiting and diarrhea.  Musculoskeletal:   Negative for focal pain or swelling All other systems reviewed and are negative except as documented above in ROS and HPI.  ____________________________________________   PHYSICAL EXAM:  VITAL SIGNS: ED Triage Vitals  Enc Vitals Group     BP 12/30/20 1525 113/88     Pulse Rate 12/30/20 1525 82     Resp 12/30/20 1525 18     Temp 12/30/20 1525 99.4 F (37.4 C)     Temp Source 12/30/20 1525 Oral     SpO2 12/30/20 1525 97 %     Weight 12/30/20 1518 178 lb 9.2 oz (81 kg)     Height 12/30/20 1518 5\' 2"  (1.575 m)     Head Circumference --      Peak Flow --      Pain Score 12/30/20 1518 0     Pain Loc --      Pain Edu? --      Excl. in GC? --     Vital signs  reviewed, nursing assessments reviewed.   Constitutional:   Alert and oriented. Non-toxic appearance. Eyes:   Conjunctivae are normal. EOMI. ENT      Head:   Normocephalic and atraumatic.            Neck:   No meningismus. Full ROM. Cardiovascular:   RRR.  Respiratory:   Normal respiratory effort without tachypnea/retractions. Musculoskeletal:   Normal range of motion in all extremities.  . Neurologic:   Normal speech and language.  Motor grossly intact. No acute focal neurologic deficits are appreciated.   ____________________________________________    LABS (pertinent positives/negatives) (all labs ordered are listed, but only abnormal results are displayed) Labs Reviewed  COMPREHENSIVE METABOLIC PANEL - Abnormal; Notable for the following components:      Result Value   Glucose, Bld 118 (*)    Creatinine, Ser 1.19 (*)    Calcium 8.8 (*)    GFR, Estimated 46 (*)    All other components within normal limits   ____________________________________________   EKG  ____________________________________________    RADIOLOGY  No results found.  ____________________________________________   PROCEDURES Procedures  ____________________________________________  CLINICAL IMPRESSION / ASSESSMENT AND PLAN / ED COURSE  Pertinent labs & imaging results that were available during my care of the patient were reviewed by me and considered in my medical decision making (see chart for details).  Jean Stout was evaluated in Emergency Department on 12/30/2020 for the symptoms described in the history of present illness. She was evaluated in the context of the global COVID-19 pandemic, which necessitated consideration that the patient might be at risk for infection with the SARS-CoV-2 virus that causes COVID-19. Institutional protocols and algorithms that pertain to the evaluation of patients at risk for COVID-19 are in a state of rapid change based on information released by  regulatory bodies including the CDC and federal and state organizations. These policies and algorithms were followed during the patient's care in the ED.   Patient comes the ED with her twin sister for evaluation due to COVID infection.  She reports that her PCP had plan to send a prescription for molnupiravir which I will prescribe.  Patient denies any symptoms, normal work of breathing, normal vital signs, stable for discharge      ____________________________________________   FINAL CLINICAL IMPRESSION(S) / ED DIAGNOSES    Final diagnoses:  COVID-19 virus infection     ED Discharge Orders          Ordered    molnupiravir EUA (LAGEVRIO) 200 mg CAPS capsule  2 times daily        12/30/20 1751            Portions of this note were generated with dragon dictation software. Dictation errors may occur despite best attempts at proofreading.   Sharman Cheek, MD 12/30/20 1755

## 2021-01-12 ENCOUNTER — Inpatient Hospital Stay
Admission: RE | Admit: 2021-01-12 | Discharge: 2021-01-15 | DRG: 470 | Disposition: A | Discharge status: Skilled Nursing Facility | Payer: Medicare Other | Attending: Orthopedic Surgery | Admitting: Orthopedic Surgery

## 2021-01-12 ENCOUNTER — Ambulatory Visit: Payer: Medicare Other | Admitting: Urgent Care

## 2021-01-12 ENCOUNTER — Encounter
Admission: RE | Disposition: A | Discharge status: Skilled Nursing Facility | Payer: Self-pay | Source: Home / Self Care | Attending: Orthopedic Surgery

## 2021-01-12 ENCOUNTER — Encounter: Payer: Self-pay | Admitting: Orthopedic Surgery

## 2021-01-12 ENCOUNTER — Other Ambulatory Visit: Payer: Self-pay

## 2021-01-12 ENCOUNTER — Observation Stay: Payer: Medicare Other

## 2021-01-12 DIAGNOSIS — G8918 Other acute postprocedural pain: Secondary | ICD-10-CM

## 2021-01-12 DIAGNOSIS — F419 Anxiety disorder, unspecified: Secondary | ICD-10-CM | POA: Diagnosis present

## 2021-01-12 DIAGNOSIS — Z7982 Long term (current) use of aspirin: Secondary | ICD-10-CM

## 2021-01-12 DIAGNOSIS — E785 Hyperlipidemia, unspecified: Secondary | ICD-10-CM | POA: Diagnosis present

## 2021-01-12 DIAGNOSIS — I080 Rheumatic disorders of both mitral and aortic valves: Secondary | ICD-10-CM | POA: Diagnosis present

## 2021-01-12 DIAGNOSIS — Z7984 Long term (current) use of oral hypoglycemic drugs: Secondary | ICD-10-CM

## 2021-01-12 DIAGNOSIS — Z96652 Presence of left artificial knee joint: Secondary | ICD-10-CM

## 2021-01-12 DIAGNOSIS — Z6831 Body mass index (BMI) 31.0-31.9, adult: Secondary | ICD-10-CM

## 2021-01-12 DIAGNOSIS — Z8249 Family history of ischemic heart disease and other diseases of the circulatory system: Secondary | ICD-10-CM

## 2021-01-12 DIAGNOSIS — I252 Old myocardial infarction: Secondary | ICD-10-CM

## 2021-01-12 DIAGNOSIS — I951 Orthostatic hypotension: Secondary | ICD-10-CM | POA: Diagnosis not present

## 2021-01-12 DIAGNOSIS — Z818 Family history of other mental and behavioral disorders: Secondary | ICD-10-CM

## 2021-01-12 DIAGNOSIS — E669 Obesity, unspecified: Secondary | ICD-10-CM | POA: Diagnosis present

## 2021-01-12 DIAGNOSIS — M1712 Unilateral primary osteoarthritis, left knee: Principal | ICD-10-CM | POA: Diagnosis present

## 2021-01-12 DIAGNOSIS — M8580 Other specified disorders of bone density and structure, unspecified site: Secondary | ICD-10-CM | POA: Diagnosis present

## 2021-01-12 DIAGNOSIS — E1165 Type 2 diabetes mellitus with hyperglycemia: Secondary | ICD-10-CM | POA: Diagnosis not present

## 2021-01-12 DIAGNOSIS — I129 Hypertensive chronic kidney disease with stage 1 through stage 4 chronic kidney disease, or unspecified chronic kidney disease: Secondary | ICD-10-CM | POA: Diagnosis present

## 2021-01-12 DIAGNOSIS — D62 Acute posthemorrhagic anemia: Secondary | ICD-10-CM | POA: Diagnosis not present

## 2021-01-12 DIAGNOSIS — K219 Gastro-esophageal reflux disease without esophagitis: Secondary | ICD-10-CM | POA: Diagnosis present

## 2021-01-12 DIAGNOSIS — Z803 Family history of malignant neoplasm of breast: Secondary | ICD-10-CM

## 2021-01-12 DIAGNOSIS — Z91041 Radiographic dye allergy status: Secondary | ICD-10-CM

## 2021-01-12 DIAGNOSIS — F32A Depression, unspecified: Secondary | ICD-10-CM | POA: Diagnosis present

## 2021-01-12 DIAGNOSIS — Z888 Allergy status to other drugs, medicaments and biological substances status: Secondary | ICD-10-CM

## 2021-01-12 DIAGNOSIS — Z8616 Personal history of COVID-19: Secondary | ICD-10-CM

## 2021-01-12 DIAGNOSIS — N1831 Chronic kidney disease, stage 3a: Secondary | ICD-10-CM | POA: Diagnosis present

## 2021-01-12 HISTORY — PX: TOTAL KNEE ARTHROPLASTY: SHX125

## 2021-01-12 LAB — CREATININE, SERUM
Creatinine, Ser: 1.08 mg/dL — ABNORMAL HIGH (ref 0.44–1.00)
GFR, Estimated: 52 mL/min — ABNORMAL LOW (ref >60)

## 2021-01-12 LAB — TYPE AND SCREEN
ABO/RH(D): O POS
Antibody Screen: NEGATIVE

## 2021-01-12 LAB — GLUCOSE, CAPILLARY
Glucose-Capillary: 128 mg/dL — ABNORMAL HIGH (ref 70–99)
Glucose-Capillary: 130 mg/dL — ABNORMAL HIGH (ref 70–99)
Glucose-Capillary: 124 mg/dL — ABNORMAL HIGH (ref 70–99)
Glucose-Capillary: 115 mg/dL — ABNORMAL HIGH (ref 70–99)
Glucose-Capillary: 170 mg/dL — ABNORMAL HIGH (ref 70–99)

## 2021-01-12 LAB — CBC
HCT: 32 % — ABNORMAL LOW (ref 36.0–46.0)
Hemoglobin: 10.4 g/dL — ABNORMAL LOW (ref 12.0–15.0)
MCH: 29 pg (ref 26.0–34.0)
MCHC: 32.5 g/dL (ref 30.0–36.0)
MCV: 89.1 fL (ref 80.0–100.0)
Platelets: 247 10*3/uL (ref 150–400)
RBC: 3.59 MIL/uL — ABNORMAL LOW (ref 3.87–5.11)
RDW: 15.9 % — ABNORMAL HIGH (ref 11.5–15.5)
WBC: 6.5 10*3/uL (ref 4.0–10.5)
nRBC: 0 % (ref 0.0–0.2)

## 2021-01-12 SURGERY — ARTHROPLASTY, KNEE, TOTAL
Anesthesia: General | Site: Knee | Laterality: Left

## 2021-01-12 MED ORDER — NEOMYCIN-POLYMYXIN B GU 40-200000 IR SOLN
Status: AC
  Filled 2021-01-12: qty 40

## 2021-01-12 MED ORDER — INSULIN ASPART 100 UNIT/ML IJ SOLN
0.0000 [IU] | Freq: Three times a day (TID) | INTRAMUSCULAR | Status: DC
Start: 2021-01-12 — End: 2021-01-15
  Administered 2021-01-12 – 2021-01-13 (×3): 2 [IU] via SUBCUTANEOUS
  Administered 2021-01-13: 3 [IU] via SUBCUTANEOUS
  Administered 2021-01-14: 6 [IU] via SUBCUTANEOUS
  Administered 2021-01-15: 1 [IU] via SUBCUTANEOUS
  Administered 2021-01-15: 2 [IU] via SUBCUTANEOUS
  Filled 2021-01-12 (×7): qty 1

## 2021-01-12 MED ORDER — BUPIVACAINE HCL (PF) 0.5 % IJ SOLN
INTRAMUSCULAR | Status: AC
  Filled 2021-01-12: qty 10

## 2021-01-12 MED ORDER — METHOCARBAMOL 500 MG PO TABS
500.0000 mg | ORAL_TABLET | Freq: Four times a day (QID) | ORAL | Status: DC | PRN
  Administered 2021-01-13: 500 mg via ORAL
  Filled 2021-01-12: qty 1

## 2021-01-12 MED ORDER — ONDANSETRON HCL 4 MG PO TABS
4.0000 mg | ORAL_TABLET | Freq: Four times a day (QID) | ORAL | Status: DC | PRN
  Administered 2021-01-13: 4 mg via ORAL
  Filled 2021-01-12: qty 1

## 2021-01-12 MED ORDER — DEXAMETHASONE SODIUM PHOSPHATE 10 MG/ML IJ SOLN
INTRAMUSCULAR | Status: AC
  Filled 2021-01-12: qty 1

## 2021-01-12 MED ORDER — ORAL CARE MOUTH RINSE: 15.0000 mL | Freq: Once | OROMUCOSAL | Status: AC

## 2021-01-12 MED ORDER — SODIUM CHLORIDE FLUSH 0.9 % IV SOLN
INTRAVENOUS | Status: AC
  Filled 2021-01-12: qty 80

## 2021-01-12 MED ORDER — ADULT MULTIVITAMIN W/MINERALS CH
1.0000 | ORAL_TABLET | Freq: Every day | ORAL | Status: DC
  Administered 2021-01-13 – 2021-01-15 (×3): 1 via ORAL
  Filled 2021-01-12 (×3): qty 1

## 2021-01-12 MED ORDER — BUPIVACAINE HCL (PF) 0.5 % IJ SOLN
INTRAMUSCULAR | Status: DC | PRN
  Administered 2021-01-12: 2.5 mL

## 2021-01-12 MED ORDER — FENTANYL CITRATE (PF) 100 MCG/2ML IJ SOLN: 25.0000 ug | INTRAMUSCULAR | Status: DC | PRN

## 2021-01-12 MED ORDER — PHENYLEPHRINE HCL (PRESSORS) 10 MG/ML IV SOLN
INTRAVENOUS | Status: AC
  Filled 2021-01-12: qty 2

## 2021-01-12 MED ORDER — ENOXAPARIN SODIUM 30 MG/0.3ML IJ SOSY
30.0000 mg | PREFILLED_SYRINGE | Freq: Two times a day (BID) | INTRAMUSCULAR | Status: DC
  Administered 2021-01-13 – 2021-01-15 (×5): 30 mg via SUBCUTANEOUS
  Filled 2021-01-12 (×5): qty 0.3

## 2021-01-12 MED ORDER — PROPOFOL 10 MG/ML IV BOLUS
INTRAVENOUS | Status: DC | PRN
  Administered 2021-01-12: 30 mg via INTRAVENOUS

## 2021-01-12 MED ORDER — BISACODYL 10 MG RE SUPP: 10.0000 mg | Freq: Every day | RECTAL | Status: DC | PRN

## 2021-01-12 MED ORDER — ONDANSETRON HCL 4 MG/2ML IJ SOLN
INTRAMUSCULAR | Status: DC | PRN
  Administered 2021-01-12: 4 mg via INTRAVENOUS

## 2021-01-12 MED ORDER — OXYCODONE HCL 5 MG PO TABS: 5.0000 mg | ORAL_TABLET | ORAL | Status: DC | PRN

## 2021-01-12 MED ORDER — PHENYLEPHRINE HCL-NACL 20-0.9 MG/250ML-% IV SOLN
INTRAVENOUS | Status: DC | PRN
  Administered 2021-01-12: 20 ug/min via INTRAVENOUS

## 2021-01-12 MED ORDER — PHENOL 1.4 % MT LIQD
1.0000 | OROMUCOSAL | Status: DC | PRN
  Filled 2021-01-12: qty 177

## 2021-01-12 MED ORDER — SURGIPHOR WOUND IRRIGATION SYSTEM - OPTIME
TOPICAL | Status: DC | PRN
  Administered 2021-01-12: 1 via TOPICAL

## 2021-01-12 MED ORDER — ASPIRIN EC 81 MG PO TBEC
81.0000 mg | DELAYED_RELEASE_TABLET | Freq: Every day | ORAL | Status: DC
  Administered 2021-01-12 – 2021-01-15 (×4): 81 mg via ORAL
  Filled 2021-01-12 (×4): qty 1

## 2021-01-12 MED ORDER — FENTANYL CITRATE (PF) 100 MCG/2ML IJ SOLN
INTRAMUSCULAR | Status: DC | PRN
  Administered 2021-01-12: 25 ug via INTRAVENOUS

## 2021-01-12 MED ORDER — METHOCARBAMOL 1000 MG/10ML IJ SOLN
500.0000 mg | Freq: Four times a day (QID) | INTRAVENOUS | Status: DC | PRN
  Filled 2021-01-12: qty 5

## 2021-01-12 MED ORDER — PHENYLEPHRINE HCL (PRESSORS) 10 MG/ML IV SOLN: INTRAVENOUS | Status: DC | PRN

## 2021-01-12 MED ORDER — ACETAMINOPHEN 500 MG PO TABS
1000.0000 mg | ORAL_TABLET | Freq: Four times a day (QID) | ORAL | Status: AC
  Administered 2021-01-12 – 2021-01-13 (×4): 1000 mg via ORAL
  Filled 2021-01-12 (×5): qty 2

## 2021-01-12 MED ORDER — MAGNESIUM HYDROXIDE 400 MG/5ML PO SUSP
30.0000 mL | Freq: Every day | ORAL | Status: DC
  Administered 2021-01-12 – 2021-01-13 (×2): 30 mL via ORAL
  Filled 2021-01-12: qty 30

## 2021-01-12 MED ORDER — BUPIVACAINE-EPINEPHRINE (PF) 0.25% -1:200000 IJ SOLN
INTRAMUSCULAR | Status: AC
  Filled 2021-01-12: qty 60

## 2021-01-12 MED ORDER — MORPHINE SULFATE (PF) 10 MG/ML IV SOLN
INTRAVENOUS | Status: AC
  Filled 2021-01-12: qty 2

## 2021-01-12 MED ORDER — FENTANYL CITRATE (PF) 100 MCG/2ML IJ SOLN
INTRAMUSCULAR | Status: AC
  Filled 2021-01-12: qty 2

## 2021-01-12 MED ORDER — CEFAZOLIN SODIUM-DEXTROSE 2-4 GM/100ML-% IV SOLN
2.0000 g | INTRAVENOUS | Status: AC
  Administered 2021-01-12: 2 g via INTRAVENOUS

## 2021-01-12 MED ORDER — DOCUSATE SODIUM 100 MG PO CAPS
100.0000 mg | ORAL_CAPSULE | Freq: Two times a day (BID) | ORAL | Status: DC
  Administered 2021-01-12 – 2021-01-15 (×6): 100 mg via ORAL
  Filled 2021-01-12 (×8): qty 1

## 2021-01-12 MED ORDER — MIDAZOLAM HCL 2 MG/2ML IJ SOLN
INTRAMUSCULAR | Status: DC | PRN
  Administered 2021-01-12 (×2): 1 mg via INTRAVENOUS

## 2021-01-12 MED ORDER — PROPOFOL 10 MG/ML IV BOLUS
INTRAVENOUS | Status: AC
  Filled 2021-01-12: qty 20

## 2021-01-12 MED ORDER — MIDAZOLAM HCL 2 MG/2ML IJ SOLN
INTRAMUSCULAR | Status: AC
  Filled 2021-01-12: qty 2

## 2021-01-12 MED ORDER — HYDROMORPHONE HCL 1 MG/ML IJ SOLN
0.5000 mg | INTRAMUSCULAR | Status: DC | PRN
Start: 2021-01-12 — End: 2021-01-15
  Administered 2021-01-12 – 2021-01-13 (×2): 1 mg via INTRAVENOUS
  Filled 2021-01-12 (×2): qty 1

## 2021-01-12 MED ORDER — APREPITANT 40 MG PO CAPS
ORAL_CAPSULE | ORAL | Status: AC
  Administered 2021-01-12: 40 mg via ORAL
  Filled 2021-01-12: qty 1

## 2021-01-12 MED ORDER — AMLODIPINE BESYLATE 5 MG PO TABS
5.0000 mg | ORAL_TABLET | Freq: Every day | ORAL | Status: DC
  Administered 2021-01-12 – 2021-01-13 (×2): 5 mg via ORAL
  Filled 2021-01-12 (×2): qty 1

## 2021-01-12 MED ORDER — TRAMADOL HCL 50 MG PO TABS
50.0000 mg | ORAL_TABLET | Freq: Four times a day (QID) | ORAL | Status: DC
  Administered 2021-01-12 – 2021-01-14 (×8): 50 mg via ORAL
  Filled 2021-01-12 (×8): qty 1

## 2021-01-12 MED ORDER — ONDANSETRON HCL 4 MG/2ML IJ SOLN
INTRAMUSCULAR | Status: AC
  Filled 2021-01-12: qty 2

## 2021-01-12 MED ORDER — PROPOFOL 1000 MG/100ML IV EMUL
INTRAVENOUS | Status: AC
  Filled 2021-01-12: qty 100

## 2021-01-12 MED ORDER — SODIUM CHLORIDE (PF) 0.9 % IJ SOLN
INTRAMUSCULAR | Status: DC | PRN
  Administered 2021-01-12: 91 mL via INTRAMUSCULAR

## 2021-01-12 MED ORDER — ONDANSETRON HCL 4 MG/2ML IJ SOLN: 4.0000 mg | Freq: Once | INTRAMUSCULAR | Status: DC | PRN

## 2021-01-12 MED ORDER — ACETAMINOPHEN 325 MG PO TABS: 325.0000 mg | ORAL_TABLET | Freq: Four times a day (QID) | ORAL | Status: DC | PRN

## 2021-01-12 MED ORDER — ATORVASTATIN CALCIUM 20 MG PO TABS
40.0000 mg | ORAL_TABLET | Freq: Every day | ORAL | Status: DC
  Administered 2021-01-12 – 2021-01-15 (×3): 40 mg via ORAL
  Filled 2021-01-12 (×3): qty 2

## 2021-01-12 MED ORDER — METFORMIN HCL ER 500 MG PO TB24
500.0000 mg | ORAL_TABLET | Freq: Every day | ORAL | Status: DC
  Administered 2021-01-13 – 2021-01-15 (×3): 500 mg via ORAL
  Filled 2021-01-12 (×4): qty 1

## 2021-01-12 MED ORDER — CHLORHEXIDINE GLUCONATE 0.12 % MT SOLN
OROMUCOSAL | Status: AC
  Administered 2021-01-12: 15 mL via OROMUCOSAL
  Filled 2021-01-12: qty 15

## 2021-01-12 MED ORDER — MENTHOL 3 MG MT LOZG
1.0000 | LOZENGE | OROMUCOSAL | Status: DC | PRN
  Filled 2021-01-12: qty 9

## 2021-01-12 MED ORDER — CEFAZOLIN SODIUM-DEXTROSE 2-4 GM/100ML-% IV SOLN
INTRAVENOUS | Status: AC
  Administered 2021-01-12: 2 g via INTRAVENOUS
  Filled 2021-01-12: qty 100

## 2021-01-12 MED ORDER — ROCURONIUM BROMIDE 10 MG/ML (PF) SYRINGE
PREFILLED_SYRINGE | INTRAVENOUS | Status: AC
  Filled 2021-01-12: qty 10

## 2021-01-12 MED ORDER — SODIUM CHLORIDE 0.9 % IV SOLN: INTRAVENOUS | Status: DC

## 2021-01-12 MED ORDER — NEOMYCIN-POLYMYXIN B GU 40-200000 IR SOLN
Status: DC | PRN
  Administered 2021-01-12: 12 mL

## 2021-01-12 MED ORDER — HYDRALAZINE HCL 50 MG PO TABS
50.0000 mg | ORAL_TABLET | Freq: Three times a day (TID) | ORAL | Status: DC
  Administered 2021-01-12 – 2021-01-13 (×6): 50 mg via ORAL
  Filled 2021-01-12 (×8): qty 1

## 2021-01-12 MED ORDER — PROPOFOL 500 MG/50ML IV EMUL
INTRAVENOUS | Status: DC | PRN
  Administered 2021-01-12: 75 ug/kg/min via INTRAVENOUS

## 2021-01-12 MED ORDER — EPHEDRINE SULFATE 50 MG/ML IJ SOLN
INTRAMUSCULAR | Status: DC | PRN
  Administered 2021-01-12: 10 mg via INTRAVENOUS

## 2021-01-12 MED ORDER — ALUM & MAG HYDROXIDE-SIMETH 200-200-20 MG/5ML PO SUSP: 30.0000 mL | ORAL | Status: DC | PRN

## 2021-01-12 MED ORDER — LISINOPRIL 20 MG PO TABS
40.0000 mg | ORAL_TABLET | Freq: Every day | ORAL | Status: DC
  Administered 2021-01-12 – 2021-01-13 (×2): 40 mg via ORAL
  Filled 2021-01-12 (×2): qty 2

## 2021-01-12 MED ORDER — ACETAMINOPHEN 10 MG/ML IV SOLN
INTRAVENOUS | Status: AC
  Filled 2021-01-12: qty 100

## 2021-01-12 MED ORDER — CEFAZOLIN SODIUM-DEXTROSE 2-4 GM/100ML-% IV SOLN
2.0000 g | Freq: Four times a day (QID) | INTRAVENOUS | Status: AC
  Administered 2021-01-12: 2 g via INTRAVENOUS
  Filled 2021-01-12 (×2): qty 100

## 2021-01-12 MED ORDER — APREPITANT 40 MG PO CAPS: 40.0000 mg | ORAL_CAPSULE | Freq: Once | ORAL | Status: AC

## 2021-01-12 MED ORDER — ACETAMINOPHEN 10 MG/ML IV SOLN
INTRAVENOUS | Status: DC | PRN
  Administered 2021-01-12: 1000 mg via INTRAVENOUS

## 2021-01-12 MED ORDER — FAMOTIDINE 20 MG PO TABS
40.0000 mg | ORAL_TABLET | Freq: Every day | ORAL | Status: DC
  Administered 2021-01-12 – 2021-01-15 (×3): 40 mg via ORAL
  Filled 2021-01-12 (×3): qty 2

## 2021-01-12 MED ORDER — MAGNESIUM OXIDE -MG SUPPLEMENT 400 (240 MG) MG PO TABS
400.0000 mg | ORAL_TABLET | Freq: Every day | ORAL | Status: DC
  Administered 2021-01-12 – 2021-01-15 (×4): 400 mg via ORAL
  Filled 2021-01-12 (×4): qty 1

## 2021-01-12 MED ORDER — LIDOCAINE HCL (PF) 2 % IJ SOLN
INTRAMUSCULAR | Status: AC
  Filled 2021-01-12: qty 5

## 2021-01-12 MED ORDER — POLYETHYLENE GLYCOL 3350 17 G PO PACK: 17.0000 g | PACK | Freq: Every day | ORAL | Status: DC | PRN

## 2021-01-12 MED ORDER — DIPHENHYDRAMINE HCL 12.5 MG/5ML PO ELIX: 12.5000 mg | ORAL_SOLUTION | ORAL | Status: DC | PRN

## 2021-01-12 MED ORDER — ZOLPIDEM TARTRATE 5 MG PO TABS
5.0000 mg | ORAL_TABLET | Freq: Every evening | ORAL | Status: DC | PRN
  Administered 2021-01-12 – 2021-01-14 (×2): 5 mg via ORAL
  Filled 2021-01-12 (×2): qty 1

## 2021-01-12 MED ORDER — TRANEXAMIC ACID 1000 MG/10ML IV SOLN
INTRAVENOUS | Status: AC
  Filled 2021-01-12: qty 10

## 2021-01-12 MED ORDER — OXYCODONE HCL 5 MG PO TABS
10.0000 mg | ORAL_TABLET | ORAL | Status: DC | PRN
Start: 2021-01-12 — End: 2021-01-14
  Administered 2021-01-12 – 2021-01-13 (×5): 15 mg via ORAL
  Filled 2021-01-12 (×5): qty 3

## 2021-01-12 MED ORDER — DULOXETINE HCL 60 MG PO CPEP
120.0000 mg | ORAL_CAPSULE | Freq: Every day | ORAL | Status: DC
  Administered 2021-01-12 – 2021-01-15 (×4): 120 mg via ORAL
  Filled 2021-01-12 (×4): qty 2

## 2021-01-12 MED ORDER — ONDANSETRON HCL 4 MG/2ML IJ SOLN: 4.0000 mg | Freq: Four times a day (QID) | INTRAMUSCULAR | Status: DC | PRN

## 2021-01-12 MED ORDER — PANTOPRAZOLE SODIUM 40 MG PO TBEC
80.0000 mg | DELAYED_RELEASE_TABLET | Freq: Every day | ORAL | Status: DC
  Administered 2021-01-13 – 2021-01-15 (×3): 80 mg via ORAL
  Filled 2021-01-12 (×3): qty 2

## 2021-01-12 MED ORDER — GLYCOPYRROLATE 0.2 MG/ML IJ SOLN
INTRAMUSCULAR | Status: DC | PRN
  Administered 2021-01-12 (×2): .1 mg via INTRAVENOUS

## 2021-01-12 MED ORDER — CHLORHEXIDINE GLUCONATE 0.12 % MT SOLN: 15.0000 mL | Freq: Once | OROMUCOSAL | Status: AC

## 2021-01-12 MED ORDER — BUPIVACAINE LIPOSOME 1.3 % IJ SUSP
INTRAMUSCULAR | Status: AC
  Filled 2021-01-12: qty 40

## 2021-01-12 MED ORDER — FLEET ENEMA 7-19 GM/118ML RE ENEM: 1.0000 | ENEMA | Freq: Once | RECTAL | Status: DC | PRN

## 2021-01-12 MED ORDER — SODIUM CHLORIDE 0.9 % IR SOLN
Status: DC | PRN
  Administered 2021-01-12: 3000 mL

## 2021-01-12 MED ORDER — TRANEXAMIC ACID-NACL 1000-0.7 MG/100ML-% IV SOLN
INTRAVENOUS | Status: DC | PRN
  Administered 2021-01-12: 1000 mg via INTRAVENOUS

## 2021-01-12 SURGICAL SUPPLY — 72 items
BLADE SAGITTAL 25.0X1.19X90 (BLADE) IMPLANT
BLADE SAW 90X13X1.19 OSCILLAT (BLADE) ×2 IMPLANT
BLADE SAW 90X25X1.19 OSCILLAT (BLADE) ×2 IMPLANT
BLOCK CUTTING FEMUR 4+ LT MED (MISCELLANEOUS) ×2 IMPLANT
BLOCK CUTTING TIBIAL 3 LT (MISCELLANEOUS) ×2 IMPLANT
BNDG ELASTIC 6X5.8 VLCR STR LF (GAUZE/BANDAGES/DRESSINGS) IMPLANT
CANISTER WOUND CARE 500ML ATS (WOUND CARE) ×2 IMPLANT
CEMENT HV SMART SET (Cement) ×4 IMPLANT
CHLORAPREP W/TINT 26 (MISCELLANEOUS) ×4 IMPLANT
COOLER POLAR GLACIER W/PUMP (MISCELLANEOUS) ×2 IMPLANT
CUFF TOURN SGL QUICK 24 (TOURNIQUET CUFF)
CUFF TOURN SGL QUICK 34 (TOURNIQUET CUFF)
CUFF TRNQT CYL 24X4X16.5-23 (TOURNIQUET CUFF) IMPLANT
CUFF TRNQT CYL 34X4.125X (TOURNIQUET CUFF) IMPLANT
DRAPE 3/4 80X56 (DRAPES) ×4 IMPLANT
DRSG MEPILEX SACRM 8.7X9.8 (GAUZE/BANDAGES/DRESSINGS) ×2 IMPLANT
ELECT CAUTERY BLADE 6.4 (BLADE) ×2 IMPLANT
ELECT REM PT RETURN 9FT ADLT (ELECTROSURGICAL) ×2
ELECTRODE REM PT RTRN 9FT ADLT (ELECTROSURGICAL) ×1 IMPLANT
FEMORAL COMP SZ4P LT SPHERE (Femur) ×2 IMPLANT
FEMUR BONE MODEL 4.9010 MEDACT (MISCELLANEOUS) ×2 IMPLANT
GAUZE 4X4 16PLY ~~LOC~~+RFID DBL (SPONGE) ×2 IMPLANT
GAUZE SPONGE 4X4 12PLY STRL (GAUZE/BANDAGES/DRESSINGS) ×2 IMPLANT
GAUZE XEROFORM 1X8 LF (GAUZE/BANDAGES/DRESSINGS) IMPLANT
GLOVE SURG ORTHO LTX SZ8 (GLOVE) ×2 IMPLANT
GLOVE SURG SYN 9.0  PF PI (GLOVE) ×1
GLOVE SURG SYN 9.0 PF PI (GLOVE) ×1 IMPLANT
GLOVE SURG UNDER LTX SZ8 (GLOVE) ×2 IMPLANT
GLOVE SURG UNDER POLY LF SZ9 (GLOVE) ×2 IMPLANT
GOWN SRG 2XL LVL 4 RGLN SLV (GOWNS) ×1 IMPLANT
GOWN STRL NON-REIN 2XL LVL4 (GOWNS) ×1
GOWN STRL REUS W/ TWL LRG LVL3 (GOWN DISPOSABLE) ×1 IMPLANT
GOWN STRL REUS W/ TWL XL LVL3 (GOWN DISPOSABLE) ×1 IMPLANT
GOWN STRL REUS W/TWL LRG LVL3 (GOWN DISPOSABLE) ×1
GOWN STRL REUS W/TWL XL LVL3 (GOWN DISPOSABLE) ×1
HOLDER FOLEY CATH W/STRAP (MISCELLANEOUS) ×2 IMPLANT
IRRIGATION SURGIPHOR STRL (IV SOLUTION) ×2 IMPLANT
IV NS IRRIG 3000ML ARTHROMATIC (IV SOLUTION) ×2 IMPLANT
KIT PREVENA INCISION MGT20CM45 (CANNISTER) ×2 IMPLANT
KIT TURNOVER KIT A (KITS) ×2 IMPLANT
MANIFOLD NEPTUNE II (INSTRUMENTS) ×2 IMPLANT
NDL SAFETY ECLIPSE 18X1.5 (NEEDLE) ×3 IMPLANT
NEEDLE HYPO 18GX1.5 SHARP (NEEDLE) ×3
NEEDLE SPNL 18GX3.5 QUINCKE PK (NEEDLE) ×2 IMPLANT
NEEDLE SPNL 20GX3.5 QUINCKE YW (NEEDLE) ×2 IMPLANT
NS IRRIG 1000ML POUR BTL (IV SOLUTION) IMPLANT
PACK TOTAL KNEE (MISCELLANEOUS) ×2 IMPLANT
PAD WRAPON POLAR KNEE (MISCELLANEOUS) ×1 IMPLANT
PATELLA RESURFACING MEDACTA 02 (Bone Implant) ×2 IMPLANT
PENCIL SMOKE EVACUATOR COATED (MISCELLANEOUS) ×2 IMPLANT
PULSAVAC PLUS IRRIG FAN TIP (DISPOSABLE) ×2
SCALPEL PROTECTED #10 DISP (BLADE) ×4 IMPLANT
SPONGE T-LAP 18X18 ~~LOC~~+RFID (SPONGE) ×6 IMPLANT
STAPLER SKIN PROX 35W (STAPLE) ×2 IMPLANT
STEM EXTENSION 11MMX30MM (Stem) ×2 IMPLANT
SUCTION FRAZIER HANDLE 10FR (MISCELLANEOUS) ×1
SUCTION TUBE FRAZIER 10FR DISP (MISCELLANEOUS) ×1 IMPLANT
SUT DVC 2 QUILL PDO  T11 36X36 (SUTURE) ×1
SUT DVC 2 QUILL PDO T11 36X36 (SUTURE) ×1 IMPLANT
SUT ETHIBOND 2 V 37 (SUTURE) ×2 IMPLANT
SUT V-LOC 90 ABS DVC 3-0 CL (SUTURE) ×2 IMPLANT
SYR 20ML LL LF (SYRINGE) ×2 IMPLANT
SYR 50ML LL SCALE MARK (SYRINGE) ×4 IMPLANT
TIBIAL BONE MODEL LEFT (MISCELLANEOUS) ×2 IMPLANT
TIBIAL INSERT SZ4 LT 02120410F (Insert) ×2 IMPLANT
TIP FAN IRRIG PULSAVAC PLUS (DISPOSABLE) ×1 IMPLANT
TOWEL OR 17X26 4PK STRL BLUE (TOWEL DISPOSABLE) IMPLANT
TOWER CARTRIDGE SMART MIX (DISPOSABLE) ×2 IMPLANT
TRAY FOLEY MTR SLVR 16FR STAT (SET/KITS/TRAYS/PACK) ×2 IMPLANT
TRAY TIBIAL FIXED T3I4 LEFT (Miscellaneous) ×2 IMPLANT
WATER STERILE IRR 500ML POUR (IV SOLUTION) ×2 IMPLANT
WRAPON POLAR PAD KNEE (MISCELLANEOUS) ×2

## 2021-01-12 NOTE — Transfer of Care (Cosign Needed)
Immediate Anesthesia Transfer of Care Note  Patient: Jean Stout  Procedure(s) Performed: TOTAL KNEE ARTHROPLASTY (Left: Knee)  Patient Location: PACU  Anesthesia Type:General and Spinal  Level of Consciousness: awake, alert  and oriented  Airway & Oxygen Therapy: Patient Spontanous Breathing and Patient connected to face mask oxygen  Post-op Assessment: Report given to RN and Post -op Vital signs reviewed and stable  Post vital signs: Reviewed and stable  Last Vitals:  Vitals Value Taken Time  BP 113/94 01/12/21 0930  Temp 36.3 C 01/12/21 0929  Pulse 68 01/12/21 0936  Resp 12 01/12/21 0936  SpO2 100 % 01/12/21 0936  Vitals shown include unvalidated device data.  Last Pain:  Vitals:   01/12/21 0929  TempSrc:   PainSc: 0-No pain         Complications: No notable events documented.

## 2021-01-12 NOTE — Anesthesia Procedure Notes (Cosign Needed)
Spinal  Patient location during procedure: OR Start time: 01/12/2021 7:28 AM End time: 01/12/2021 7:36 AM Reason for block: surgical anesthesia Staffing Performed: anesthesiologist and other anesthesia staff  Anesthesiologist: Alver Fisher, MD Other anesthesia staff: Gari Crown, RN Preanesthetic Checklist Completed: patient identified, IV checked, site marked, risks and benefits discussed, surgical consent, monitors and equipment checked, pre-op evaluation and timeout performed Spinal Block Patient position: sitting Prep: ChloraPrep Patient monitoring: heart rate, cardiac monitor, continuous pulse ox and blood pressure Approach: midline Location: L3-4 Injection technique: single-shot Needle Needle type: Pencan  Needle gauge: 24 G Needle length: 9 cm Assessment Sensory level: T4 Events: CSF return

## 2021-01-12 NOTE — Evaluation (Signed)
Physical Therapy Evaluation Patient Details Name: Jean Stout MRN: [id number] DOB: June 29, 1940 Today's Date: 01/12/2021  History of Present Illness  Pt is 80 y.o. female s/p L TKA on 01/12/21.  PMH includes: CAD, MI, aortic stenosis, mild MV regurgitation, cardiac murmur, HTN, HLD, T2DM, CKD-III, OSAH (not complaint with nocturnal PAP therapy), GERD (on daily PPI), OA, osteopenia, anxiety, depression.    Clinical Impression  Pt received in Semi-Fowler's position and agreeable to therapy.  Pt able to perform bed-level exercises with good technique.  Pt does have some residual, expected weakness in the L LE, but performed mobility well.  Pt only able to ambulate 35 feet before getting dizzy and reporting some nausea.  Pt assisted into the recliner and BP was assessed.  BP 89/52, and 124/62 after sitting for a few minutes.  Pt then transferred back to bed with all needs met.  Nursing notified.  Pt will benefit from skilled PT intervention to increase independence and safety with basic mobility in preparation for discharge to the venue listed below.        Recommendations for follow up therapy are one component of a multi-disciplinary discharge planning process, led by the attending physician.  Recommendations may be updated based on patient status, additional functional criteria and insurance authorization.  Follow Up Recommendations Home health PT;Supervision for mobility/OOB    Equipment Recommendations  Rolling walker with 5" wheels;3in1 (PT)    Recommendations for Other Services       Precautions / Restrictions Precautions Precautions: Knee Precaution Booklet Issued: No Restrictions Weight Bearing Restrictions: Yes LLE Weight Bearing: Weight bearing as tolerated      Mobility  Bed Mobility Overal bed mobility: Needs Assistance Bed Mobility: Supine to Sit     Supine to sit: Min assist     General bed mobility comments: MinA for L LE mobility to the EOB.     Transfers Overall transfer level: Needs assistance Equipment used: Rolling walker (2 wheeled) Transfers: Sit to/from Stand Sit to Stand: Min guard         General transfer comment: Pt requiring VC's assist with proper hand placement.  Ambulation/Gait Ambulation/Gait assistance: Min guard Gait Distance (Feet): 35 Feet Assistive device: Rolling walker (2 wheeled) Gait Pattern/deviations: Step-through pattern Gait velocity: decreased   General Gait Details: Pt ambulated well, then started to feel dizzy and required to sit down.  BP dropped to 89/52 upon checking after being seated.  BP checked after a few minutes of sitting and was 124/62.  Stairs            Wheelchair Mobility    Modified Rankin (Stroke Patients Only)       Balance Overall balance assessment: Needs assistance Sitting-balance support: No upper extremity supported;Feet supported Sitting balance-Leahy Scale: Good     Standing balance support: Bilateral upper extremity supported Standing balance-Leahy Scale: Fair                               Pertinent Vitals/Pain Pain Assessment: 0-10 Pain Score: 6  Pain Location: L Knee Pain Descriptors / Indicators: Aching Pain Intervention(s): Limited activity within patient's tolerance;Monitored during session;Premedicated before session;Repositioned;Ice applied    Home Living Family/patient expects to be discharged to:: Private residence Living Arrangements: Other relatives Available Help at Discharge: Family;Available 24 hours/day Type of Home: Apartment Home Access: Level entry     Home Layout: One level Home Equipment: Toilet riser;Walker - 4 wheels;Grab bars - tub/shower;Hand held shower  head      Prior Function Level of Independence: Independent with assistive device(s)         Comments: Pt utilized cane for when outside the house.     Hand Dominance   Dominant Hand: Right    Extremity/Trunk Assessment   Upper  Extremity Assessment Upper Extremity Assessment: Generalized weakness    Lower Extremity Assessment Lower Extremity Assessment: Generalized weakness       Communication   Communication: No difficulties  Cognition Arousal/Alertness: Awake/alert Behavior During Therapy: WFL for tasks assessed/performed Overall Cognitive Status: Within Functional Limits for tasks assessed                                        General Comments      Exercises Total Joint Exercises Ankle Circles/Pumps: AROM;Strengthening;Both;20 reps;Supine Quad Sets: AROM;Strengthening;Both;10 reps;Supine Gluteal Sets: AROM;Strengthening;Both;10 reps;Supine Heel Slides: AROM;Strengthening;Both;5 reps;Supine Hip ABduction/ADduction: AROM;Strengthening;Both;10 reps;Supine Marching in Standing: AROM;Strengthening;Both;10 reps;Seated Other Exercises Other Exercises: Pt educated on roles of PT and services provided during hospital stay.  Pt also educated on the importance of mobility and performance of exercises in order to decrease risk of muscle atrophy.   Assessment/Plan    PT Assessment Patient needs continued PT services  PT Problem List Decreased strength;Decreased range of motion;Decreased activity tolerance;Decreased balance;Decreased mobility;Decreased knowledge of use of DME;Decreased safety awareness       PT Treatment Interventions DME instruction;Gait training;Functional mobility training;Therapeutic exercise;Therapeutic activities;Balance training;Neuromuscular re-education    PT Goals (Current goals can be found in the Care Plan section)  Acute Rehab PT Goals Patient Stated Goal: to go home. PT Goal Formulation: With patient Time For Goal Achievement: 01/26/21 Potential to Achieve Goals: Good    Frequency BID   Barriers to discharge Decreased caregiver support Son is able to assist with caregiving currently, but lives in [place], [place] and will be returning Saturday.  Family is  looking for options for assistance.    Co-evaluation               AM-PAC PT "6 Clicks" Mobility  Outcome Measure Help needed turning from your back to your side while in a flat bed without using bedrails?: A Little Help needed moving from lying on your back to sitting on the side of a flat bed without using bedrails?: A Little Help needed moving to and from a bed to a chair (including a wheelchair)?: A Little Help needed standing up from a chair using your arms (e.g., wheelchair or bedside chair)?: A Little Help needed to walk in hospital room?: A Lot Help needed climbing 3-5 steps with a railing? : A Lot 6 Click Score: 16    End of Session Equipment Utilized During Treatment: Gait belt Activity Tolerance: Patient tolerated treatment well Patient left: in bed;with call bell/phone within reach;with bed alarm set;with family/visitor present Nurse Communication: Mobility status PT Visit Diagnosis: Unsteadiness on feet (R26.81);Other abnormalities of gait and mobility (R26.89);Muscle weakness (generalized) (M62.81);Difficulty in walking, not elsewhere classified (R26.2);Pain Pain - Right/Left: Left Pain - part of body: Knee    Time: 5320-2334 PT Time Calculation (min) (ACUTE ONLY): 59 min   Charges:   PT Evaluation $PT Eval Low Complexity: 1 Low PT Treatments $Gait Training: 23-37 mins $Therapeutic Exercise: 8-22 mins        Gwenlyn Saran, PT, DPT 01/12/21, 4:20 PM   Christie Nottingham 01/12/2021, 4:15 PM

## 2021-01-12 NOTE — Anesthesia Preprocedure Evaluation (Signed)
Anesthesia Evaluation  Patient identified by MRN, date of birth, ID band Patient awake    Reviewed: Allergy & Precautions, NPO status , Patient's Chart, lab work & pertinent test results  History of Anesthesia Complications (+) PONV and history of anesthetic complications  Airway Mallampati: II  TM Distance: >3 FB Neck ROM: Full    Dental  (+) Poor Dentition   Pulmonary sleep apnea (noncompliant with CPAP) , neg COPD,    breath sounds clear to auscultation- rhonchi (-) wheezing      Cardiovascular hypertension, Pt. on medications + CAD, + Past MI and + Cardiac Stents (2010)  (-) CABG  Rhythm:Regular Rate:Normal - Systolic murmurs and - Diastolic murmurs    Neuro/Psych neg Seizures PSYCHIATRIC DISORDERS Anxiety Depression negative neurological ROS     GI/Hepatic Neg liver ROS, GERD  ,  Endo/Other  diabetes, Oral Hypoglycemic Agents  Renal/GU CRFRenal disease     Musculoskeletal  (+) Arthritis ,   Abdominal (+) + obese,   Peds  Hematology  (+) anemia ,   Anesthesia Other Findings Past Medical History: No date: Anxiety and depression No date: Aortic stenosis No date: Arthritis No date: Cardiac murmur     Comment:  a.) Grade I/VI medium pitched mid systolic blowing at               lower LSB No date: CKD (chronic kidney disease), stage III (HCC) No date: Coronary artery disease No date: GERD (gastroesophageal reflux disease) 12/29/2020: History of 2019 novel coronavirus disease (COVID-19)     Comment:  a.) aymptomatic; PCR (+) on 12/29/2020 with confirmatory              (+) rapid PCR on 12/30/2020. 2010: History of MI (myocardial infarction) No date: HLD (hyperlipidemia) No date: HTN (hypertension) No date: IDA (iron deficiency anemia) No date: Mild mitral regurgitation 2010: Myocardial infarction [facility])     Comment:  a.) Tx'd at [facility]; PCI with stents x 2               (unknown type and  location). No date: OSA on CPAP     Comment:  a.) not complaint with prescribed nocturnal PAP therapy No date: Osteopenia No date: PONV (postoperative nausea and vomiting) No date: T2DM (type 2 diabetes mellitus) (HCC) No date: Vaginal prolapse   Reproductive/Obstetrics                             Anesthesia Physical Anesthesia Plan  ASA: 3  Anesthesia Plan: Spinal   Post-op Pain Management:    Induction:   PONV Risk Score and Plan: 3 and Propofol infusion  Airway Management Planned: Natural Airway  Additional Equipment:   Intra-op Plan:   Post-operative Plan:   Informed Consent: I have reviewed the patients History and Physical, chart, labs and discussed the procedure including the risks, benefits and alternatives for the proposed anesthesia with the patient or authorized representative who has indicated his/her understanding and acceptance.     Dental advisory given  Plan Discussed with: CRNA and Anesthesiologist  Anesthesia Plan Comments:         Anesthesia Quick Evaluation

## 2021-01-12 NOTE — H&P (Signed)
Chief Complaint  Patient presents with   Pre-op Exam  Left TKA scheduled 01/12/21 by Dr. [name]  **  History of the Present Illness: Jean Stout is a 80 y.o. female here today for history and physical for left total knee arthroplasty with Dr. Kennedy Bucker on 01/12/2021. Patient has severe left knee pain. She has severe left knee osteoarthritis. She has had no relief with conservative treatment. Pain interferes with her quality of life and activities daily living. She is seen Dr. [name], discussed total knee arthroplasty and agreed consented procedure. Her surgery was originally scheduled for a couple weeks ago but unfortunately patient tested positive for COVID. Today, she states she is doing well with no symptoms.  I have reviewed past medical, surgical, social and family history, and allergies as documented in the EMR.  Past Medical History: Past Medical History:  Diagnosis Date   Allergy   Anxiety   Arthritis   Depression   Diabetes mellitus without complication (CMS-HCC)   GERD (gastroesophageal reflux disease)   History of myocardial infarction   Hyperlipidemia   Hypertension   Sleep apnea   Vaginal prolapse   Past Surgical History: Past Surgical History:  Procedure Laterality Date   CORONARY ANGIOPLASTY   KNEE ARTHROSCOPY   L2 FRACTURE 09/08/2020  Lumbar   REPLACEMENT TOTAL HIP W/ RESURFACING IMPLANTS Bilateral  Right - January 2007; Left - March 2007   Past Family History: Family History  Problem Relation Age of Onset   High blood pressure (Hypertension) Mother   Anxiety Mother   Myocardial Infarction (Heart attack) Father   Sudden cardiac death Father 26   Hyperlipidemia (Elevated cholesterol) Sister   High blood pressure (Hypertension) Sister   Myocardial Infarction (Heart attack) Sister   Myocardial Infarction (Heart attack) Brother   High blood pressure (Hypertension) Brother   Sudden cardiac death Brother 73   Breast cancer Brother   Testicular cancer  Brother   High blood pressure (Hypertension) Sister   Anxiety Sister   Breast cancer Daughter   Myocardial Infarction (Heart attack) Son   Hyperlipidemia (Elevated cholesterol) Son   Coronary Artery Disease (Blocked arteries around heart) Son   Hearing loss Brother   Atrial fibrillation (Abnormal heart rhythm sometimes requiring treatment with blood thinners) Brother   High blood pressure (Hypertension) Brother   Hyperlipidemia (Elevated cholesterol) Brother   Sleep apnea Son   Hyperlipidemia (Elevated cholesterol) Son   Medications: Current Outpatient Medications Ordered in Epic  Medication Sig Dispense Refill   amLODIPine (NORVASC) 5 MG tablet Take 1 tablet (5 mg total) by mouth once daily 90 tablet 1   aspirin 81 MG EC tablet Take 81 mg by mouth once daily (Patient not taking: Reported on 12/23/2020)   atorvastatin (LIPITOR) 40 MG tablet Take 1 tablet (40 mg total) by mouth once daily 90 tablet 1   calcitonin, salmon, (MIACALCIN) 200 unit/actuation nasal spray Place 1 spray into the left nostril once daily for 14 days 3.7 mL 0   cholecalciferol (VITAMIN D3) 1000 unit tablet Take by mouth (Patient not taking: Reported on 12/23/2020)   cinnamon bark (CINNAMON ORAL) Take by mouth daily (Patient not taking: Reported on 12/23/2020)   diclofenac (VOLTAREN) 1 % topical gel Apply 2 g topically 3 (three) times daily   DULoxetine (CYMBALTA) 60 MG DR capsule Take 2 capsules (120 mg total) by mouth once daily 180 capsule 1   famotidine (PEPCID) 40 MG tablet TAKE 1 TABLET BY MOUTH EVERY DAY AT NIGHT 90 tablet 1  glucosamine su 2KCl-chondroit 500-400 mg Tab Take by mouth (Patient not taking: Reported on 12/23/2020)   hydrALAZINE (APRESOLINE) 50 MG tablet TAKE 1 TABLET BY MOUTH THREE TIMES A DAY 270 tablet 3   HYDROcodone-acetaminophen (NORCO) 7.5-325 mg tablet Take 1 tablet by mouth every 6 (six) hours as needed for Pain   lisinopriL (ZESTRIL) 40 MG tablet TAKE 1 TABLET BY MOUTH EVERY DAY 90 tablet 1    magnesium oxide 500 mg Cap Take by mouth (Patient not taking: Reported on 12/23/2020)   metFORMIN (GLUCOPHAGE-XR) 500 MG XR tablet TAKE 1 TABLET BY MOUTH EVERY DAY 90 tablet 1   multivitamin tablet Take 1 tablet by mouth once daily (Patient not taking: Reported on 12/23/2020)   omeprazole (PRILOSEC) 20 MG DR capsule Take 1 capsule (20 mg total) by mouth once daily 90 capsule 1   No current Epic-ordered facility-administered medications on file.   Allergies: Allergies  Allergen Reactions   Gadolinium-Containing Contrast Media Rash   Iodinated Contrast Media Anaphylaxis   Bextra [Valdecoxib] Rash   Lotrel [Amlodipine-Benazepril] Rash    Body mass index is 31.64 kg/m.  Review of Systems: A comprehensive 14 point ROS was performed, reviewed, and the pertinent orthopaedic findings are documented in the HPI.  Vitals:  01/11/21 1125  BP: (!) 142/80    General Physical Examination:  General:  Well developed, well nourished, no apparent distress, normal affect, antalgic gait.  HEENT: Head normocephalic, atraumatic, PERRL.   Abdomen: Soft, non tender, non distended, Bowel sounds present.  Heart: Examination of the heart reveals regular, rate, and rhythm. There is no murmur noted on ascultation. There is a normal apical pulse.  Lungs: Lungs are clear to auscultation. There is no wheeze, rhonchi, or crackles. There is normal expansion of bilateral chest walls.   Left knee: Left knee shows mild swelling/effusion. She is able to straight leg raise. Patella tracks well. She is tender along the medial lateral joint line. No laxity valgus varus stress testing. She has 0 to 95 degrees range of motion. Negative Homans' sign.  Radiographs:  EXAM:  CT OF THE left KNEE WITHOUT CONTRAST   TECHNIQUE:  Multidetector CT imaging of the left knee was performed according to  the standard protocol. Multiplanar CT image reconstructions were  also generated.   COMPARISON:  None.    FINDINGS:  Bones/Joint/Cartilage   There is tricompartment osteophyte formation with subchondral  sclerosis and cystic change. There is moderate-severe medial  compartment joint space narrowing. There is an osteochondral defect  of the weight-bearing medial femoral condyle posteriorly measuring 5  mm in width. There is no evidence of acute fracture. There is a  moderate-sized joint effusion. Focal benign-appearing lucency in the  anteromedial femoral condyle.   Limited axial images of the left hip demonstrate a total hip  arthroplasty in normal alignment without evidence of loosening or  periprosthetic fracture.   Ligaments   Suboptimally assessed by CT.   Muscles and Tendons   No significant muscle atrophy.   Soft tissues   No focal fluid collection.   IMPRESSION:  Tricompartment osteoarthritis of the left knee, most severe in the  medial compartment with 5 mm osteochondral defect along the  posterior weight-bearing medial femoral condyle. Moderate-sized  joint effusion.   Assessment: ICD-10-CM  1. Primary osteoarthritis of left knee M17.12   Plan:  34. 80 year old female with advanced left knee osteoarthritis. She has failed conservative treatment and pain is interfering with quality of life and activities daily living. Risks, benefits, complications  of a left total knee arthroplasty have discussed with the patient. Patient has agreed and consented procedure with Dr. Kennedy Bucker on 01/12/2021.   Electronically signed by Patience Musca, PA at 01/11/2021 12:33 PM EDT   Reviewed  H+P. No changes noted.

## 2021-01-12 NOTE — Op Note (Signed)
01/12/2021  9:26 AM  PATIENT:  Jean Stout   MRN: [id number]  PRE-OPERATIVE DIAGNOSIS:  Primary localized osteoarthritis of left knee   POST-OPERATIVE DIAGNOSIS:  Same   PROCEDURE:  Procedure(s): Left TOTAL KNEE ARTHROPLASTY   SURGEON: Leitha Schuller, MD   ASSISTANTS: Cranston Neighbor, PA-C   ANESTHESIA:   spinal   EBL: 100   BLOOD ADMINISTERED:none   DRAINS:  Incisional wound VAC     LOCAL MEDICATIONS USED:  MARCAINE    and OTHER Exparel and morphine   SPECIMEN:  No Specimen   DISPOSITION OF SPECIMEN:  N/A   COUNTS:  YES   TOURNIQUET: 35 minutes at 300 mm Hg   IMPLANTS: Medacta  GMK sphere system with 4+ left femur, 3 TI 4 tibia with short stem and 10 mm insert.  Size 2 patella, all components cemented.   DICTATION: Reubin Milan Dictation   patient was brought to the operating room and spinal anesthesia was obtained.  After prepping and draping the left leg in sterile fashion, and after patient identification and timeout procedures were completed, tourniquet was raised  and midline skin incision was made followed by medial parapatellar arthrotomy with severe medial compartment osteoarthritis, severe patellofemoral arthritis and severe lateral compartment arthritis, with exposed bone in all 3 compartments, partial synovectomy was also carried out.   The ACL and PCL and fat pad were excised along with anterior horns of the meniscus. The proximal tibia cutting guide from  the [facility] system was applied and the proximal tibia cut carried out.  The distal femoral cut was carried out in a similar fashion.  With extension flexion gap check an additional 2 mm need to be removed because of flexion contracture, the 4+ femoral cutting guide applied with anterior posterior and chamfer cuts made.  The posterior horns of the menisci were removed at this point.   Injection of the above medication was carried out after the femoral and tibial cuts were carried out.  The 3 TI 4 baseplate trial was  placed pinned into position and proximal tibial preparation carried out with drilling hand reaming and the keel punch followed by placement of the 4+ femur and sizing the tibial insert size 10 millimeter gave the best fit with stability and full extension.  The distal femoral drill holes were made in the notch cut for the trochlear groove was then carried out with trials were then removed the patella was cut using the patellar cutting guide and it sized to a size 2after drill holes have been made  The knee was irrigated with pulsatile lavage and the bony surfaces dried the tibial component was cemented into place first.  Excess cement was removed and the polyethylene insert placed with a torque screw placed with a torque screwdriver tightened.  The distal femoral component was placed and the knee was held in extension as the patellar button was clamped into place.  After the cement was set, excess cement was removed and the knee was again irrigated thoroughly thoroughly irrigated.  The tourniquet was let down and hemostasis checked with electrocautery. The arthrotomy was repaired with a heavy Quill suture,  followed by 3-0 V lock subcuticular closure, skin staples followed by incisional wound VAC and Polar Care.Marland Kitchen   PLAN OF CARE: Admit for overnight observation   PATIENT DISPOSITION:  PACU - hemodynamically stable.

## 2021-01-13 ENCOUNTER — Encounter: Payer: Self-pay | Admitting: Orthopedic Surgery

## 2021-01-13 DIAGNOSIS — F419 Anxiety disorder, unspecified: Secondary | ICD-10-CM | POA: Diagnosis present

## 2021-01-13 DIAGNOSIS — Z7984 Long term (current) use of oral hypoglycemic drugs: Secondary | ICD-10-CM | POA: Diagnosis not present

## 2021-01-13 DIAGNOSIS — Z803 Family history of malignant neoplasm of breast: Secondary | ICD-10-CM | POA: Diagnosis not present

## 2021-01-13 DIAGNOSIS — Z91041 Radiographic dye allergy status: Secondary | ICD-10-CM | POA: Diagnosis not present

## 2021-01-13 DIAGNOSIS — Z7982 Long term (current) use of aspirin: Secondary | ICD-10-CM | POA: Diagnosis not present

## 2021-01-13 DIAGNOSIS — Z6831 Body mass index (BMI) 31.0-31.9, adult: Secondary | ICD-10-CM | POA: Diagnosis not present

## 2021-01-13 DIAGNOSIS — Z888 Allergy status to other drugs, medicaments and biological substances status: Secondary | ICD-10-CM | POA: Diagnosis not present

## 2021-01-13 DIAGNOSIS — E1165 Type 2 diabetes mellitus with hyperglycemia: Secondary | ICD-10-CM | POA: Diagnosis not present

## 2021-01-13 DIAGNOSIS — I129 Hypertensive chronic kidney disease with stage 1 through stage 4 chronic kidney disease, or unspecified chronic kidney disease: Secondary | ICD-10-CM | POA: Diagnosis present

## 2021-01-13 DIAGNOSIS — Z8616 Personal history of COVID-19: Secondary | ICD-10-CM | POA: Diagnosis not present

## 2021-01-13 DIAGNOSIS — Z8249 Family history of ischemic heart disease and other diseases of the circulatory system: Secondary | ICD-10-CM | POA: Diagnosis not present

## 2021-01-13 DIAGNOSIS — M8580 Other specified disorders of bone density and structure, unspecified site: Secondary | ICD-10-CM | POA: Diagnosis present

## 2021-01-13 DIAGNOSIS — N1831 Chronic kidney disease, stage 3a: Secondary | ICD-10-CM | POA: Diagnosis present

## 2021-01-13 DIAGNOSIS — E669 Obesity, unspecified: Secondary | ICD-10-CM | POA: Diagnosis present

## 2021-01-13 DIAGNOSIS — E785 Hyperlipidemia, unspecified: Secondary | ICD-10-CM | POA: Diagnosis present

## 2021-01-13 DIAGNOSIS — I252 Old myocardial infarction: Secondary | ICD-10-CM | POA: Diagnosis not present

## 2021-01-13 DIAGNOSIS — D62 Acute posthemorrhagic anemia: Secondary | ICD-10-CM | POA: Diagnosis not present

## 2021-01-13 DIAGNOSIS — Z818 Family history of other mental and behavioral disorders: Secondary | ICD-10-CM | POA: Diagnosis not present

## 2021-01-13 DIAGNOSIS — F32A Depression, unspecified: Secondary | ICD-10-CM | POA: Diagnosis present

## 2021-01-13 DIAGNOSIS — I951 Orthostatic hypotension: Secondary | ICD-10-CM | POA: Diagnosis not present

## 2021-01-13 DIAGNOSIS — I080 Rheumatic disorders of both mitral and aortic valves: Secondary | ICD-10-CM | POA: Diagnosis present

## 2021-01-13 DIAGNOSIS — K219 Gastro-esophageal reflux disease without esophagitis: Secondary | ICD-10-CM | POA: Diagnosis present

## 2021-01-13 DIAGNOSIS — M1712 Unilateral primary osteoarthritis, left knee: Secondary | ICD-10-CM | POA: Diagnosis present

## 2021-01-13 LAB — BASIC METABOLIC PANEL
Anion gap: 6 (ref 5–15)
BUN: 16 mg/dL (ref 8–23)
CO2: 24 mmol/L (ref 22–32)
Calcium: 8 mg/dL — ABNORMAL LOW (ref 8.9–10.3)
Chloride: 107 mmol/L (ref 98–111)
Creatinine, Ser: 0.9 mg/dL (ref 0.44–1.00)
GFR, Estimated: >60 mL/min (ref >60)
Glucose, Bld: 143 mg/dL — ABNORMAL HIGH (ref 70–99)
Potassium: 3.7 mmol/L (ref 3.5–5.1)
Sodium: 137 mmol/L (ref 135–145)

## 2021-01-13 LAB — GLUCOSE, CAPILLARY
Glucose-Capillary: 126 mg/dL — ABNORMAL HIGH (ref 70–99)
Glucose-Capillary: 190 mg/dL — ABNORMAL HIGH (ref 70–99)
Glucose-Capillary: 156 mg/dL — ABNORMAL HIGH (ref 70–99)
Glucose-Capillary: 145 mg/dL — ABNORMAL HIGH (ref 70–99)

## 2021-01-13 LAB — CBC
HCT: 29.1 % — ABNORMAL LOW (ref 36.0–46.0)
Hemoglobin: 9.2 g/dL — ABNORMAL LOW (ref 12.0–15.0)
MCH: 28.4 pg (ref 26.0–34.0)
MCHC: 31.6 g/dL (ref 30.0–36.0)
MCV: 89.8 fL (ref 80.0–100.0)
Platelets: 223 10*3/uL (ref 150–400)
RBC: 3.24 MIL/uL — ABNORMAL LOW (ref 3.87–5.11)
RDW: 16 % — ABNORMAL HIGH (ref 11.5–15.5)
WBC: 5.9 10*3/uL (ref 4.0–10.5)
nRBC: 0 % (ref 0.0–0.2)

## 2021-01-13 MED ORDER — SODIUM CHLORIDE 0.9 % IV BOLUS
1000.0000 mL | Freq: Once | INTRAVENOUS | Status: AC
  Administered 2021-01-13: 1000 mL via INTRAVENOUS

## 2021-01-13 MED ORDER — FE FUMARATE-B12-VIT C-FA-IFC PO CAPS
1.0000 | ORAL_CAPSULE | Freq: Two times a day (BID) | ORAL | Status: DC
  Administered 2021-01-13 – 2021-01-15 (×5): 1 via ORAL
  Filled 2021-01-13 (×7): qty 1

## 2021-01-13 NOTE — Evaluation (Signed)
Occupational Therapy Evaluation Patient Details Name: Jean Stout MRN: [id number] DOB: 1941/01/27 Today's Date: 01/13/2021   History of Present Illness Pt is 80 y.o. female s/p L TKA on 01/12/21.  PMH includes: CAD, MI, aortic stenosis, mild MV regurgitation, cardiac murmur, HTN, HLD, T2DM, CKD-III, OSAH (not complaint with nocturnal PAP therapy), GERD (on daily PPI), OA, osteopenia, anxiety, depression.   Clinical Impression   Pt seen for OT/PT co-treatment this date, POD#1 from above surgery. Pt was independent in all ADLs prior to surgery. She reports caring for her sister and completing all ADLs independently at baseline. Pt is eager to return to PLOF with less pain and improved safety and independence. Pt currently requires MAX assist for LB dressing while in seated position due to pain and limited AROM of L knee as well as MAX assist for bed/functional mobility with +2 for safety. Partial Orthostatic BP taken: 119/73 at start of session with pt sitting EOB; 96/58 standing >30 sec; 69/44 standing >/= 3 min; Pt notably pale, sweating profusely, endorsing nausea. Assisted with return to supine, RN in room to assess. Pt placed in trendelenburg position in bed, bp returns to 132/72 at end of session. RN aware . Pt would benefit from skilled OT services including additional instruction in dressing techniques with or without assistive devices for dressing and bathing skills to support recall and carryover prior to discharge and ultimately to maximize safety, independence, and minimize falls risk and caregiver burden. Recommend STR upon acute hospital DC.       Recommendations for follow up therapy are one component of a multi-disciplinary discharge planning process, led by the attending physician.  Recommendations may be updated based on patient status, additional functional criteria and insurance authorization.   Follow Up Recommendations  SNF;Supervision/Assistance - 24 hour    Equipment  Recommendations  3 in 1 bedside commode    Recommendations for Other Services       Precautions / Restrictions Precautions Precautions: Knee Precaution Booklet Issued: No Restrictions Weight Bearing Restrictions: Yes LLE Weight Bearing: Weight bearing as tolerated Other Position/Activity Restrictions: Monitor BP with mobility      Mobility Bed Mobility Overal bed mobility: Needs Assistance Bed Mobility: Sit to Supine     Supine to sit: Max assist;HOB elevated Sit to supine: Max assist;HOB elevated;+2 for safety/equipment   General bed mobility comments: Pt was able to exit bed with max assist. severely limited by pain throughout session. Does put forth good effort but required extensive assistance.    Transfers Overall transfer level: Needs assistance Equipment used: Rolling walker (2 wheeled) Transfers: Sit to/from Stand Sit to Stand: From elevated surface;Max assist;+2 safety/equipment         General transfer comment: Pt required mod assist + increased time to perform. Max vcs + mod assist to achieve EOB standing. poor eccentric control with stand> sit    Balance Overall balance assessment: Needs assistance Sitting-balance support: No upper extremity supported;Feet supported Sitting balance-Leahy Scale: Good Sitting balance - Comments: steady static sitting, reaching outside BOS.   Standing balance support: Bilateral upper extremity supported;During functional activity Standing balance-Leahy Scale: Fair Standing balance comment: Steady static standing, limited by BP, although pt denies dizziness with standing.                           ADL either performed or assessed with clinical judgement   ADL Overall ADL's : Needs assistance/impaired  General ADL Comments: Pt functionally limited by decreased AROM of her LLE, Increased pain in her LLE, decreased safety awarness, decreased awareness of  deficits, and impaired cognition. Pt also + for orthostatic BP during session which limits her participation in functional tasks. MOD A for LB ADL management, MAX A for STS t/f attempt.     Vision         Perception     Praxis      Pertinent Vitals/Pain Pain Assessment: Faces Pain Score: 7  Faces Pain Scale: Hurts little more Pain Location: L Knee Pain Descriptors / Indicators: Grimacing;Guarding Pain Intervention(s): Limited activity within patient's tolerance;Monitored during session;Premedicated before session;Repositioned;Ice applied     Hand Dominance Right   Extremity/Trunk Assessment Upper Extremity Assessment Upper Extremity Assessment: Generalized weakness   Lower Extremity Assessment Lower Extremity Assessment: LLE deficits/detail;Defer to PT evaluation LLE Deficits / Details: s/p L TKA, WBAT       Communication Communication Communication: No difficulties   Cognition Arousal/Alertness: Awake/alert Behavior During Therapy: Restless;Impulsive Overall Cognitive Status: No family/caregiver present to determine baseline cognitive functioning                                 General Comments: Pt presents with impulsivity and decreased safety awareness this PM. She requires consistent cueing for safety and re-direction to task t/o session. Hyperverbal.   General Comments  Partial Orthostatic BP taken: 119/73 at start of session with pt sitting EOB; 96/58 standing >30 sec; 69/44 standing >/= 3 min; Pt notably pale, sweating profusely, endorsing nausea. Assisted with return to supine, RN in room to assess. Pt placed in trendelenburg position in bed, bp returns to 132/72 at end of session. RN aware.    Exercises Exercises:  (will address in PM session) Other Exercises Other Exercises: Time to monitor vitals, pt educated on safety, falls prevention, and bed mobility. Education limited 2/2 cognitive status. Pt would benefit from further reinforcement.    Shoulder Instructions      Home Living Family/patient expects to be discharged to:: Private residence Living Arrangements: Other relatives Available Help at Discharge: Family;Available PRN/intermittently (Son present until Saturday, then pt is unsure if she will have help.) Type of Home: Apartment Home Access: Level entry     Home Layout: One level     Bathroom Shower/Tub: Producer, television/film/video: Standard     Home Equipment: Toilet riser;Walker - 4 wheels;Grab bars - tub/shower;Hand held shower head          Prior Functioning/Environment Level of Independence: Independent with assistive device(s)        Comments: Pt utilized cane for when outside the house. Otherwise independent for all ADL/IADL management, cares for her sister.        OT Problem List: Decreased strength;Decreased coordination;Cardiopulmonary status limiting activity;Decreased range of motion;Decreased activity tolerance;Decreased safety awareness;Impaired balance (sitting and/or standing);Decreased knowledge of use of DME or AE;Decreased knowledge of precautions      OT Treatment/Interventions: Self-care/ADL training;Therapeutic exercise;Therapeutic activities;DME and/or AE instruction;Patient/family education;Energy conservation;Balance training    OT Goals(Current goals can be found in the care plan section) Acute Rehab OT Goals Patient Stated Goal: go home however is willing to go to rehab if needed OT Goal Formulation: With patient Time For Goal Achievement: 01/27/21 Potential to Achieve Goals: Good ADL Goals Pt Will Perform Grooming: sitting;with supervision;with set-up Pt Will Perform Lower Body Dressing: with min assist;sit to/from stand Pt Will Transfer  to Toilet: ambulating;bedside commode;with supervision;with set-up  OT Frequency: Min 2X/week   Barriers to D/C: Decreased caregiver support          Co-evaluation              AM-PAC OT "6 Clicks" Daily Activity      Outcome Measure Help from another person eating meals?: None Help from another person taking care of personal grooming?: A Little Help from another person toileting, which includes using toliet, bedpan, or urinal?: A Lot Help from another person bathing (including washing, rinsing, drying)?: A Lot Help from another person to put on and taking off regular upper body clothing?: A Little Help from another person to put on and taking off regular lower body clothing?: A Lot 6 Click Score: 16   End of Session Equipment Utilized During Treatment: Gait belt;Rolling walker Nurse Communication: Other (comment) (vitals/cognition during session.)  Activity Tolerance: Other (comment) (limited by cognition/BP) Patient left: in bed;with call bell/phone within reach;with bed alarm set  OT Visit Diagnosis: Other abnormalities of gait and mobility (R26.89);Muscle weakness (generalized) (M62.81);Pain Pain - Right/Left: Left Pain - part of body: Knee;Leg                Time: 1317-1401 OT Time Calculation (min): 44 min Charges:  OT General Charges $OT Visit: 1 Visit OT Evaluation $OT Eval Moderate Complexity: 1 Mod OT Treatments $Self Care/Home Management : 8-22 mins  Rockney Ghee, M.S., OTR/L Ascom: [phone] 01/13/21, 4:07 PM

## 2021-01-13 NOTE — Progress Notes (Signed)
Physical Therapy Treatment Patient Details Name: Jean Stout MRN: [id number] DOB: 04-29-1940 Today's Date: 01/13/2021   History of Present Illness Pt is 80 y.o. female s/p L TKA on 01/12/21.  PMH includes: CAD, MI, aortic stenosis, mild MV regurgitation, cardiac murmur, HTN, HLD, T2DM, CKD-III, OSAH (not complaint with nocturnal PAP therapy), GERD (on daily PPI), OA, osteopenia, anxiety, depression.    PT Comments    PT/OT co treat for pt safety. Pt was sitting EOB attempting to put her clothes on while still having SCDs, wound vac, and polar care all in place. She is A and O but has some cognition concerns come to light throughout session. Poor safety awareness + poor problem solving abilities observed. BP while sitting EOB prior to activity 119/73. Stood with slightly improved abilities however quickly starts profusely sweating. Pain did not limit this session like the AM session did. BP upon standing 96/58. After >30 sec BP 69/44. Pt did not endorse dizziness or symptoms however continued to profusely sweat. Max assist required to return to supine in bed and reposition to [facility]. Once settled in supine BP 132/72. RN in room and aware of situation. Will inform MD/PA of concerns. Currently SNF is most appropriate for pt.   Recommendations for follow up therapy are one component of a multi-disciplinary discharge planning process, led by the attending physician.  Recommendations may be updated based on patient status, additional functional criteria and insurance authorization.  Follow Up Recommendations  SNF     Equipment Recommendations  Rolling walker with 5" wheels;3in1 (PT)    Recommendations for Other Services       Precautions / Restrictions Precautions Precautions: Knee Precaution Booklet Issued: No Precaution Comments: orthostatic hypotension Required Braces or Orthoses: Other Brace (wound vac) Restrictions Weight Bearing Restrictions: Yes LLE Weight Bearing: Weight bearing  as tolerated Other Position/Activity Restrictions: Monitor BP with mobility     Mobility  Bed Mobility Overal bed mobility: Needs Assistance Bed Mobility: Sit to Supine       Sit to supine: Max assist;HOB elevated;+2 for safety/equipment   General bed mobility comments: Pt was seated EOB upon arrival. Require max assist to return to supine and reposition to [facility]    Transfers Overall transfer level: Needs assistance Equipment used: Rolling walker (2 wheeled) Transfers: Sit to/from Stand Sit to Stand: From elevated surface;Max assist;+2 safety/equipment         General transfer comment: Pt continues to require extensive assistance to stand. +2 for safety. Overall slightly improve from AM session but continues to be a safety concern due to patient living with her sister who pt cares for.  Ambulation/Gait             General Gait Details: unable to advance away from EOB due to hypotension in standing   Stairs             Wheelchair Mobility    Modified Rankin (Stroke Patients Only)       Balance Overall balance assessment: Needs assistance Sitting-balance support: No upper extremity supported;Feet supported Sitting balance-Leahy Scale: Good Sitting balance - Comments: pt was independently sitting EOB upon arrival   Standing balance support: Bilateral upper extremity supported;During functional activity Standing balance-Leahy Scale: Fair Standing balance comment: Steady static standing, limited by BP, although pt denies dizziness with standing.                            Cognition Arousal/Alertness: Awake/alert Behavior During Therapy: Restless;Impulsive Overall  Cognitive Status: No family/caregiver present to determine baseline cognitive functioning                                 General Comments: Pt presents with impulsivity and decreased safety awareness this PM. She requires consistent cueing for safety and re-direction to  task t/o session. Hyperverbal.      Exercises Other Exercises Other Exercises: Time to monitor vitals, pt educated on safety, falls prevention, and bed mobility. Education limited 2/2 cognitive status. Pt would benefit from further reinforcement.    General Comments General comments (skin integrity, edema, etc.): Pt is A and O x4 however has some cognition concerns come to light throught sessions. Poor safety awareness overall with slow/concerning problem solving. Pt was attemptong to dress herself while wearing polar care, SCDs, and was unable to problem solve or understand why this is not safe nor possible      Pertinent Vitals/Pain Pain Assessment: 0-10 Pain Score: 4  Faces Pain Scale: Hurts a little bit Pain Location: L Knee Pain Descriptors / Indicators: Grimacing;Guarding Pain Intervention(s): Limited activity within patient's tolerance;Monitored during session;Premedicated before session;Repositioned;Ice applied    Home Living Family/patient expects to be discharged to:: Private residence Living Arrangements: Other relatives Available Help at Discharge: Family;Available PRN/intermittently (Son present until Saturday, then pt is unsure if she will have help.) Type of Home: Apartment Home Access: Level entry   Home Layout: One level Home Equipment: Toilet riser;Walker - 4 wheels;Grab bars - tub/shower;Hand held shower head      Prior Function Level of Independence: Independent with assistive device(s)      Comments: Pt utilized cane for when outside the house. Otherwise independent for all ADL/IADL management, cares for her sister.   PT Goals (current goals can now be found in the care plan section) Acute Rehab PT Goals Patient Stated Goal: go home however is willing to go to rehab if needed Progress towards PT goals: Progressing toward goals    Frequency    BID      PT Plan Current plan remains appropriate    Co-evaluation   Reason for Co-Treatment:  Complexity of the patient's impairments (multi-system involvement);To address functional/ADL transfers;For patient/therapist safety;Necessary to address cognition/behavior during functional activity          AM-PAC PT "6 Clicks" Mobility   Outcome Measure  Help needed turning from your back to your side while in a flat bed without using bedrails?: A Lot Help needed moving from lying on your back to sitting on the side of a flat bed without using bedrails?: A Lot Help needed moving to and from a bed to a chair (including a wheelchair)?: A Lot Help needed standing up from a chair using your arms (e.g., wheelchair or bedside chair)?: A Lot Help needed to walk in hospital room?: A Lot Help needed climbing 3-5 steps with a railing? : Total 6 Click Score: 11    End of Session   Activity Tolerance: Treatment limited secondary to medical complications (Comment);Other (comment) (pain did not limit this session however orthostatic hypotension did.) Patient left: in bed;with call bell/phone within reach;with bed alarm set;with family/visitor present Nurse Communication: Mobility status PT Visit Diagnosis: Unsteadiness on feet (R26.81);Other abnormalities of gait and mobility (R26.89);Muscle weakness (generalized) (M62.81);Difficulty in walking, not elsewhere classified (R26.2);Pain Pain - Right/Left: Left Pain - part of body: Knee     Time: 0117-0201 PT Time Calculation (min) (ACUTE ONLY): 44 min  Charges:  $Therapeutic Activity: 23-37 mins                     Jetta Lout PTA 01/13/21, 4:30 PM

## 2021-01-13 NOTE — Progress Notes (Signed)
Physical Therapy Treatment Patient Details Name: Jean Stout MRN: [id number] DOB: 1941-02-05 Today's Date: 01/13/2021   History of Present Illness Pt is 80 y.o. female s/p L TKA on 01/12/21.  PMH includes: CAD, MI, aortic stenosis, mild MV regurgitation, cardiac murmur, HTN, HLD, T2DM, CKD-III, OSAH (not complaint with nocturnal PAP therapy), GERD (on daily PPI), OA, osteopenia, anxiety, depression.    PT Comments    Pt was long sitting in bed with LLE on bone foam and pt's son in room. She agrees to session and was pre-medicated prior. Pt was severely limited by pain throughout session. Required max assist to exit and re-enter bed. Sat EOB x several minutes prior to standing from elevated bed height. Required mod assist to stand. Max vcs for technique and safety. Did progress to taking a few very slow antalgic steps however unable to tolerate. Returned to bed. RN/MD/CM all aware of change in recs from home to SNF. Will return in PM and continue to progress as able per current POC.    Recommendations for follow up therapy are one component of a multi-disciplinary discharge planning process, led by the attending physician.  Recommendations may be updated based on patient status, additional functional criteria and insurance authorization.  Follow Up Recommendations  SNF     Equipment Recommendations  Rolling walker with 5" wheels;3in1 (PT)       Precautions / Restrictions Precautions Precautions: Knee Precaution Booklet Issued: No Restrictions Weight Bearing Restrictions: Yes LLE Weight Bearing: Weight bearing as tolerated     Mobility  Bed Mobility Overal bed mobility: Needs Assistance Bed Mobility: Supine to Sit;Sit to Supine     Supine to sit: Max assist;HOB elevated Sit to supine: Max assist;HOB elevated   General bed mobility comments: Pt was able to exit bed with max assist. severely limited by pain throughout session. Does put forth good effort but required extensive  assistance.    Transfers Overall transfer level: Needs assistance Equipment used: Rolling walker (2 wheeled) Transfers: Sit to/from Stand Sit to Stand: Mod assist;From elevated surface         General transfer comment: Pt required mod assist + increased time to perform. Max vcs + mod assist to achieve EOB standing. poor eccentric control with stand> sit  Ambulation/Gait Ambulation/Gait assistance: Mod assist Gait Distance (Feet): 5 Feet Assistive device: Rolling walker (2 wheeled) Gait Pattern/deviations: Step-to pattern;Antalgic;Decreased stride length Gait velocity: decreased   General Gait Details: pt was able to take a few very antalgic step to steps with RW. poor wt acceptance and very unsafe technique. pt lets go of RW and needs constant vcs for safety throughout.    Balance Overall balance assessment: Needs assistance Sitting-balance support: No upper extremity supported;Feet supported Sitting balance-Leahy Scale: Good     Standing balance support: Bilateral upper extremity supported;During functional activity Standing balance-Leahy Scale: Fair       Cognition Arousal/Alertness: Awake/alert Behavior During Therapy: WFL for tasks assessed/performed Overall Cognitive Status: Within Functional Limits for tasks assessed      General Comments: Pt is A and O x 4. Severely limited by pain.             Pertinent Vitals/Pain Pain Assessment: 0-10 Pain Score: 7  Pain Location: L Knee Pain Descriptors / Indicators: Aching Pain Intervention(s): Limited activity within patient's tolerance;Monitored during session;Premedicated before session;Repositioned;Ice applied     PT Goals (current goals can now be found in the care plan section) Acute Rehab PT Goals Patient Stated Goal: go home however is  willing to go to rehab if needed Progress towards PT goals: Not progressing toward goals - comment (severely pain limited)    Frequency    BID      PT Plan  Discharge plan needs to be updated       AM-PAC PT "6 Clicks" Mobility   Outcome Measure  Help needed turning from your back to your side while in a flat bed without using bedrails?: A Lot Help needed moving from lying on your back to sitting on the side of a flat bed without using bedrails?: A Lot Help needed moving to and from a bed to a chair (including a wheelchair)?: A Lot Help needed standing up from a chair using your arms (e.g., wheelchair or bedside chair)?: A Lot Help needed to walk in hospital room?: A Lot Help needed climbing 3-5 steps with a railing? : Total 6 Click Score: 11    End of Session Equipment Utilized During Treatment: Gait belt Activity Tolerance: Patient limited by pain Patient left: in bed;with call bell/phone within reach;with bed alarm set;with family/visitor present Nurse Communication: Mobility status PT Visit Diagnosis: Unsteadiness on feet (R26.81);Other abnormalities of gait and mobility (R26.89);Muscle weakness (generalized) (M62.81);Difficulty in walking, not elsewhere classified (R26.2);Pain Pain - Right/Left: Left Pain - part of body: Knee     Time: 2993-7169 PT Time Calculation (min) (ACUTE ONLY): 34 min  Charges:  $Therapeutic Activity: 23-37 mins                    Jetta Lout PTA 01/13/21, 12:27 PM

## 2021-01-13 NOTE — Progress Notes (Signed)
Subjective: 1 Day Post-Op Procedure(s) (LRB): TOTAL KNEE ARTHROPLASTY (Left) Patient reports pain as moderate and severe.   Patient is well, and has had no acute complaints or problems Denies any CP, SOB, ABD pain. We will continue therapy today.  Plan is to go Home after hospital stay.  Objective: Vital signs in last 24 hours: Temp:  [97.4 F (36.3 C)-98.4 F (36.9 C)] 98.2 F (36.8 C) (10/18 2039) Pulse Rate:  [60-73] 73 (10/18 2039) Resp:  [14-20] 16 (10/18 2039) BP: (89-155)/(52-122) 148/61 (10/18 2039) SpO2:  [91 %-100 %] 91 % (10/18 2039)  Intake/Output from previous day: 10/18 0701 - 10/19 0700 In: 2668.7 [P.O.:120; I.V.:2348.7; IV Piggyback:200] Out: 660 [Urine:635; Blood:25] Intake/Output this shift: No intake/output data recorded.  Recent Labs    01/12/21 1119 01/13/21 0330  HGB 10.4* 9.2*   Recent Labs    01/12/21 1119 01/13/21 0330  WBC 6.5 5.9  RBC 3.59* 3.24*  HCT 32.0* 29.1*  PLT 247 223   Recent Labs    01/12/21 1119 01/13/21 0330  NA  --  137  K  --  3.7  CL  --  107  CO2  --  24  BUN  --  16  CREATININE 1.08* 0.90  GLUCOSE  --  143*  CALCIUM  --  8.0*   No results for input(s): LABPT, INR in the last 72 hours.  EXAM General - Patient is Alert, Appropriate, and Oriented Extremity - Neurovascular intact Sensation intact distally Intact pulses distally Dorsiflexion/Plantar flexion intact Dressing - dressing C/D/I and no drainage, Prevena intact without drainage Motor Function - intact, moving foot and toes well on exam.   Past Medical History:  Diagnosis Date   Anxiety and depression    Aortic stenosis    Arthritis    Cardiac murmur    a.) Grade I/VI medium pitched mid systolic blowing at lower LSB   CKD (chronic kidney disease), stage III (HCC)    Coronary artery disease    GERD (gastroesophageal reflux disease)    History of 2019 novel coronavirus disease (COVID-19) 12/29/2020   a.) aymptomatic; PCR (+) on 12/29/2020  with confirmatory (+) rapid PCR on 12/30/2020.   History of MI (myocardial infarction) 2010   HLD (hyperlipidemia)    HTN (hypertension)    IDA (iron deficiency anemia)    Mild mitral regurgitation    Myocardial infarction [facility]) 2010   a.) Tx'd at [facility] [place]; PCI with stents x 2 (unknown type and location).   OSA on CPAP    a.) not complaint with prescribed nocturnal PAP therapy   Osteopenia    PONV (postoperative nausea and vomiting)    T2DM (type 2 diabetes mellitus) (HCC)    Vaginal prolapse     Assessment/Plan:   1 Day Post-Op Procedure(s) (LRB): TOTAL KNEE ARTHROPLASTY (Left) Active Problems:   S/P TKR (total knee replacement) using cement, left  Estimated body mass index is 31.64 kg/m as calculated from the following:   Height as of this encounter: 5\' 2"  (1.575 m).   Weight as of this encounter: 78.5 kg. Advance diet Up with therapy Work on bowel movement Pain moderate to severe.  Continue with current pain regimen.  We will continue to monitor Acute postop blood loss anemia -hemoglobin 9.2.*Iron supplement Vital signs are stable Care management to assist with discharge to home with home health PT   DVT Prophylaxis - Lovenox, Foot Pumps, and TED hose Weight-Bearing as tolerated to left leg   T.  Herminio Commons [facility] 01/13/2021, 8:00 AM

## 2021-01-13 NOTE — Progress Notes (Signed)
Patient alert and oriented x 4, complains of pain to left surgical knee. Frequent request for IV dilaudid to help her sleep. Patient educated on use of pain medications and parameters nursing staff must follow. She continued to request IV dilaudid, encouraged PO medications for management. Foley removed. Bone foam stabilized the extremity, polar care and wound vac intact. Vitals stable, will continue to monitor.

## 2021-01-13 NOTE — Progress Notes (Signed)
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

## 2021-01-13 NOTE — Anesthesia Postprocedure Evaluation (Signed)
Anesthesia Post Note  Patient: Jean Stout  Procedure(s) Performed: TOTAL KNEE ARTHROPLASTY (Left: Knee)  Patient location during evaluation: Nursing Unit Anesthesia Type: Spinal Level of consciousness: oriented and awake and alert Pain management: pain level controlled Vital Signs Assessment: post-procedure vital signs reviewed and stable Respiratory status: spontaneous breathing and respiratory function stable Cardiovascular status: blood pressure returned to baseline and stable Postop Assessment: no headache, no backache, no apparent nausea or vomiting and patient able to bend at knees Anesthetic complications: no   No notable events documented.   Last Vitals:  Vitals:   01/12/21 2039 01/13/21 0800  BP: (!) 148/61 140/72  Pulse: 73 62  Resp: 16 18  Temp: 36.8 C 37.6 C  SpO2: 91% 93%    Last Pain:  Vitals:   01/13/21 0825  TempSrc:   PainSc: 5                  Starling Manns

## 2021-01-14 LAB — CBC
HCT: 25.7 % — ABNORMAL LOW (ref 36.0–46.0)
Hemoglobin: 8.7 g/dL — ABNORMAL LOW (ref 12.0–15.0)
MCH: 30.6 pg (ref 26.0–34.0)
MCHC: 33.9 g/dL (ref 30.0–36.0)
MCV: 90.5 fL (ref 80.0–100.0)
Platelets: 181 10*3/uL (ref 150–400)
RBC: 2.84 MIL/uL — ABNORMAL LOW (ref 3.87–5.11)
RDW: 16 % — ABNORMAL HIGH (ref 11.5–15.5)
WBC: 8.2 10*3/uL (ref 4.0–10.5)
nRBC: 0 % (ref 0.0–0.2)

## 2021-01-14 LAB — URINALYSIS, COMPLETE (UACMP) WITH MICROSCOPIC
Bacteria, UA: NONE SEEN
Bilirubin Urine: NEGATIVE
Glucose, UA: NEGATIVE mg/dL
Hgb urine dipstick: NEGATIVE
Ketones, ur: 5 mg/dL — AB
Leukocytes,Ua: NEGATIVE
Nitrite: NEGATIVE
Protein, ur: 30 mg/dL — AB
Specific Gravity, Urine: 1.031 — ABNORMAL HIGH (ref 1.005–1.030)
pH: 5 (ref 5.0–8.0)

## 2021-01-14 LAB — GLUCOSE, CAPILLARY
Glucose-Capillary: 122 mg/dL — ABNORMAL HIGH (ref 70–99)
Glucose-Capillary: 123 mg/dL — ABNORMAL HIGH (ref 70–99)
Glucose-Capillary: 161 mg/dL — ABNORMAL HIGH (ref 70–99)
Glucose-Capillary: 110 mg/dL — ABNORMAL HIGH (ref 70–99)

## 2021-01-14 MED ORDER — SODIUM CHLORIDE 0.9 % IV BOLUS
500.0000 mL | Freq: Once | INTRAVENOUS | Status: AC
  Administered 2021-01-14: 500 mL via INTRAVENOUS

## 2021-01-14 MED ORDER — ACETAMINOPHEN 325 MG PO TABS
650.0000 mg | ORAL_TABLET | Freq: Four times a day (QID) | ORAL | Status: DC
  Administered 2021-01-14 – 2021-01-15 (×5): 650 mg via ORAL
  Filled 2021-01-14 (×5): qty 2

## 2021-01-14 MED ORDER — MAGNESIUM HYDROXIDE 400 MG/5ML PO SUSP
30.0000 mL | Freq: Once | ORAL | Status: DC
  Filled 2021-01-14: qty 30

## 2021-01-14 MED ORDER — BISACODYL 10 MG RE SUPP
10.0000 mg | Freq: Once | RECTAL | Status: DC
  Filled 2021-01-14: qty 1

## 2021-01-14 NOTE — Progress Notes (Signed)
Physical Therapy Treatment Patient Details Name: Manjot Beumer MRN: [id number] DOB: 03-02-1941 Today's Date: 01/14/2021   History of Present Illness Pt is 80 y.o. female s/p L TKA on 01/12/21.  PMH includes: CAD, MI, aortic stenosis, mild MV regurgitation, cardiac murmur, HTN, HLD, T2DM, CKD-III, OSAH (not complaint with nocturnal PAP therapy), GERD (on daily PPI), OA, osteopenia, anxiety, depression.    PT Comments    Pt was very fatigued from days events upon arriving but agreeable to session. This session focused on strengthening and ROM improvements. Pt was able to advance ROM- knee flex to 92 degrees with only lacking ~ 4 degrees extension. Pt has been approved for STR and will be DC is medically stable to [facility] to continue skilled PT while assisting pt to PLOF.    Recommendations for follow up therapy are one component of a multi-disciplinary discharge planning process, led by the attending physician.  Recommendations may be updated based on patient status, additional functional criteria and insurance authorization.  Follow Up Recommendations  SNF     Equipment Recommendations  Rolling walker with 5" wheels;3in1 (PT)    Recommendations for Other Services       Precautions / Restrictions Precautions Precautions: Knee Precaution Booklet Issued: Yes (comment) Precaution Comments: orthostatic hypotension Restrictions Weight Bearing Restrictions: Yes LLE Weight Bearing: Weight bearing as tolerated     Mobility  Bed Mobility Overal bed mobility: Needs Assistance Bed Mobility: Supine to Sit     Supine to sit: Mod assist;HOB elevated Sit to supine: Max assist    Transfers    General transfer comment: pt elected not to perform OOB activity. this session focused on improving ROM and strength  Ambulation/Gait  General Gait Details: pt ambulated earlier with RN staff to BR. very slow antalgic pattern. BM on floor too form BR per RN      Balance Overall  balance assessment: Needs assistance Sitting-balance support: No upper extremity supported;Feet supported Sitting balance-Leahy Scale: Good     Standing balance support: Bilateral upper extremity supported;During functional activity Standing balance-Leahy Scale: Fair       Cognition Arousal/Alertness: Awake/alert Behavior During Therapy: WFL for tasks assessed/performed Overall Cognitive Status: Impaired/Different from baseline Area of Impairment: Orientation;Memory;Safety/judgement;Following commands;Problem solving      Orientation Level: Time;Place Current Attention Level: Alternating;Selective;Divided Memory: Decreased recall of precautions;Decreased short-term memory Following Commands: Follows one step commands inconsistently Safety/Judgement: Decreased awareness of safety     General Comments: Pt continues to not be at baseline cognition per son. UA being performed             Pertinent Vitals/Pain Pain Assessment: 0-10 Pain Score: 4  Pain Location: L Knee Pain Descriptors / Indicators: Grimacing;Guarding Pain Intervention(s): Limited activity within patient's tolerance;Monitored during session;Premedicated before session;Repositioned;Ice applied     PT Goals (current goals can now be found in the care plan section) Acute Rehab PT Goals Patient Stated Goal: rehab then home\ Progress towards PT goals: Progressing toward goals (slowed progress due to cognition, BP concerns, and pain)    Frequency    BID      PT Plan Current plan remains appropriate       AM-PAC PT "6 Clicks" Mobility   Outcome Measure  Help needed turning from your back to your side while in a flat bed without using bedrails?: A Lot Help needed moving from lying on your back to sitting on the side of a flat bed without using bedrails?: A Lot Help needed moving to and from a  bed to a chair (including a wheelchair)?: A Lot Help needed standing up from a chair using your arms (e.g.,  wheelchair or bedside chair)?: A Lot Help needed to walk in hospital room?: A Lot Help needed climbing 3-5 steps with a railing? : Total 6 Click Score: 11    End of Session   Activity Tolerance: Patient tolerated treatment well Patient left: in bed;with call bell/phone within reach;with bed alarm set;with family/visitor present Nurse Communication: Mobility status PT Visit Diagnosis: Unsteadiness on feet (R26.81);Other abnormalities of gait and mobility (R26.89);Muscle weakness (generalized) (M62.81);Difficulty in walking, not elsewhere classified (R26.2);Pain Pain - Right/Left: Left Pain - part of body: Knee     Time: 1550-1616 PT Time Calculation (min) (ACUTE ONLY): 26 min  Charges:  $Therapeutic Exercise: 8-22 mins $Therapeutic Activity: 8-22 mins                     Jetta Lout PTA 01/14/21, 4:55 PM

## 2021-01-14 NOTE — Progress Notes (Signed)
Subjective: 2 Days Post-Op Procedure(s) (LRB): TOTAL KNEE ARTHROPLASTY (Left) Patient reports pain as mild.  Yesterday afternoon and this morning patient noted to have significant hypertension with standing.  This morning noted to have some confusion. Patient is well, and has had no acute complaints or problems Denies any CP, SOB, ABD pain.  No nausea or vomiting.  No headaches. We will continue therapy today.  Plan is to go Home after hospital stay. Son is at bedside.  Objective: Vital signs in last 24 hours: Temp:  [97.7 F (36.5 C)-98.9 F (37.2 C)] 98.9 F (37.2 C) (10/20 0433) Pulse Rate:  [69-81] 81 (10/20 0433) Resp:  [16-19] 19 (10/20 0433) BP: (110-129)/(51-59) 113/55 (10/20 0433) SpO2:  [92 %-95 %] 92 % (10/20 0433)  Intake/Output from previous day: 10/19 0701 - 10/20 0700 In: 240 [P.O.:240] Out: -  Intake/Output this shift: Total I/O In: 522.2 [IV Piggyback:522.2] Out: -   Recent Labs    01/12/21 1119 01/13/21 0330 01/14/21 0707  HGB 10.4* 9.2* 8.7*   Recent Labs    01/13/21 0330 01/14/21 0707  WBC 5.9 8.2  RBC 3.24* 2.84*  HCT 29.1* 25.7*  PLT 223 181   Recent Labs    01/12/21 1119 01/13/21 0330  NA  --  137  K  --  3.7  CL  --  107  CO2  --  24  BUN  --  16  CREATININE 1.08* 0.90  GLUCOSE  --  143*  CALCIUM  --  8.0*   No results for input(s): LABPT, INR in the last 72 hours.  EXAM General - Patient is Alert, Appropriate, and Oriented.   Extremity - Neurovascular intact Sensation intact distally Intact pulses distally Dorsiflexion/Plantar flexion intact Negative Homans' sign bilaterally Dressing - dressing C/D/I and no drainage, Prevena intact without drainage Motor Function - intact, moving foot and toes well on exam.   Past Medical History:  Diagnosis Date   Anxiety and depression    Aortic stenosis    Arthritis    Cardiac murmur    a.) Grade I/VI medium pitched mid systolic blowing at lower LSB   CKD (chronic kidney  disease), stage III (HCC)    Coronary artery disease    GERD (gastroesophageal reflux disease)    History of 2019 novel coronavirus disease (COVID-19) 12/29/2020   a.) aymptomatic; PCR (+) on 12/29/2020 with confirmatory (+) rapid PCR on 12/30/2020.   History of MI (myocardial infarction) 2010   HLD (hyperlipidemia)    HTN (hypertension)    IDA (iron deficiency anemia)    Mild mitral regurgitation    Myocardial infarction [facility]) 2010   a.) Tx'd at [facility] [place]; PCI with stents x 2 (unknown type and location).   OSA on CPAP    a.) not complaint with prescribed nocturnal PAP therapy   Osteopenia    PONV (postoperative nausea and vomiting)    T2DM (type 2 diabetes mellitus) (HCC)    Vaginal prolapse     Assessment/Plan:   2 Days Post-Op Procedure(s) (LRB): TOTAL KNEE ARTHROPLASTY (Left) Active Problems:   S/P TKR (total knee replacement) using cement, left  Estimated body mass index is 31.64 kg/m as calculated from the following:   Height as of this encounter: 5\' 2"  (1.575 m).   Weight as of this encounter: 78.5 kg. Advance diet Up with therapy Work on bowel movement  Pain improved but patient noted to have some confusion along with hypertension.  We will hold blood pressure medications and  cut back on narcotics.  We will DC tramadol and decrease oxycodone from 15 mg to 5 to 10 mg as needed.  We will place patient on scheduled Tylenol.  Will order urinalysis to rule out any type of infectious process.  Acute postop blood loss anemia -hemoglobin 8.7, stable.  Continue with iron supplement  Vital signs are stable.  Continue with gentle IV fluid hydration.  Care management to assist with discharge to home with home health PT  DVT Prophylaxis - Lovenox, Foot Pumps, and TED hose Weight-Bearing as tolerated to left leg   T. Cranston Neighbor, PA-C [facility] 01/14/2021, 9:08 AM

## 2021-01-14 NOTE — TOC Progression Note (Signed)
Transition of Care [facility]) - Progression Note    Patient Details  Name: Jean Stout MRN: [id number] Date of Birth: 12/08/1940  Transition of Care [facility]) CM/SW Contact  Marlowe Sax, RN Phone Number: 01/14/2021, 4:05 PM  Clinical Narrative:     [facility] has made the bed offer, Patient requested [employer] and will accept the bed offer, Will plan to DC tomorrow  Expected Discharge Plan: Home w Home Health Services Barriers to Discharge: Continued Medical Work up  Expected Discharge Plan and Services Expected Discharge Plan: Home w Home Health Services   Discharge Planning Services: CM Consult   Living arrangements for the past 2 months: Apartment (her sisiter with mild dementia lives with her and she is the sisiter's caregiver, the sister is now in STR at [facility])                 DME Arranged: Dan Humphreys rolling DME Agency: [employer] Date DME Agency Contacted: 01/13/21 Time DME Agency Contacted: 1051 Representative spoke with at DME Agency: Bjorn Loser HH Arranged: PT HH Agency: [facility] Date [facility] Agency Contacted: 01/13/21 Time HH Agency Contacted: 1052 Representative spoke with at [facility] Agency: [place]   Social Determinants of Health (SDOH) Interventions    Readmission Risk Interventions No flowsheet data found.

## 2021-01-14 NOTE — Progress Notes (Signed)
Patient alert and drowsy with assessment. No complaints of pain during shift. She completed 1 liter bolus and denies urge to void. Bladder scan performed with <200 noted to bladder. Vitals stable, no respiratory distress on room air. Will continue to monitor.

## 2021-01-14 NOTE — NC FL2 (Signed)
[place] MEDICAID [id number] LEVEL OF CARE SCREENING TOOL     IDENTIFICATION  Patient Name: Jean Stout Birthdate: [birth year] Sex: female Admission Date (Current Location): 01/12/2021  [facility] and [place] Number:  Chiropodist and Address:  [facility], [address], [place], [place] [address]      Provider Number: [id number]  Attending Physician Name and Address:  Kennedy Bucker, MD  Relative Name and Phone Number:  Carlean Jews [phone]    Current Level of Care: Hospital Recommended Level of Care: Skilled Nursing Facility Prior Approval Number:    Date Approved/Denied:   PASRR Number: [id number]  Discharge Plan: SNF    Current Diagnoses: Patient Active Problem List   Diagnosis Date Noted   S/P TKR (total knee replacement) using cement, left 01/12/2021   Bilateral primary osteoarthritis of knee 05/19/2020   Secondary hyperparathyroidism of renal origin (HCC) 09/11/2019   Anemia in chronic kidney disease 08/02/2019   Benign hypertensive kidney disease with chronic kidney disease 08/02/2019   Stage 3a chronic kidney disease (HCC) 08/02/2019   MDD (major depressive disorder), recurrent episode, moderate (HCC) 11/02/2018   GAD (generalized anxiety disorder) 11/02/2018   Panic attacks 11/02/2018   Insomnia due to mental condition 11/02/2018   Primary osteoarthritis of left knee 10/09/2018   Localized primary osteoarthritis of shoulder regions, bilateral 10/09/2018   Right wrist pain 10/09/2018   Chronic pain of right ankle 10/09/2018   Chronic pain syndrome 10/09/2018   Polyarthralgia 10/09/2018   History of bilateral hip replacements 10/09/2018   Aortic stenosis, moderate 03/13/2018   Coronary artery disease involving native coronary artery of native heart without angina pectoris 03/01/2018   Depression 03/01/2018   Essential hypertension 03/01/2018   GERD without esophagitis 03/01/2018   Hyperlipidemia, mixed 03/01/2018   Iron  deficiency anemia 03/01/2018   OSA on CPAP 03/01/2018   Type 2 diabetes mellitus without complication, without long-term current use of insulin (HCC) 03/01/2018   Osteoarthritis of multiple joints 03/01/2018    Orientation RESPIRATION BLADDER Height & Weight     Self, Situation, Place  Normal Continent, External catheter Weight: 78.5 kg Height:  5\' 2"  (157.5 cm)  BEHAVIORAL SYMPTOMS/MOOD NEUROLOGICAL BOWEL NUTRITION STATUS      Continent Diet (carb modified)  AMBULATORY STATUS COMMUNICATION OF NEEDS Skin     Verbally Normal, Surgical wounds                       Personal Care Assistance Level of Assistance              Functional Limitations Info             SPECIAL CARE FACTORS FREQUENCY                       Contractures Contractures Info: Not present    Additional Factors Info  Code Status, Allergies Code Status Info: full code Allergies Info: Bextra, Iodine diagnositc agents, Gadolinium           Current Medications (01/14/2021):  This is the current hospital active medication list Current Facility-Administered Medications  Medication Dose Route Frequency Provider Last Rate Last Admin   0.9 %  sodium chloride infusion   Intravenous Continuous 01/16/2021, MD 100 mL/hr at 01/14/21 0912 New Bag at 01/14/21 0912   acetaminophen (TYLENOL) tablet 650 mg  650 mg Oral Q6H 01/16/21, PA-C   650 mg at 01/14/21 1257   alum & mag hydroxide-simeth (MAALOX/MYLANTA)  200-200-20 MG/5ML suspension 30 mL  30 mL Oral Q4H PRN Kennedy Bucker, MD       amLODipine (NORVASC) tablet 5 mg  5 mg Oral Daily Kennedy Bucker, MD   5 mg at 01/13/21 0901   aspirin EC tablet 81 mg  81 mg Oral Daily Kennedy Bucker, MD   81 mg at 01/14/21 3704   atorvastatin (LIPITOR) tablet 40 mg  40 mg Oral QHS Kennedy Bucker, MD   40 mg at 01/13/21 2308   bisacodyl (DULCOLAX) suppository 10 mg  10 mg Rectal Daily PRN Kennedy Bucker, MD       bisacodyl (DULCOLAX) suppository 10 mg  10 mg  Rectal Once Evon Slack, PA-C       diphenhydrAMINE (BENADRYL) 12.5 MG/5ML elixir 12.5-25 mg  12.5-25 mg Oral Q4H PRN Kennedy Bucker, MD       docusate sodium (COLACE) capsule 100 mg  100 mg Oral BID Kennedy Bucker, MD   100 mg at 01/14/21 8889   DULoxetine (CYMBALTA) DR capsule 120 mg  120 mg Oral Daily Kennedy Bucker, MD   120 mg at 01/14/21 0835   enoxaparin (LOVENOX) injection 30 mg  30 mg Subcutaneous Q12H Kennedy Bucker, MD   30 mg at 01/14/21 0835   famotidine (PEPCID) tablet 40 mg  40 mg Oral QHS Kennedy Bucker, MD   40 mg at 01/13/21 2308   ferrous fumarate-b12-vitamic C-folic acid (TRINSICON / FOLTRIN) capsule 1 capsule  1 capsule Oral BID PC Kennedy Bucker, MD   1 capsule at 01/14/21 1694   hydrALAZINE (APRESOLINE) tablet 50 mg  50 mg Oral TID Kennedy Bucker, MD   50 mg at 01/13/21 2308   HYDROmorphone (DILAUDID) injection 0.5-1 mg  0.5-1 mg Intravenous Q4H PRN Kennedy Bucker, MD   1 mg at 01/13/21 0146   insulin aspart (novoLOG) injection 0-15 Units  0-15 Units Subcutaneous TID WC Kennedy Bucker, MD   3 Units at 01/13/21 1703   lisinopril (ZESTRIL) tablet 40 mg  40 mg Oral Daily Kennedy Bucker, MD   40 mg at 01/13/21 0901   magnesium hydroxide (MILK OF MAGNESIA) suspension 30 mL  30 mL Oral QHS Kennedy Bucker, MD   30 mL at 01/13/21 2309   magnesium hydroxide (MILK OF MAGNESIA) suspension 30 mL  30 mL Oral Once Amador Cunas C, PA-C       magnesium oxide (MAG-OX) tablet 400 mg  400 mg Oral Daily Kennedy Bucker, MD   400 mg at 01/14/21 5038   menthol-cetylpyridinium (CEPACOL) lozenge 3 mg  1 lozenge Oral PRN Kennedy Bucker, MD       Or   phenol (CHLORASEPTIC) mouth spray 1 spray  1 spray Mouth/Throat PRN Kennedy Bucker, MD       metFORMIN (GLUCOPHAGE-XR) 24 hr tablet 500 mg  500 mg Oral Q breakfast Kennedy Bucker, MD   500 mg at 01/14/21 8828   methocarbamol (ROBAXIN) tablet 500 mg  500 mg Oral Q6H PRN Kennedy Bucker, MD   500 mg at 01/13/21 1248   Or   methocarbamol (ROBAXIN) 500 mg in dextrose  5 % 50 mL IVPB  500 mg Intravenous Q6H PRN Kennedy Bucker, MD       multivitamin with minerals tablet 1 tablet  1 tablet Oral Daily Kennedy Bucker, MD   1 tablet at 01/14/21 0834   ondansetron (ZOFRAN) tablet 4 mg  4 mg Oral Q6H PRN Kennedy Bucker, MD   4 mg at 01/13/21 1321   Or   ondansetron (ZOFRAN)  injection 4 mg  4 mg Intravenous Q6H PRN Kennedy Bucker, MD       oxyCODONE (Oxy IR/ROXICODONE) immediate release tablet 5-10 mg  5-10 mg Oral Q4H PRN Kennedy Bucker, MD       pantoprazole (PROTONIX) EC tablet 80 mg  80 mg Oral Daily Kennedy Bucker, MD   80 mg at 01/14/21 0835   polyethylene glycol (MIRALAX / GLYCOLAX) packet 17 g  17 g Oral Daily PRN Kennedy Bucker, MD       sodium phosphate (FLEET) 7-19 GM/118ML enema 1 enema  1 enema Rectal Once PRN Kennedy Bucker, MD       zolpidem Remus Loffler) tablet 5 mg  5 mg Oral QHS PRN Kennedy Bucker, MD   5 mg at 01/12/21 2318     Discharge Medications: Please see discharge summary for a list of discharge medications.  Relevant Imaging Results:  Relevant Lab Results:   Additional Information SS# [id number]  Marlowe Sax, RN

## 2021-01-14 NOTE — Progress Notes (Signed)
Occupational Therapy Treatment Patient Details Name: Jean Stout MRN: [id number] DOB: 10-20-1940 Today's Date: 01/14/2021   History of present illness Pt is 80 y.o. female s/p L TKA on 01/12/21.  PMH includes: CAD, MI, aortic stenosis, mild MV regurgitation, cardiac murmur, HTN, HLD, T2DM, CKD-III, OSAH (not complaint with nocturnal PAP therapy), GERD (on daily PPI), OA, osteopenia, anxiety, depression.   OT comments  [name] seen for OT treatment on this date. Upon arrival to room pt awake/alert. Pt seated upright in bed with family present for end of session. Pt A&O to name only. Pt agreeable to tx. Pt MOD A x2 + RW sit<>stand. Pt MIN A + RW + MAX multimodal cuing for initiation, technique, and motor planning during functional t/f, bed to chair. Pt asymptomatic for orthostatics, denying dizziness. BP 130/53 in chair. Pt confused re: time, place, dietary intake, polar care. Son concerned about d/c planning 2/2 mother's cognitive state. Pt progressing toward goals. Pt continues to benefit from skilled OT services to maximize return to PLOF and minimize risk of future falls, injury, caregiver burden, and readmission. Will continue to follow POC. Discharge recommendation remains appropriate.     Recommendations for follow up therapy are one component of a multi-disciplinary discharge planning process, led by the attending physician.  Recommendations may be updated based on patient status, additional functional criteria and insurance authorization.    Follow Up Recommendations  SNF;Supervision/Assistance - 24 hour    Equipment Recommendations  3 in 1 bedside commode    Recommendations for Other Services      Precautions / Restrictions Precautions Precautions: Knee Precaution Booklet Issued: No Precaution Comments: orthostatic hypotension Required Braces or Orthoses:  (wound vac) Restrictions Weight Bearing Restrictions: Yes LLE Weight Bearing: Weight bearing as tolerated Other  Position/Activity Restrictions: Monitor BP with mobility       Mobility Bed Mobility Overal bed mobility: Needs Assistance Bed Mobility: Supine to Sit     Supine to sit: Mod assist;HOB elevated Sit to supine: Max assist   General bed mobility comments: PT BP in supine 112/61. min-mod assist of one to progress to EOB. stood 2 x EOB prior to requiring max assist to return and reposition in bed.    Transfers Overall transfer level: Needs assistance Equipment used: Rolling walker (2 wheeled) Transfers: Sit to/from Stand Sit to Stand: Mod assist;+2 physical assistance         General transfer comment: pt stood 2 x EOB but has orthostatic hypotension symptoms. unable to keep eyes open and endorse fatigue quickly. BP in standing on trial one 88/38. recovers to 113/52 prior to standing a 2nd time BP 2nd time 106/48    Balance Overall balance assessment: Needs assistance Sitting-balance support: No upper extremity supported;Feet supported Sitting balance-Leahy Scale: Good     Standing balance support: Bilateral upper extremity supported;During functional activity Standing balance-Leahy Scale: Fair Standing balance comment: Steady static standing, limited by BP. stood x ~ 1 minute each time                           ADL either performed or assessed with clinical judgement   ADL                                               Vision       Perception  Praxis      Cognition Arousal/Alertness: Awake/alert Behavior During Therapy: WFL for tasks assessed/performed Overall Cognitive Status: Impaired/Different from baseline Area of Impairment: Orientation;Memory;Safety/judgement;Following commands;Problem solving                 Orientation Level: Time;Place Current Attention Level: Alternating;Selective;Divided Memory: Decreased recall of precautions;Decreased short-term memory Following Commands: Follows one step commands  inconsistently Safety/Judgement: Decreased awareness of safety   Problem Solving: Decreased initiation;Difficulty sequencing;Requires verbal cues;Requires tactile cues General Comments: Pt confused throughout session.        Exercises Exercises: Other exercises Other Exercises Other Exercises: Pt educated re: OT role, falls pcns, polar care, knee pcns Other Exercises: Sup>sit, sit<>stand   Shoulder Instructions       General Comments pt cognition continues to be concerning. BP concerns limited pt progression. Pain was not an issue this session.    Pertinent Vitals/ Pain       Pain Assessment: 0-10 Pain Score: 2  Faces Pain Scale: Hurts a little bit Pain Location: L Knee Pain Descriptors / Indicators: Grimacing;Guarding Pain Intervention(s): Limited activity within patient's tolerance;Repositioned  Home Living                                          Prior Functioning/Environment              Frequency  Min 2X/week        Progress Toward Goals  OT Goals(current goals can now be found in the care plan section)  Progress towards OT goals: Progressing toward goals  Acute Rehab OT Goals Patient Stated Goal: go home and see my dog OT Goal Formulation: With patient Time For Goal Achievement: 01/27/21 Potential to Achieve Goals: Good ADL Goals Pt Will Perform Grooming: sitting;with supervision;with set-up Pt Will Perform Lower Body Dressing: with min assist;sit to/from stand Pt Will Transfer to Toilet: ambulating;bedside commode;with supervision;with set-up  Plan Discharge plan remains appropriate    Co-evaluation                 AM-PAC OT "6 Clicks" Daily Activity     Outcome Measure   Help from another person eating meals?: None Help from another person taking care of personal grooming?: A Little Help from another person toileting, which includes using toliet, bedpan, or urinal?: A Lot Help from another person bathing (including  washing, rinsing, drying)?: A Lot Help from another person to put on and taking off regular upper body clothing?: A Little Help from another person to put on and taking off regular lower body clothing?: A Lot 6 Click Score: 16    End of Session Equipment Utilized During Treatment: Gait belt;Rolling walker  OT Visit Diagnosis: Other abnormalities of gait and mobility (R26.89);Muscle weakness (generalized) (M62.81);Pain Pain - Right/Left: Left Pain - part of body: Knee;Leg   Activity Tolerance Patient tolerated treatment well   Patient Left in chair;with call bell/phone within reach;with chair alarm set;with family/visitor present   Nurse Communication Mobility status (Notified of BP, cognition)        Time: 0626-9485 OT Time Calculation (min): 36 min  Charges: OT General Charges $OT Visit: 1 Visit OT Treatments $Therapeutic Activity: 23-37 mins  [place] [facility], Adine Madura 01/14/2021, 12:18 PM

## 2021-01-14 NOTE — TOC Progression Note (Signed)
Transition of Care [facility]) - Progression Note    Patient Details  Name: Jean Stout MRN: [id number] Date of Birth: [birth year]  Transition of Care [facility] CM/SW [place], RN Phone Number: 01/14/2021, 2:08 PM  Clinical Narrative:    Met with the patient and her daughter oin law in the room, discussed the need to go to STR SNF< she is agreeable and would like to go to [facility], Estate manager/land agent sent, [facility] completed   Expected Discharge Plan: [place] Barriers to Discharge: Continued Medical Work up  Expected Discharge Plan and Services Expected Discharge Plan: [place]   Discharge Planning Services: CM Consult   Living arrangements for the past 2 months: Apartment (her sisiter with mild dementia lives with her and she is the sisiter's caregiver, the sister is now in [place] at [facility])                 DME Arranged: Gilford Rile rolling DME Agency: [employer] Date DME Agency Contacted: 01/13/21 Time DME Agency Contacted: 1051 Representative spoke with at DME Agency: [place]: PT [place]: [place] Date [place]: 01/13/21 Time [place]: 1052 Representative spoke with at [place]: [place]   Social Determinants of Health ([place]) Interventions    Readmission Risk Interventions No flowsheet data found.

## 2021-01-14 NOTE — Progress Notes (Signed)
Physical Therapy Treatment Patient Details Name: Jean Stout MRN: [id number] DOB: 01/22/1941 Today's Date: 01/14/2021   History of Present Illness Pt is 80 y.o. female s/p L TKA on 01/12/21.  PMH includes: CAD, MI, aortic stenosis, mild MV regurgitation, cardiac murmur, HTN, HLD, T2DM, CKD-III, OSAH (not complaint with nocturnal PAP therapy), GERD (on daily PPI), OA, osteopenia, anxiety, depression.    PT Comments    Pt was awake but lethargic this AM. Supportive son present and able to confirm that pt's cognition is not at baseline. Pt was agreeable to session and cooperative throughout. Pain did not limit session progression however BP concerns did. BP in supine prior to OOB activity.Spine;BP 112/61 Sitting; 108/53 standing 88/38 recovers to 113/52 prior to standing again with drop to 106/48. MAP dropped below 65 both standing attempts. She gets very lethargic during standing trials with decrease alertness/ endorses fatigue. Son is concerned with pt's progress. PT recommends SNF at [place] for safety. Will need extensive PT going forward to progress pt to PLOF.     Recommendations for follow up therapy are one component of a multi-disciplinary discharge planning process, led by the attending physician.  Recommendations may be updated based on patient status, additional functional criteria and insurance authorization.  Follow Up Recommendations  SNF     Equipment Recommendations  Rolling walker with 5" wheels;3in1 (PT)       Precautions / Restrictions Precautions Precautions: Knee Precaution Booklet Issued: No Precaution Comments: orthostatic hypotension Required Braces or Orthoses:  (wound vac) Restrictions Weight Bearing Restrictions: Yes LLE Weight Bearing: Weight bearing as tolerated Other Position/Activity Restrictions: Monitor BP with mobility     Mobility  Bed Mobility Overal bed mobility: Needs Assistance Bed Mobility: Supine to Sit;Sit to Supine     Supine to sit: Min  assist;Mod assist Sit to supine: Max assist   General bed mobility comments: PT BP in supine 112/61. min-mod assist of one to progress to EOB. stood 2 x EOB prior to requiring max assist to return and reposition in bed.    Transfers Overall transfer level: Needs assistance Equipment used: Rolling walker (2 wheeled) Transfers: Sit to/from Stand Sit to Stand: From elevated surface;Min assist;Mod assist         General transfer comment: pt stood 2 x EOB but has orthostatic hypotension symptoms. unable to keep eyes open and endorse fatigue quickly. BP in standing on trial one 88/38. recovers to 113/52 prior to standing a 2nd time BP 2nd time 106/48  Ambulation/Gait             General Gait Details: unable to ambulate due to safety concerns     Balance Overall balance assessment: Needs assistance Sitting-balance support: No upper extremity supported;Feet supported Sitting balance-Leahy Scale: Good     Standing balance support: Bilateral upper extremity supported;During functional activity Standing balance-Leahy Scale: Fair Standing balance comment: Steady static standing, limited by BP. stood x ~ 1 minute each time       Cognition Arousal/Alertness: Awake/alert Behavior During Therapy: Restless;Impulsive Overall Cognitive Status: Impaired/Different from baseline (son states," she is not like this normally.") Area of Impairment: Orientation;Attention;Memory;Following commands;Safety/judgement;Awareness;Problem solving                 Orientation Level: Disoriented to;Time;Situation Current Attention Level: Alternating;Selective;Divided Memory: Decreased recall of precautions;Decreased short-term memory Following Commands: Follows one step commands inconsistently;Follows one step commands with increased time Safety/Judgement: Decreased awareness of safety;Decreased awareness of deficits   Problem Solving: Slow processing;Decreased initiation;Difficulty  sequencing;Requires verbal cues;Requires tactile  cues General Comments: Pt is alert throughout session however is very confused. increased confusion from previous date. son present and states her cognition is definitly altered.         General Comments General comments (skin integrity, edema, etc.): pt cognition continues to be concerning. BP concerns limited pt progression. Pain was not an issue this session.      Pertinent Vitals/Pain Pain Assessment: 0-10 Pain Score: 3  Faces Pain Scale: Hurts a little bit Pain Location: L Knee Pain Descriptors / Indicators: Grimacing;Guarding Pain Intervention(s): Limited activity within patient's tolerance;Monitored during session;Premedicated before session;Repositioned;Ice applied     PT Goals (current goals can now be found in the care plan section) Acute Rehab PT Goals Patient Stated Goal: go home and see my dog Progress towards PT goals: Progressing toward goals    Frequency    BID      PT Plan Current plan remains appropriate       AM-PAC PT "6 Clicks" Mobility   Outcome Measure  Help needed turning from your back to your side while in a flat bed without using bedrails?: A Lot Help needed moving from lying on your back to sitting on the side of a flat bed without using bedrails?: A Lot Help needed moving to and from a bed to a chair (including a wheelchair)?: A Lot Help needed standing up from a chair using your arms (e.g., wheelchair or bedside chair)?: A Lot Help needed to walk in hospital room?: A Lot Help needed climbing 3-5 steps with a railing? : Total 6 Click Score: 11    End of Session   Activity Tolerance: Patient limited by lethargy;Treatment limited secondary to medical complications (Comment) (limited by BP) Patient left: in bed;with call bell/phone within reach;with bed alarm set;with family/visitor present Nurse Communication: Mobility status PT Visit Diagnosis: Unsteadiness on feet (R26.81);Other  abnormalities of gait and mobility (R26.89);Muscle weakness (generalized) (M62.81);Difficulty in walking, not elsewhere classified (R26.2);Pain Pain - Right/Left: Left Pain - part of body: Knee     Time: 0732-0812 PT Time Calculation (min) (ACUTE ONLY): 40 min  Charges:  $Therapeutic Activity: 38-52 mins                     Jetta Lout PTA 01/14/21, 8:48 AM

## 2021-01-15 DIAGNOSIS — I2510 Atherosclerotic heart disease of native coronary artery without angina pectoris: Secondary | ICD-10-CM | POA: Insufficient documentation

## 2021-01-15 DIAGNOSIS — K5900 Constipation, unspecified: Secondary | ICD-10-CM | POA: Insufficient documentation

## 2021-01-15 DIAGNOSIS — Z4733 Aftercare following explantation of knee joint prosthesis: Secondary | ICD-10-CM | POA: Insufficient documentation

## 2021-01-15 DIAGNOSIS — Z96652 Presence of left artificial knee joint: Secondary | ICD-10-CM | POA: Insufficient documentation

## 2021-01-15 LAB — GLUCOSE, CAPILLARY
Glucose-Capillary: 142 mg/dL — ABNORMAL HIGH (ref 70–99)
Glucose-Capillary: 123 mg/dL — ABNORMAL HIGH (ref 70–99)

## 2021-01-15 MED ORDER — ACETAMINOPHEN 325 MG PO TABS: 650.0000 mg | ORAL_TABLET | Freq: Four times a day (QID) | ORAL | 0 refills | Status: DC

## 2021-01-15 MED ORDER — HYDROCODONE-ACETAMINOPHEN 5-325 MG PO TABS: 1.0000 | ORAL_TABLET | Freq: Two times a day (BID) | ORAL | 0 refills | Status: DC | PRN

## 2021-01-15 MED ORDER — AMLODIPINE BESYLATE 5 MG PO TABS: 5.0000 mg | ORAL_TABLET | Freq: Every day | ORAL | Status: DC

## 2021-01-15 MED ORDER — LISINOPRIL 20 MG PO TABS: 40.0000 mg | ORAL_TABLET | Freq: Every day | ORAL | Status: DC

## 2021-01-15 MED ORDER — ENOXAPARIN SODIUM 40 MG/0.4ML IJ SOSY: 40.0000 mg | PREFILLED_SYRINGE | INTRAMUSCULAR | 0 refills | Status: DC

## 2021-01-15 NOTE — Care Management Important Message (Signed)
Important Message  Patient Details  Name: Jean Stout MRN: [id number] Date of Birth: Jan 03, 1941   Medicare Important Message Given:  N/A - LOS <3 / Initial given by admissions     [name] [name] 01/15/2021, 9:05 AM

## 2021-01-15 NOTE — Progress Notes (Signed)
Blood pressure (!) 134/54, pulse 70, temperature 97.7 F (36.5 C), resp. rate 16, height 5\' 2"  (1.575 m), weight 78.5 kg, SpO2 95 %. IV cath removed site c/d/I, pt aaox4 subtle confusion, narcotics d/c by md, and holding pressure meds per md. Pt transported via EMS with [facility], bone foam and prevena attached all scripts placed in packet to go with her to facility. Pt transported via ems to facility report successfully called to facility.

## 2021-01-15 NOTE — TOC Progression Note (Signed)
Transition of Care [facility]) - Progression Note    Patient Details  Name: Jean Stout MRN: [id number] Date of Birth: 24-Sep-1940  Transition of Care [facility] CM/SW Contact  Marlowe Sax, RN Phone Number: 01/15/2021, 1:43 PM  Clinical Narrative:    Called EMS to arrange transport, there are 4 ahead of her on the list, She will go to room 507 at [facility], I called Her son and notified him, he stated understanding, the bedside nurse to call report   Expected Discharge Plan: Home w Home Health Services Barriers to Discharge: Continued Medical Work up  Expected Discharge Plan and Services Expected Discharge Plan: Home w Home Health Services   Discharge Planning Services: CM Consult   Living arrangements for the past 2 months: Apartment (her sisiter with mild dementia lives with her and she is the sisiter's caregiver, the sister is now in STR at [facility]) Expected Discharge Date: 01/15/21               DME Arranged: Dan Humphreys rolling DME Agency: [employer] Date DME Agency Contacted: 01/13/21 Time DME Agency Contacted: 1051 Representative spoke with at DME Agency: Bjorn Loser HH Arranged: PT HH Agency: [facility] Date [facility] Agency Contacted: 01/13/21 Time HH Agency Contacted: 1052 Representative spoke with at [facility] Agency: [place]   Social Determinants of Health (SDOH) Interventions    Readmission Risk Interventions No flowsheet data found.

## 2021-01-15 NOTE — Progress Notes (Signed)
Physical Therapy Treatment Patient Details Name: Jean Stout MRN: [id number] DOB: [birth year] Today's Date: 01/15/2021   History of Present Illness Pt is 80 y.o. female s/p L TKA on 01/12/21.  PMH includes: CAD, MI, aortic stenosis, mild MV regurgitation, cardiac murmur, HTN, HLD, T2DM, CKD-III, OSAH (not complaint with nocturnal PAP therapy), GERD (on daily PPI), OA, osteopenia, anxiety, depression.    PT Comments    Pt tolerated session well this AM. Will be dcing to SNF to address deficits with strength, ROM, and overall safe functional mobility. Son present throughout session and very appreciative of care throughout hospitalization. RN aware of pt's abilities.   Recommendations for follow up therapy are one component of a multi-disciplinary discharge planning process, led by the attending physician.  Recommendations may be updated based on patient status, additional functional criteria and insurance authorization.  Follow Up Recommendations  SNF     Equipment Recommendations  Other (comment) (defer to next level of care)       Precautions / Restrictions Precautions Precautions: Knee Precaution Booklet Issued: Yes (comment) Restrictions Weight Bearing Restrictions: Yes LLE Weight Bearing: Weight bearing as tolerated     Mobility  Bed Mobility Overal bed mobility: Needs Assistance Bed Mobility: Supine to Sit     Supine to sit: Min assist;HOB elevated Sit to supine: Mod assist;HOB elevated        Transfers Overall transfer level: Needs assistance Equipment used: Rolling walker (2 wheeled) Transfers: Sit to/from Stand Sit to Stand: Min assist;Mod assist;From elevated surface            Ambulation/Gait Ambulation/Gait assistance: Min Chemical engineer (Feet): 60 Feet Assistive device: Rolling walker (2 wheeled) Gait Pattern/deviations: Step-through pattern;Trunk flexed;Narrow base of support Gait velocity: decreased   General Gait Details: pt  demonstrated imporoved abilities today. tolerated ambulation 60 ft without LOB. min assist at times for safety    Balance Overall balance assessment: Needs assistance Sitting-balance support: No upper extremity supported;Feet supported Sitting balance-Leahy Scale: Good     Standing balance support: Bilateral upper extremity supported;During functional activity Standing balance-Leahy Scale: Fair      Cognition Arousal/Alertness: Awake/alert Behavior During Therapy: WFL for tasks assessed/performed Overall Cognitive Status: Impaired/Different from baseline        General Comments: cognition is still altered but per son is a little better today.      Exercises Total Joint Exercises Goniometric ROM: 2-92        Pertinent Vitals/Pain Pain Assessment: 0-10 Pain Score: 2  Faces Pain Scale: Hurts a little bit Pain Location: L Knee Pain Descriptors / Indicators: Grimacing;Guarding Pain Intervention(s): Limited activity within patient's tolerance;Monitored during session;Premedicated before session;Repositioned;Ice applied     PT Goals (current goals can now be found in the care plan section) Acute Rehab PT Goals Patient Stated Goal: rehab then home\ Progress towards PT goals: Progressing toward goals    Frequency    BID      PT Plan Current plan remains appropriate       AM-PAC PT "6 Clicks" Mobility   Outcome Measure  Help needed turning from your back to your side while in a flat bed without using bedrails?: A Little Help needed moving from lying on your back to sitting on the side of a flat bed without using bedrails?: A Lot Help needed moving to and from a bed to a chair (including a wheelchair)?: A Lot Help needed standing up from a chair using your arms (e.g., wheelchair or bedside chair)?: A Little Help needed  to walk in hospital room?: A Little Help needed climbing 3-5 steps with a railing? : Total 6 Click Score: 14    End of Session Equipment Utilized  During Treatment: Gait belt Activity Tolerance: Patient tolerated treatment well Patient left: in bed;with call bell/phone within reach;with bed alarm set;with family/visitor present Nurse Communication: Mobility status PT Visit Diagnosis: Unsteadiness on feet (R26.81);Other abnormalities of gait and mobility (R26.89);Muscle weakness (generalized) (M62.81);Difficulty in walking, not elsewhere classified (R26.2);Pain Pain - Right/Left: Left Pain - part of body: Knee     Time: 1045-1110 PT Time Calculation (min) (ACUTE ONLY): 25 min  Charges:  $Gait Training: 8-22 mins $Therapeutic Exercise: 8-22 mins                     Jetta Lout PTA 01/15/21, 12:06 PM

## 2021-01-15 NOTE — Discharge Summary (Signed)
Physician Discharge Summary  Patient ID: Tema Alire MRN: [id number] DOB/AGE: 09/24/1940 80 y.o.  Admit date: 01/12/2021 Discharge date: 01/15/2021  Admission Diagnoses:  S/P TKR (total knee replacement) using cement, left [Z96.652]   Discharge Diagnoses: Patient Active Problem List   Diagnosis Date Noted   S/P TKR (total knee replacement) using cement, left 01/12/2021   Bilateral primary osteoarthritis of knee 05/19/2020   Secondary hyperparathyroidism of renal origin (HCC) 09/11/2019   Anemia in chronic kidney disease 08/02/2019   Benign hypertensive kidney disease with chronic kidney disease 08/02/2019   Stage 3a chronic kidney disease (HCC) 08/02/2019   MDD (major depressive disorder), recurrent episode, moderate (HCC) 11/02/2018   GAD (generalized anxiety disorder) 11/02/2018   Panic attacks 11/02/2018   Insomnia due to mental condition 11/02/2018   Primary osteoarthritis of left knee 10/09/2018   Localized primary osteoarthritis of shoulder regions, bilateral 10/09/2018   Right wrist pain 10/09/2018   Chronic pain of right ankle 10/09/2018   Chronic pain syndrome 10/09/2018   Polyarthralgia 10/09/2018   History of bilateral hip replacements 10/09/2018   Aortic stenosis, moderate 03/13/2018   Coronary artery disease involving native coronary artery of native heart without angina pectoris 03/01/2018   Depression 03/01/2018   Essential hypertension 03/01/2018   GERD without esophagitis 03/01/2018   Hyperlipidemia, mixed 03/01/2018   Iron deficiency anemia 03/01/2018   OSA on CPAP 03/01/2018   Type 2 diabetes mellitus without complication, without long-term current use of insulin (HCC) 03/01/2018   Osteoarthritis of multiple joints 03/01/2018    Past Medical History:  Diagnosis Date   Anxiety and depression    Aortic stenosis    Arthritis    Cardiac murmur    a.) Grade I/VI medium pitched mid systolic blowing at lower LSB   CKD (chronic kidney disease), stage  III (HCC)    Coronary artery disease    GERD (gastroesophageal reflux disease)    History of 2019 novel coronavirus disease (COVID-19) 12/29/2020   a.) aymptomatic; PCR (+) on 12/29/2020 with confirmatory (+) rapid PCR on 12/30/2020.   History of MI (myocardial infarction) 2010   HLD (hyperlipidemia)    HTN (hypertension)    IDA (iron deficiency anemia)    Mild mitral regurgitation    Myocardial infarction [facility]) 2010   a.) Tx'd at [facility] [place]; PCI with stents x 2 (unknown type and location).   OSA on CPAP    a.) not complaint with prescribed nocturnal PAP therapy   Osteopenia    PONV (postoperative nausea and vomiting)    T2DM (type 2 diabetes mellitus) (HCC)    Vaginal prolapse      Transfusion: none   Consultants (if any):   Discharged Condition: Improved  Hospital Course: Hero Mccathern is an 80 y.o. female who was admitted 01/12/2021 with a diagnosis of left knee osteoarthritis and went to the operating room on 01/12/2021 and underwent the above named procedures.    Surgeries: Procedure(s): TOTAL KNEE ARTHROPLASTY on 01/12/2021 Patient tolerated the surgery well. Taken to PACU where she was stabilized and then transferred to the orthopedic floor.  Started on Lovenox 30 mg q 24 hrs. Foot pumps applied bilaterally at 80 mm. Heels elevated on bed with rolled towels. No evidence of DVT. Negative Homan. Physical therapy started on day #1 for gait training and transfer. OT started day #1 for ADL and assisted devices.   Patient's foley was d/c on day #1. Patient's IV  was d/c on day #2.AMS on post op day 2. Narcotics  held mental status improving.  On post op day #3 patient was stable and ready for discharge to SNF.    She was given perioperative antibiotics:  Anti-infectives (From admission, onward)    Start     Dose/Rate Route Frequency Ordered Stop   01/12/21 1330  ceFAZolin (ANCEF) IVPB 2g/100 mL premix        2 g 200 mL/hr over 30 Minutes Intravenous Every 6  hours 01/12/21 1104 01/13/21 0444   01/12/21 0606  ceFAZolin (ANCEF) 2-4 GM/100ML-% IVPB       Note to Pharmacy: Mikey Bussing   : cabinet override      01/12/21 0606 01/13/21 0444   01/12/21 0600  ceFAZolin (ANCEF) IVPB 2g/100 mL premix        2 g 200 mL/hr over 30 Minutes Intravenous On call to O.R. 01/12/21 4656 01/12/21 0737     .  She was given sequential compression devices, early ambulation, and Lovenox TEDs for DVT prophylaxis.  She benefited maximally from the hospital stay and there were no complications.    Recent vital signs:  Vitals:   01/15/21 0529 01/15/21 0801  BP: (!) 130/52 (!) 115/51  Pulse: 66 66  Resp: 16 17  Temp: 98.4 F (36.9 C) 97.6 F (36.4 C)  SpO2: 94% 97%    Recent laboratory studies:  Lab Results  Component Value Date   HGB 8.7 (L) 01/14/2021   HGB 9.2 (L) 01/13/2021   HGB 10.4 (L) 01/12/2021   Lab Results  Component Value Date   WBC 8.2 01/14/2021   PLT 181 01/14/2021   No results found for: INR Lab Results  Component Value Date   NA 137 01/13/2021   K 3.7 01/13/2021   CL 107 01/13/2021   CO2 24 01/13/2021   BUN 16 01/13/2021   CREATININE 0.90 01/13/2021   GLUCOSE 143 (H) 01/13/2021    Discharge Medications:   Allergies as of 01/15/2021       Reactions   Gadolinium Derivatives Rash   Iodinated Diagnostic Agents Itching   Bextra [valdecoxib] Rash        Medication List     TAKE these medications    acetaminophen 325 MG tablet Commonly known as: TYLENOL Take 2 tablets (650 mg total) by mouth every 6 (six) hours.   amLODipine 5 MG tablet Commonly known as: NORVASC Take 5 mg by mouth daily.   aspirin EC 81 MG tablet Take 81 mg by mouth daily.   atorvastatin 40 MG tablet Commonly known as: LIPITOR Take 40 mg by mouth at bedtime.   cholecalciferol 25 MCG (1000 UNIT) tablet Commonly known as: VITAMIN D3 Take 1,000 Units by mouth daily.   Cinnamon 500 MG capsule Take 500 mg by mouth daily.   diclofenac  Sodium 1 % Gel Commonly known as: VOLTAREN Apply 1 application topically 3 (three) times daily as needed (pain).   DULoxetine 60 MG capsule Commonly known as: Cymbalta Take 2 capsules (120 mg total) by mouth daily.   famotidine 40 MG tablet Commonly known as: PEPCID Take 40 mg by mouth at bedtime.   glucosamine-chondroitin 500-400 MG tablet Take 2 tablets by mouth daily.   hydrALAZINE 50 MG tablet Commonly known as: APRESOLINE Take 50 mg by mouth 3 (three) times daily.   HYDROcodone-acetaminophen 5-325 MG tablet Commonly known as: Norco Take 1 tablet by mouth 2 (two) times daily as needed for moderate pain. What changed:  when to take this Another medication with the same name was removed.  Continue taking this medication, and follow the directions you see here.   lisinopril 40 MG tablet Commonly known as: ZESTRIL Take 40 mg by mouth daily.   Magnesium 500 MG Caps Take 500 mg by mouth daily.   metFORMIN 500 MG 24 hr tablet Commonly known as: GLUCOPHAGE-XR Take 500 mg by mouth daily with breakfast.   multivitamin with minerals tablet Take 1 tablet by mouth daily.   omeprazole 20 MG capsule Commonly known as: PRILOSEC Take 20 mg by mouth daily.   Turmeric 450 MG Caps Take 450 mg by mouth daily.               Durable Medical Equipment  (From admission, onward)           Start     Ordered   01/12/21 1105  DME Walker rolling  Once       Question Answer Comment  Walker: With 5 Inch Wheels   Patient needs a walker to treat with the following condition S/P TKR (total knee replacement) using cement, left      01/12/21 1104   01/12/21 1105  DME 3 n 1  Once        01/12/21 1104   01/12/21 1105  DME Bedside commode  Once       Question:  Patient needs a bedside commode to treat with the following condition  Answer:  S/P TKR (total knee replacement) using cement, left   01/12/21 1104            Diagnostic Studies: DG Knee 1-2 Views Left  Result  Date: 01/12/2021 CLINICAL DATA:  80 year old female status post left knee replacement. EXAM: LEFT KNEE - 1-2 VIEW COMPARISON:  Left knee CT 11/24/2020. FINDINGS: AP and cross-table lateral views at 0947 hours. Left knee total arthroplasty hardware now in place. Hardware appears intact and aligned. Postoperative changes to the patella. Postoperative gas in the joint space. Anterior skin staples and wound VAC. No unexpected osseous changes. IMPRESSION: Left knee total arthroplasty with no adverse features. Electronically Signed   By: Odessa Fleming M.D.   On: 01/12/2021 10:09    Disposition:      Contact information for follow-up providers     Evon Slack, PA-C Follow up in 2 week(s).   Specialties: Orthopedic Surgery, Emergency Medicine Contact information: [address] [place] [address] [phone]              Contact information for after-discharge care     Destination     [facility] Preferred SNF .   Service: Skilled Nursing Contact information: [address] [place] [address] [phone]                      Signed: Patience Musca 01/15/2021, 8:28 AM

## 2021-01-15 NOTE — Progress Notes (Signed)
Subjective: 3 Days Post-Op Procedure(s) (LRB): TOTAL KNEE ARTHROPLASTY (Left) Patient reports pain as mild.  Patient is well, and has had no acute complaints or problems Denies any CP, SOB, ABD pain.  No nausea or vomiting.  No headaches. We will continue therapy today.  Plan is to go Home after hospital stay. Son is at bedside, states cognition about the same  Objective: Vital signs in last 24 hours: Temp:  [97.6 F (36.4 C)-98.8 F (37.1 C)] 97.6 F (36.4 C) (10/21 0801) Pulse Rate:  [66-74] 66 (10/21 0801) Resp:  [16-18] 17 (10/21 0801) BP: (107-130)/(43-52) 115/51 (10/21 0801) SpO2:  [94 %-98 %] 97 % (10/21 0801)  Intake/Output from previous day: 10/20 0701 - 10/21 0700 In: 1440.9 [I.V.:918.5; IV Piggyback:522.4] Out: -  Intake/Output this shift: No intake/output data recorded.  Recent Labs    01/12/21 1119 01/13/21 0330 01/14/21 0707  HGB 10.4* 9.2* 8.7*   Recent Labs    01/13/21 0330 01/14/21 0707  WBC 5.9 8.2  RBC 3.24* 2.84*  HCT 29.1* 25.7*  PLT 223 181   Recent Labs    01/12/21 1119 01/13/21 0330  NA  --  137  K  --  3.7  CL  --  107  CO2  --  24  BUN  --  16  CREATININE 1.08* 0.90  GLUCOSE  --  143*  CALCIUM  --  8.0*   No results for input(s): LABPT, INR in the last 72 hours.  EXAM General - Patient is Alert, Appropriate, and Oriented.  Subtle confusion Extremity - Neurovascular intact Sensation intact distally Intact pulses distally Dorsiflexion/Plantar flexion intact Negative Homans' sign bilaterally Dressing - dressing C/D/I and no drainage, Prevena intact without drainage Motor Function - intact, moving foot and toes well on exam.   Past Medical History:  Diagnosis Date   Anxiety and depression    Aortic stenosis    Arthritis    Cardiac murmur    a.) Grade I/VI medium pitched mid systolic blowing at lower LSB   CKD (chronic kidney disease), stage III (HCC)    Coronary artery disease    GERD (gastroesophageal reflux  disease)    History of 2019 novel coronavirus disease (COVID-19) 12/29/2020   a.) aymptomatic; PCR (+) on 12/29/2020 with confirmatory (+) rapid PCR on 12/30/2020.   History of MI (myocardial infarction) 2010   HLD (hyperlipidemia)    HTN (hypertension)    IDA (iron deficiency anemia)    Mild mitral regurgitation    Myocardial infarction [facility]) 2010   a.) Tx'd at [facility]; PCI with stents x 2 (unknown type and location).   OSA on CPAP    a.) not complaint with prescribed nocturnal PAP therapy   Osteopenia    PONV (postoperative nausea and vomiting)    T2DM (type 2 diabetes mellitus) (HCC)    Vaginal prolapse     Assessment/Plan:   3 Days Post-Op Procedure(s) (LRB): TOTAL KNEE ARTHROPLASTY (Left) Active Problems:   S/P TKR (total knee replacement) using cement, left  Estimated body mass index is 31.64 kg/m as calculated from the following:   Height as of this encounter: 5\' 2"  (1.575 m).   Weight as of this encounter: 78.5 kg. Advance diet Up with therapy  Pain improved but patient noted to have some confusion along with hypotension. We will hold blood pressure medications and discontinue narcotics. Continue with scheduled Tylenol.   Acute postop blood loss anemia -hemoglobin 8.7, stable.  Continue with iron supplement  Vital  signs are stable.  BP soft. Hold BP meds  Care management to assist with discharge to SNF today  DVT Prophylaxis - Lovenox, Foot Pumps, and TED hose Weight-Bearing as tolerated to left leg   T. Cranston Neighbor, PA-C [facility] 01/15/2021, 8:19 AM

## 2021-01-15 NOTE — Discharge Instructions (Signed)

## 2021-01-22 ENCOUNTER — Telehealth: Payer: Self-pay | Admitting: Student in an Organized Health Care Education/Training Program

## 2021-01-22 NOTE — Telephone Encounter (Signed)
Patient had knee replacement left sided. In rehab then going to nieces for couple weeks. Can she be changed to phone visit for her 02-02-21 appt? Please advise patient. I told her it would be Monday before we get back with her.

## 2021-02-02 ENCOUNTER — Ambulatory Visit
Payer: Medicare Other | Attending: Student in an Organized Health Care Education/Training Program | Admitting: Student in an Organized Health Care Education/Training Program

## 2021-02-02 ENCOUNTER — Encounter: Payer: Self-pay | Admitting: Student in an Organized Health Care Education/Training Program

## 2021-02-02 ENCOUNTER — Other Ambulatory Visit: Payer: Self-pay

## 2021-02-02 DIAGNOSIS — M170 Bilateral primary osteoarthritis of knee: Secondary | ICD-10-CM

## 2021-02-02 DIAGNOSIS — F331 Major depressive disorder, recurrent, moderate: Secondary | ICD-10-CM

## 2021-02-02 DIAGNOSIS — M25571 Pain in right ankle and joints of right foot: Secondary | ICD-10-CM | POA: Diagnosis not present

## 2021-02-02 DIAGNOSIS — M19011 Primary osteoarthritis, right shoulder: Secondary | ICD-10-CM

## 2021-02-02 DIAGNOSIS — G894 Chronic pain syndrome: Secondary | ICD-10-CM

## 2021-02-02 DIAGNOSIS — M19012 Primary osteoarthritis, left shoulder: Secondary | ICD-10-CM

## 2021-02-02 DIAGNOSIS — G8929 Other chronic pain: Secondary | ICD-10-CM

## 2021-02-02 DIAGNOSIS — M1712 Unilateral primary osteoarthritis, left knee: Secondary | ICD-10-CM

## 2021-02-02 MED ORDER — HYDROCODONE-ACETAMINOPHEN 7.5-325 MG PO TABS: 1.0000 | ORAL_TABLET | Freq: Two times a day (BID) | ORAL | 0 refills | Status: AC | PRN

## 2021-02-02 MED ORDER — HYDROCODONE-ACETAMINOPHEN 7.5-325 MG PO TABS: 1.0000 | ORAL_TABLET | Freq: Three times a day (TID) | ORAL | 0 refills | Status: AC | PRN

## 2021-02-02 NOTE — Progress Notes (Signed)
Patient: Jean Stout  Service Category: E/M  Provider: Gillis Santa, MD  DOB: September 23, 1940  DOS: 02/02/2021  Location: Office  MRN: [id number]  Setting: Ambulatory outpatient  Referring Provider: Gladstone Lighter, MD  Type: Established Patient  Specialty: Interventional Pain Management  PCP: Gladstone Lighter, MD  Location: Remote location  Delivery: TeleHealth     Virtual Encounter - Pain Management PROVIDER NOTE: Information contained herein reflects review and annotations entered in association with encounter. Interpretation of such information and data should be left to medically-trained personnel. Information provided to patient can be located elsewhere in the medical record under "Patient Instructions". Document created using STT-dictation technology, any transcriptional errors that may result from process are unintentional.    Contact & Pharmacy Preferred: [phone] Home: [phone] (home) Mobile: There is no such number on file (mobile). E-mail: No e-mail address on record  [id number] [place] [id number]: [phone]: [phone]  Pre-screening  Ms. DAnzaldooffered "in-person" vs "virtual" encounter. She indicated preferring virtual for this encounter.   Reason COVID-19*  Social distancing based on CDC and AMA recommendations.   I contacted Jean Stout 02/02/2021 via telephone.      I clearly identified myself as BGillis Santa MD. I verified that I was speaking with the correct person using two identifiers (Name: Jean Stout and date of birth: 1Mar 30, 1942.  Consent I sought verbal advanced consent from Jean Severinfor virtual visit interactions. I informed Jean Stout possible security and privacy concerns, risks, and limitations associated with providing "not-in-person" medical evaluation and management services. I also informed Jean Stout the availability of "in-person" appointments.  Finally, I informed her that there would be a charge for the virtual visit and that she could be  personally, fully or partially, financially responsible for it. Jean Stout.   Historic Elements   Ms. MDeserea Bordleyis a 80y.o. year old, female patient evaluated today after our last contact on 01/22/2021. Jean Stout has a past medical history of Anxiety and depression, Aortic stenosis, Arthritis, Cardiac murmur, CKD (chronic kidney disease), stage III ([place], Coronary artery disease, GERD (gastroesophageal reflux disease), History of 2019 novel coronavirus disease (COVID-19) (12/29/2020), History of MI (myocardial infarction) (2010), HLD (hyperlipidemia), HTN (hypertension), IDA (iron deficiency anemia), Mild mitral regurgitation, [place]), OSA on CPAP, Osteopenia, PONV (postoperative nausea and vomiting), T2DM (type 2 diabetes mellitus) ([place], and Vaginal prolapse. She also  has a past surgical history that includes Carotid stent; Joint replacement (Bilateral, 2007); Coronary angioplasty with stent (N/A, 2010); Eye surgery; Carpal tunnel release (Left, 1983); Cyst excision (1984); Kyphoplasty (N/A, 09/08/2020); and Total knee arthroplasty (Left, 01/12/2021). Ms. DBeamonhas a current medication list which includes the following prescription(s): acetaminophen, amlodipine, aspirin ec, atorvastatin, diclofenac sodium, duloxetine, famotidine, hydralazine, lisinopril, metformin, multivitamin with minerals, omeprazole, enoxaparin, [START ON 02/24/2021] hydrocodone-acetaminophen, and [START ON 03/26/2021] hydrocodone-acetaminophen. She  reports that she has never smoked. She has never used smokeless tobacco. She reports that she does not currently use alcohol. She reports that she does not use drugs. Ms. DMontelongois allergic to gadolinium derivatives, iodinated diagnostic agents, and bextra [valdecoxib].   HPI  Today, she is being contacted for  medication management.  S/p left TKA on 01/12/21 with Dr MRudene ChristiansIs working with PT (home PT for 8 weeks) Is utilizing Hydrocodone 7.5 mg BID-TID prn, will send Rx for 11/30 for quantity 90 and  then will wean thereafter to twice daily as needed  Pharmacotherapy Assessment   Analgesic: Hydrocodone 7.5 mg q6-8 hrs prn daily    Monitoring: [place] PMP: PDMP reviewed during this encounter.       Pharmacotherapy: No side-effects or adverse reactions reported. Compliance: No problems identified. Effectiveness: Clinically acceptable. Plan: Refer to "POC". UDS:  Summary  Date Value Ref Range Status  03/19/2020 Note  Final    Comment:    ==================================================================== Tox[employer] Select 13 (MW) ==================================================================== Test                             Result       Flag       Units  Drug Present and Declared for Prescription Verification   Tramadol                       >3497        EXPECTED   ng/mg creat   O-Desmethyltramadol            >3497        EXPECTED   ng/mg creat   N-Desmethyltramadol            >3497        EXPECTED   ng/mg creat    Source of tramadol is a prescription medication. O-desmethyltramadol    and N-desmethyltramadol are expected metabolites of tramadol.  ==================================================================== Test                      Result    Flag   Units      Ref Range   Creatinine              143              mg/dL      >=20 ==================================================================== Declared Medications:  The flagging and interpretation on this report are based on the  following declared medications.  Unexpected results may arise from  inaccuracies in the declared medications.   **Note: The testing scope of this panel includes these medications:   Tramadol (Ultram)   **Note: The testing scope of this panel does not include the  following reported  medications:   Amlodipine (Norvasc)  Aspirin  Atorvastatin (Lipitor)  Cetirizine (Zyrtec)  Diclofenac  Docusate (Colace)  Duloxetine (Cymbalta)  Famotidine (Pepcid)  Hydralazine (Apresoline)  Hydrochlorothiazide (Hydrodiuril)  Lisinopril (Zestril)  Metformin (Glucophage)  Omeprazole (Prilosec)  Pantoprazole (Protonix) ==================================================================== For clinical consultation, please call [phone]. ====================================================================      Laboratory Chemistry Profile   Renal Lab Results  Component Value Date   BUN 16 01/13/2021   CREATININE 0.90 01/13/2021   GFRNONAA >60 01/13/2021    Hepatic Lab Results  Component Value Date   AST 24 12/30/2020   ALT 20 12/30/2020   ALBUMIN 4.2 12/30/2020   ALKPHOS 77 12/30/2020    Electrolytes Lab Results  Component Value Date   NA 137 01/13/2021   K 3.7 01/13/2021   CL 107 01/13/2021   CALCIUM 8.0 (L) 01/13/2021    Bone No results found for: VD25OH, VD125OH2TOT, VD3125OH2, VD2125OH2, 25OHVITD1, 25OHVITD2, 25OHVITD3, TESTOFREE, TESTOSTERONE  Inflammation (CRP: Acute Phase) (ESR: Chronic Phase) No results found for: CRP, ESRSEDRATE, LATICACIDVEN       Note: Above Lab results reviewed.  Imaging  DG Knee 1-2 Views Left CLINICAL DATA:  79-year-old female status post left knee replacement.  EXAM: LEFT KNEE - 1-2   VIEW  COMPARISON:  Left knee CT 11/24/2020.  FINDINGS: AP and cross-table lateral views at 0947 hours. Left knee total arthroplasty hardware now in place. Hardware appears intact and aligned. Postoperative changes to the patella. Postoperative gas in the joint space. Anterior skin staples and wound VAC. No unexpected osseous changes.  IMPRESSION: Left knee total arthroplasty with no adverse features.  Electronically Signed   By: H  Hall M.D.   On: 01/12/2021 10:09  Assessment  The primary encounter diagnosis was Bilateral  primary osteoarthritis of knee. Diagnoses of Localized primary osteoarthritis of shoulder regions, bilateral, Chronic pain of right ankle, MDD (major depressive disorder), recurrent episode, moderate (HCC), Primary osteoarthritis of left knee, and Chronic pain syndrome were also pertinent to this visit.  Plan of Care    Jean Stout has a current medication list which includes the following long-term medication(s): amlodipine, atorvastatin, duloxetine, famotidine, hydralazine, lisinopril, metformin, omeprazole, and enoxaparin.  Pharmacotherapy (Medications Ordered): Meds ordered this encounter  Medications   HYDROcodone-acetaminophen (NORCO) 7.5-325 MG tablet    Sig: Take 1 tablet by mouth every 8 (eight) hours as needed for moderate pain.    Dispense:  90 tablet    Refill:  0   HYDROcodone-acetaminophen (NORCO) 7.5-325 MG tablet    Sig: Take 1 tablet by mouth every 12 (twelve) hours as needed for moderate pain.    Dispense:  60 tablet    Refill:  0     Follow-up plan:   Return for patient will call to schedule her F2F med refill.     Left intra-articular knee steroid injection on 05/27/2019, bilateral knee steroid injection 08/21/2019, 11/13/19, 02/05/20.  Has previously had 5 series of viscosupplementation at outside clinic.  States it was not effective.  Status post genicular nerve block which was not effective.  Can consider sprint PNS in future      Recent Visits Date Type Provider Dept  11/05/20 Office Visit Lateef, Bilal, MD [facility]  Showing recent visits within past 90 days and meeting all other requirements Today's Visits Date Type Provider Dept  02/02/21 Office Visit Lateef, Bilal, MD [facility]  Showing today's visits and meeting all other requirements Future Appointments No visits were found meeting these conditions. Showing future appointments within next 90 days and meeting all other requirements I discussed the assessment and treatment  plan with the patient. The patient was provided an opportunity to ask questions and all were answered. The patient agreed with the plan and demonstrated an understanding of the instructions.  Patient advised to call back or seek an in-person evaluation if the symptoms or condition worsens.  Duration of encounter: 30minutes.  Note by: Bilal Lateef, MD Date: 02/02/2021; Time: 10:01 AM  

## 2021-02-04 ENCOUNTER — Encounter: Payer: Medicare Other | Admitting: Student in an Organized Health Care Education/Training Program

## 2021-04-08 ENCOUNTER — Other Ambulatory Visit: Payer: Self-pay | Admitting: Orthopedic Surgery

## 2021-04-15 ENCOUNTER — Encounter
Admission: RE | Admit: 2021-04-15 | Discharge: 2021-04-15 | Disposition: A | Discharge status: Home / Self Care | Payer: Medicare Other | Source: Ambulatory Visit | Attending: Orthopedic Surgery | Admitting: Orthopedic Surgery

## 2021-04-15 ENCOUNTER — Encounter: Payer: Self-pay | Admitting: Orthopedic Surgery

## 2021-04-15 ENCOUNTER — Other Ambulatory Visit: Payer: Self-pay

## 2021-04-15 VITALS — BP 150/64 | HR 62 | Temp 98.2°F | Resp 20 | Ht 62.0 in | Wt 177.5 lb

## 2021-04-15 DIAGNOSIS — Z96652 Presence of left artificial knee joint: Secondary | ICD-10-CM

## 2021-04-15 DIAGNOSIS — Z01818 Encounter for other preprocedural examination: Secondary | ICD-10-CM | POA: Insufficient documentation

## 2021-04-15 DIAGNOSIS — N390 Urinary tract infection, site not specified: Secondary | ICD-10-CM | POA: Diagnosis not present

## 2021-04-15 DIAGNOSIS — Z01810 Encounter for preprocedural cardiovascular examination: Secondary | ICD-10-CM

## 2021-04-15 LAB — COMPREHENSIVE METABOLIC PANEL
ALT: 20 U/L (ref 0–44)
AST: 27 U/L (ref 15–41)
Albumin: 4.1 g/dL (ref 3.5–5.0)
Alkaline Phosphatase: 87 U/L (ref 38–126)
Anion gap: 8 (ref 5–15)
BUN: 19 mg/dL (ref 8–23)
CO2: 26 mmol/L (ref 22–32)
Calcium: 8.9 mg/dL (ref 8.9–10.3)
Chloride: 101 mmol/L (ref 98–111)
Creatinine, Ser: 0.9 mg/dL (ref 0.44–1.00)
GFR, Estimated: >60 mL/min (ref >60)
Glucose, Bld: 114 mg/dL — ABNORMAL HIGH (ref 70–99)
Potassium: 4.2 mmol/L (ref 3.5–5.1)
Sodium: 135 mmol/L (ref 135–145)
Total Bilirubin: 0.5 mg/dL (ref 0.3–1.2)
Total Protein: 7.1 g/dL (ref 6.5–8.1)

## 2021-04-15 LAB — URINALYSIS, ROUTINE W REFLEX MICROSCOPIC
Bilirubin Urine: NEGATIVE
Glucose, UA: NEGATIVE mg/dL
Hgb urine dipstick: NEGATIVE
Ketones, ur: NEGATIVE mg/dL
Nitrite: NEGATIVE
Protein, ur: NEGATIVE mg/dL
Specific Gravity, Urine: 1.013 (ref 1.005–1.030)
pH: 6 (ref 5.0–8.0)

## 2021-04-15 LAB — TYPE AND SCREEN
ABO/RH(D): O POS
Antibody Screen: NEGATIVE

## 2021-04-15 LAB — CBC WITH DIFFERENTIAL/PLATELET
Abs Immature Granulocytes: 0.01 10*3/uL (ref 0.00–0.07)
Basophils Absolute: 0.1 10*3/uL (ref 0.0–0.1)
Basophils Relative: 1 %
Eosinophils Absolute: 0.1 10*3/uL (ref 0.0–0.5)
Eosinophils Relative: 3 %
HCT: 32.6 % — ABNORMAL LOW (ref 36.0–46.0)
Hemoglobin: 10.3 g/dL — ABNORMAL LOW (ref 12.0–15.0)
Immature Granulocytes: 0 %
Lymphocytes Relative: 40 %
Lymphs Abs: 1.8 10*3/uL (ref 0.7–4.0)
MCH: 28.2 pg (ref 26.0–34.0)
MCHC: 31.6 g/dL (ref 30.0–36.0)
MCV: 89.3 fL (ref 80.0–100.0)
Monocytes Absolute: 0.4 10*3/uL (ref 0.1–1.0)
Monocytes Relative: 8 %
Neutro Abs: 2.1 10*3/uL (ref 1.7–7.7)
Neutrophils Relative %: 48 %
Platelets: 284 10*3/uL (ref 150–400)
RBC: 3.65 MIL/uL — ABNORMAL LOW (ref 3.87–5.11)
RDW: 14.1 % (ref 11.5–15.5)
WBC: 4.4 10*3/uL (ref 4.0–10.5)
nRBC: 0 % (ref 0.0–0.2)

## 2021-04-15 LAB — SURGICAL PCR SCREEN
MRSA, PCR: NEGATIVE
Staphylococcus aureus: NEGATIVE

## 2021-04-15 NOTE — Patient Instructions (Addendum)
Your procedure is scheduled on: Tuesday April 27, 2021. Report to Day Surgery inside [facility] 2nd floor.  To find out your arrival time please call [phone] between 1PM - 3PM on Monday April 26, 2021.  Remember: Instructions that are not followed completely may result in serious medical risk,  up to and including death, or upon the discretion of your surgeon and anesthesiologist your  surgery may need to be rescheduled.     _X__ 1. Do not eat food after midnight the night before your procedure.                 No chewing gum or hard candies. You may drink clear liquids up to 2 hours                 before you are scheduled to arrive for your surgery- DO not drink clear                 liquids within 2 hours of the start of your surgery.                 Clear Liquids include:  water, apple juice without pulp, clear Gatorade, G2 or                  Gatorade Zero (avoid Red/Purple/Blue), Black Coffee or Tea (Do not add                 anything to coffee or tea).  __X__2.   Complete the "Ensure Clear Pre-surgery Clear Carbohydrate Drink" provided to you, 2 hours before arrival. **If you are diabetic you will be provided with an alternative drink, Gatorade Zero or G2.  __X__3.  On the morning of surgery brush your teeth with toothpaste and water, you                may rinse your mouth with mouthwash if you wish.  Do not swallow any toothpaste of mouthwash.     _X__ 4.  No Alcohol for 24 hours before or after surgery.   _X__ 5.  Do Not Smoke or use e-cigarettes For 24 Hours Prior to Your Surgery.                 Do not use any chewable tobacco products for at least 6 hours prior to                 Surgery.  _X__  6.  Do not use any recreational drugs (marijuana, cocaine, heroin, ecstasy, MDMA or other)                For at least one week prior to your surgery.  Combination of these drugs with anesthesia                May have life threatening  results.  ____  7.  Bring all medications with you on the day of surgery if instructed.   __X__8.  Notify your doctor if there is any change in your medical condition      (cold, fever, infections).     Do not wear jewelry, make-up, hairpins, clips or nail polish. Do not wear lotions, powders, or perfumes. You may wear deodorant. Do not shave 48 hours prior to surgery. Men may shave face and neck. Do not bring valuables to the hospital.    [facility] is not responsible for any belongings or valuables.  Contacts, dentures or bridgework may not be worn into  surgery. Leave your suitcase in the car. After surgery it may be brought to your room. For patients admitted to the hospital, discharge time is determined by your treatment team.   Patients discharged the day of surgery will not be allowed to drive home.   Make arrangements for someone to be with you for the first 24 hours of your Same Day Discharge.   __X__ Take these medicines the morning of surgery with A SIP OF WATER:    1. amLODipine (NORVASC) 5 MG  2. DULoxetine (CYMBALTA) 60 MG   3. omeprazole (PRILOSEC) 20 MG   4. hydrALAZINE (APRESOLINE) 50 MG  5.  6.  ____ Fleet Enema (as directed)   __X__ Use CHG Soap (or wipes) as directed  ____ Use Benzoyl Peroxide Gel as instructed  ____ Use inhalers on the day of surgery  __X__ Stop  metFORMIN (GLUCOPHAGE-XR) 500 MG 2 days prior to surgery (Take last dose 04/24/21)   ____ Take 1/2 of usual insulin dose the night before surgery. No insulin the morning          of surgery.   __X__ Call and ask your doctor when to stop your aspirin 81 mg  before your surgery.   __X__ One Week prior to surgery- Stop Anti-inflammatories such as Ibuprofen, Aleve, Advil, Motrin, meloxicam (MOBIC), diclofenac, etodolac, ketorolac, Toradol, Daypro, piroxicam, Goody's or BC powders. OK TO USE TYLENOL IF NEEDED   __X__ Stop ALL supplements until after surgery.    ____ Bring C-Pap to the  hospital.    If you have any questions regarding your pre-procedure instructions,  Please call Pre-admit Testing at [phone]

## 2021-04-15 NOTE — Progress Notes (Signed)
Perioperative Services  Pre-Admission/Anesthesia Testing Clinical Review  Date: 04/20/21  Patient Demographics:  Name: Jean Stout DOB:   10/18/1940 MRN:   [id number]  Planned Surgical Procedure(s):    Case: [id number] Date/Time: 04/27/21 0700   Procedure: TOTAL KNEE ARTHROPLASTY (Right: Knee)   Anesthesia type: Choice   Pre-op diagnosis: Primary osteoarthritis of right knee  M17.11   Location: [facility] OR ROOM 01 / [place] [facility] FOR ANESTHESIA [facility]   Surgeons: Hessie Knows, MD   NOTE: Available PAT nursing documentation and vital signs have been reviewed. Clinical nursing staff has updated patient's PMH/PSHx, current medication list, and drug allergies/intolerances to ensure comprehensive history available to assist in medical decision making as it pertains to the aforementioned surgical procedure and anticipated anesthetic course. Extensive review of available clinical information performed. [place] PMH and PSHx updated with any diagnoses/procedures that  may have been inadvertently omitted during her intake with the pre-admission testing department's nursing staff.  Clinical Discussion:  Jean Stout is a 81 y.o. female who is submitted for pre-surgical anesthesia review and clearance prior to her undergoing the above procedure. Patient has never been a smoker.  Pertinent PMH includes: CAD, MI, aortic stenosis, mild MV regurgitation, cardiac murmur, HTN, HLD, T2DM, CKD-III, OSAH (not complaint with nocturnal PAP therapy), GERD (on daily PPI), OA, osteopenia, anxiety, depression.   Patient is followed by cardiology Ubaldo Glassing, MD). She was last seen in the cardiology clinic on 11/25/2020; notes reviewed.  At the time of her clinic visit, patient doing well overall from a cardiovascular perspective.  Patient denied any chest pain, PND, orthopnea, palpitations, significant peripheral edema, vertiginous symptoms, or presyncope/syncope.  Patient with intermittent episodes of shortness of breath and  palpitations. PMH significant for cardiovascular diagnoses.  Information limited as patient has received care in [place], [place], and here in [place].  Information regarding remote cardiac history obtained from local cardiologist's documentation.   Patient suffered a MI in 2010 while living in [place].  She reportedly underwent PCI with stent placement x 2.  Degree of stenosis and location of stent placement unknown.   Patient underwent TTE in 08/2017 that revealed normal left ventricular systolic function with an estimated EF of 55%.  There was mild aortic valve calcification and mild mitral valve regurgitation noted.   Myocardial perfusion imaging study performed on 07/23/2018 revealed an LVEF of 58% with no evidence of stress-induced myocardial ischemia or arrhythmia.   Blood pressure reasonably controlled at 132/76 on currently prescribed CCB, vasodilator, and ACEi therapies.  Patient is on a statin for her HLD. T2DM well-controlled on her currently prescribed regimen; last Hgb A1c was 6.7% when checked on 05/29/2020. She has a diagnosis of controlled OSAH, however she is noncompliant with her prescribed nocturnal PAP therapy. Functional capacity, as defined by DASI, is documented as being >/= 4 METS.  No changes were made to patient's medication regimen.  Patient to follow-up with outpatient cardiology in 6 months or sooner if needed.  [name] is scheduled for an elective RIGHT TOTAL KNEE ARTHROPLASTY on 04/27/2021 with Dr. Hessie Knows, MD.  Given patient's past medical history significant for cardiovascular diagnoses, presurgical cardiac clearance was sought by the PAT team. Per cardiology, "this patient is optimized for surgery and may proceed with the planned procedural course with a LOW risk of significant perioperative cardiovascular complications". This patient is on daily antiplatelet therapy. She has been instructed on recommendations for holding her daily low-dose ASA  for 5 days prior to her procedure with plans to  restart as soon as postoperative bleeding risk felt to be minimized by her attending surgeon. The patient has been instructed that her last dose of her anticoagulant will be on 04/21/2021.  Patient denies previous perioperative complications with anesthesia in the past. In review of the available records, it is noted that patient underwent a general + neuraxial anesthetic course here (ASA III) in 12/2020 without documented complications.   Vitals with BMI 04/15/2021 01/15/2021 01/15/2021  Height 5\' 2"  - -  Weight 177 lbs 8 oz - -  BMI 32.46 - -  Systolic 150 134 549  Diastolic 64 54 62  Pulse 62 70 64  Some encounter information is confidential and restricted. Go to Review Flowsheets activity to see all data.    Providers/Specialists:   NOTE: Primary physician provider listed below. Patient may have been seen by APP or partner within same practice.   PROVIDER ROLE / SPECIALTY LAST Inge Rise, MD Orthopedics (Surgeon) 02/24/2021  Enid Baas, MD Primary Care Provider 02/24/2021  Harold Hedge, MD Cardiology 11/25/2020  Lamont Dowdy, MD Nephrology 02/24/2021   Allergies:  Gadolinium derivatives, Iodinated contrast media, and Bextra [valdecoxib]  Current Home Medications:   No current facility-administered medications for this encounter.    acetaminophen (TYLENOL) 325 MG tablet   amLODipine (NORVASC) 5 MG tablet   aspirin EC 81 MG tablet   atorvastatin (LIPITOR) 40 MG tablet   cholecalciferol (VITAMIN D3) 25 MCG (1000 UNIT) tablet   diclofenac Sodium (VOLTAREN) 1 % GEL   DULoxetine (CYMBALTA) 60 MG capsule   famotidine (PEPCID) 40 MG tablet   glucosamine-chondroitin 500-400 MG tablet   hydrALAZINE (APRESOLINE) 50 MG tablet   HYDROcodone-acetaminophen (NORCO) 7.5-325 MG tablet   lisinopril (PRINIVIL,ZESTRIL) 40 MG tablet   Magnesium 250 MG TABS   metFORMIN (GLUCOPHAGE-XR) 500 MG 24 hr tablet   Multiple  Vitamins-Minerals (MULTIVITAMIN WITH MINERALS) tablet   omeprazole (PRILOSEC) 20 MG capsule   enoxaparin (LOVENOX) 40 MG/0.4ML injection   History:   Past Medical History:  Diagnosis Date   Anxiety and depression    Aortic stenosis    Arthritis    Cardiac murmur    a.) Grade I/VI medium pitched mid systolic blowing at lower LSB   CKD (chronic kidney disease), stage III (HCC)    Coronary artery disease    GERD (gastroesophageal reflux disease)    History of 2019 novel coronavirus disease (COVID-19) 12/29/2020   a.) asymptomatic; PCR (+) on 12/29/2020 with confirmatory (+) rapid PCR on 12/30/2020.   History of MI (myocardial infarction) 2010   HLD (hyperlipidemia)    HTN (hypertension)    IDA (iron deficiency anemia)    Mild mitral regurgitation    Myocardial infarction [facility]) 2010   a.) Tx'd at [facility]; PCI with stents x 2 (unknown type and location).   OSA on CPAP    a.) not complaint with prescribed nocturnal PAP therapy   Osteopenia    PONV (postoperative nausea and vomiting)    T2DM (type 2 diabetes mellitus) (HCC)    Vaginal prolapse    Past Surgical History:  Procedure Laterality Date   CAROTID STENT     CARPAL TUNNEL RELEASE Left 1983   CORONARY ANGIOPLASTY WITH STENT PLACEMENT N/A 2010   stents x 2 placed (unknown type and location); Location: [facility]   CYST EXCISION  1984   from Left side of neck   EYE SURGERY     JOINT REPLACEMENT Bilateral 2007   hips  KYPHOPLASTY N/A 09/08/2020   Procedure: L2  KYPHOPLASTY;  Surgeon: Hessie Knows, MD;  Location: [facility] ORS;  Service: Orthopedics;  Laterality: N/A;   TOTAL KNEE ARTHROPLASTY Left 01/12/2021   Procedure: TOTAL KNEE ARTHROPLASTY;  Surgeon: Hessie Knows, MD;  Location: [facility] ORS;  Service: Orthopedics;  Laterality: Left;   Family History  Problem Relation Age of Onset   Hypertension Mother    Anxiety disorder Mother    Heart attack Father    Sudden Cardiac Death Father 50    Bipolar disorder Sister    Sudden Cardiac Death Brother 41   Cancer Daughter        Breast   Hypertension Son    Hyperlipidemia Son    Heart attack Son    Sleep apnea Son    Social History   Tobacco Use   Smoking status: Never   Smokeless tobacco: Never  Vaping Use   Vaping Use: Never used  Substance Use Topics   Alcohol use: Yes    Alcohol/week: 1.0 standard drink    Types: 1 Glasses of wine per week    Comment: rarely   Drug use: Never    Pertinent Clinical Results:  LABS: Labs reviewed: Acceptable for surgery.  No visits with results within 3 Day(s) from this visit.  Latest known visit with results is:  Hospital Outpatient Visit on 04/15/2021  Component Date Value Ref Range Status   WBC 04/15/2021 4.4  4.0 - 10.5 K/uL Final   RBC 04/15/2021 3.65 (L)  3.87 - 5.11 MIL/uL Final   Hemoglobin 04/15/2021 10.3 (L)  12.0 - 15.0 g/dL Final   HCT 04/15/2021 32.6 (L)  36.0 - 46.0 % Final   MCV 04/15/2021 89.3  80.0 - 100.0 fL Final   MCH 04/15/2021 28.2  26.0 - 34.0 pg Final   MCHC 04/15/2021 31.6  30.0 - 36.0 g/dL Final   RDW 04/15/2021 14.1  11.5 - 15.5 % Final   Platelets 04/15/2021 284  150 - 400 K/uL Final   nRBC 04/15/2021 0.0  0.0 - 0.2 % Final   Neutrophils Relative % 04/15/2021 48  % Final   Neutro Abs 04/15/2021 2.1  1.7 - 7.7 K/uL Final   Lymphocytes Relative 04/15/2021 40  % Final   Lymphs Abs 04/15/2021 1.8  0.7 - 4.0 K/uL Final   Monocytes Relative 04/15/2021 8  % Final   Monocytes Absolute 04/15/2021 0.4  0.1 - 1.0 K/uL Final   Eosinophils Relative 04/15/2021 3  % Final   Eosinophils Absolute 04/15/2021 0.1  0.0 - 0.5 K/uL Final   Basophils Relative 04/15/2021 1  % Final   Basophils Absolute 04/15/2021 0.1  0.0 - 0.1 K/uL Final   Immature Granulocytes 04/15/2021 0  % Final   Abs Immature Granulocytes 04/15/2021 0.01  0.00 - 0.07 K/uL Final   Performed at [facility], [place], [place] [address]   Sodium 04/15/2021 135  135 - 145  mmol/L Final   Potassium 04/15/2021 4.2  3.5 - 5.1 mmol/L Final   Chloride 04/15/2021 101  98 - 111 mmol/L Final   CO2 04/15/2021 26  22 - 32 mmol/L Final   Glucose, Bld 04/15/2021 114 (H)  70 - 99 mg/dL Final   Glucose reference range applies only to samples taken after fasting for at least 8 hours.   BUN 04/15/2021 19  8 - 23 mg/dL Final   Creatinine, Ser 04/15/2021 0.90  0.44 - 1.00 mg/dL Final   Calcium 04/15/2021 8.9  8.9 - 10.3 mg/dL Final   Total Protein 04/15/2021 7.1  6.5 - 8.1 g/dL Final   Albumin 04/15/2021 4.1  3.5 - 5.0 g/dL Final   AST 04/15/2021 27  15 - 41 U/L Final   ALT 04/15/2021 20  0 - 44 U/L Final   Alkaline Phosphatase 04/15/2021 87  38 - 126 U/L Final   Total Bilirubin 04/15/2021 0.5  0.3 - 1.2 mg/dL Final   GFR, Estimated 04/15/2021 >60  >60 mL/min Final   Comment: (NOTE) Calculated using the CKD-EPI Creatinine Equation (2021)    Anion gap 04/15/2021 8  5 - 15 Final   Performed at [facility], [place], [place] [address]   Color, Urine 04/15/2021 YELLOW (A)  YELLOW Final   APPearance 04/15/2021 CLEAR (A)  CLEAR Final   Specific Gravity, Urine 04/15/2021 1.013  1.005 - 1.030 Final   pH 04/15/2021 6.0  5.0 - 8.0 Final   Glucose, UA 04/15/2021 NEGATIVE  NEGATIVE mg/dL Final   Hgb urine dipstick 04/15/2021 NEGATIVE  NEGATIVE Final   Bilirubin Urine 04/15/2021 NEGATIVE  NEGATIVE Final   Ketones, ur 04/15/2021 NEGATIVE  NEGATIVE mg/dL Final   Protein, ur 04/15/2021 NEGATIVE  NEGATIVE mg/dL Final   Nitrite 04/15/2021 NEGATIVE  NEGATIVE Final   Leukocytes,Ua 04/15/2021 SMALL (A)  NEGATIVE Final   RBC / HPF 04/15/2021 0-5  0 - 5 RBC/hpf Final   WBC, UA 04/15/2021 0-5  0 - 5 WBC/hpf Final   Bacteria, UA 04/15/2021 RARE (A)  NONE SEEN Final   Squamous Epithelial / LPF 04/15/2021 0-5  0 - 5 Final   Mucus 04/15/2021 PRESENT   Final   Performed at [facility], [address] [place], [place] [address]   ABO/RH(D) 04/15/2021 O POS    Final   Antibody Screen 04/15/2021 NEG   Final   Sample Expiration 04/15/2021 04/29/2021,2359   Final   Extend sample reason 04/15/2021    Final                   Value:NO TRANSFUSIONS OR PREGNANCY IN THE PAST 3 MONTHS Performed at [facility], [address] [place], [place] [address]    MRSA, PCR 04/15/2021 NEGATIVE  NEGATIVE Final   Staphylococcus aureus 04/15/2021 NEGATIVE  NEGATIVE Final   Comment: (NOTE) The Xpert SA Assay (FDA approved for NASAL specimens in patients 72 years of age and older), is one component of a comprehensive surveillance program. It is not intended to diagnose infection nor to guide or monitor treatment. Performed at [facility], [address] [place], [place] [address]     ECG: Date: 04/15/2021 Time ECG obtained: 1007 AM Rate: 56 bpm Rhythm: sinus bradycardia Axis (leads I and aVF): Normal Intervals: PR 162 ms. QRS 90 ms. QTc 422 ms. ST segment and T wave changes: No evidence of acute ST segment elevation or depression Comparison: Similar to previous tracing obtained on 09/08/2020   IMAGING / PROCEDURES: DIAGNOSTIC RADIOGRAPHS OF LEFT KNEES 3 VIEWS performed on 02/10/2021  Patient has no evidence of acute bony abnormality or abnormal bony lesions.   Total knee prosthesis is intact with no evidence of loosening or subsidence.   There is good cement mantle. Patella tracking well.  CT KNEE LEFT WITHOUT CONTRAST performed on 11/24/2020 Tricompartmental osteoarthritis of the LEFT knee most severe in the medial compartment with 5 mm osteochondral defect along the posterior weightbearing medial femoral condyle. Moderate-sized joint effusion   CT LUMBAR SPINE WITHOUT  CONTRAST performed on 08/23/2020 Acute L2 superior endplate fracture with [id number] vertebral body height loss.  No appreciable bony retropulsion. Multilevel degenerative change with likely severe canal stenosis at L3-L4, at least moderate canal stenosis at L4-L5, and  potentially severe left foraminal stenosis at L4-L5.  An MRI could better characterize the canal/cord/foramina if clinically indicated Grade 1 anterolisthesis of L5 on S1, most likely degenerative given severe facet arthropathy at this level Diffuse osteopenia  MYOCARDIAL PERFUSION IMAGING STUDY (LEXISCAN) performed on 07/23/2018 LVEF 58% Regional wall motion reveals normal myocardial thickening and wall motion No artifacts noted Left ventricular cavity size normal No evidence of stress-induced myocardial ischemia or arrhythmia The overall quality of the study is good   TRANSTHORACIC ECHOCARDIOGRAM performed on 09/22/2017 1.  Normal left ventricular systolic function with estimated EF of 55% 2.  Mild aortic valve calcification 3.  Mild mitral valve regurgitation  Impression and Plan:  Greisy Derflinger has been referred for pre-anesthesia review and clearance prior to her undergoing the planned anesthetic and procedural courses. Available labs, pertinent testing, and imaging results were personally reviewed by me. This patient has been appropriately cleared by cardiology with an overall LOW risk of significant perioperative cardiovascular complications.  Based on clinical review performed today (04/20/21), barring any significant acute changes in the patient's overall condition, it is anticipated that she will be able to proceed with the planned surgical intervention. Any acute changes in clinical condition may necessitate her procedure being postponed and/or cancelled. Patient will meet with anesthesia team (MD and/or CRNA) on the day of her procedure for preoperative evaluation/assessment. Questions regarding anesthetic course will be fielded at that time.   Pre-surgical instructions were reviewed with the patient during her PAT appointment and questions were fielded by PAT clinical staff. Patient was advised that if any questions or concerns arise prior to her procedure then she should return a  call to PAT and/or her surgeon's office to discuss.  Honor Loh, MSN, APRN, FNP-C, CEN [facility]  Peri-operative Services Nurse Practitioner Phone: [phone] Fax: [phone] 04/20/21 2:52 PM  NOTE: This note has been prepared using Dragon dictation software. Despite my best ability to proofread, there is always the potential that unintentional transcriptional errors may still occur from this process.

## 2021-04-17 LAB — URINE CULTURE

## 2021-04-23 ENCOUNTER — Other Ambulatory Visit: Payer: Self-pay

## 2021-04-23 ENCOUNTER — Other Ambulatory Visit
Admission: RE | Admit: 2021-04-23 | Discharge: 2021-04-23 | Disposition: A | Discharge status: Home / Self Care | Payer: Medicare Other | Source: Ambulatory Visit | Attending: Orthopedic Surgery | Admitting: Orthopedic Surgery

## 2021-04-23 DIAGNOSIS — Z01812 Encounter for preprocedural laboratory examination: Secondary | ICD-10-CM | POA: Insufficient documentation

## 2021-04-23 DIAGNOSIS — Z20822 Contact with and (suspected) exposure to covid-19: Secondary | ICD-10-CM | POA: Insufficient documentation

## 2021-04-23 LAB — SARS CORONAVIRUS 2 (TAT 6-24 HRS): SARS Coronavirus 2: NEGATIVE

## 2021-04-27 ENCOUNTER — Ambulatory Visit: Payer: Medicare Other | Admitting: Urgent Care

## 2021-04-27 ENCOUNTER — Encounter: Payer: Self-pay | Admitting: Orthopedic Surgery

## 2021-04-27 ENCOUNTER — Other Ambulatory Visit: Payer: Self-pay

## 2021-04-27 ENCOUNTER — Observation Stay
Admission: RE | Admit: 2021-04-27 | Discharge: 2021-04-28 | Disposition: A | Discharge status: Home / Self Care | Payer: Medicare Other | Attending: Orthopedic Surgery | Admitting: Orthopedic Surgery

## 2021-04-27 ENCOUNTER — Encounter
Admission: RE | Disposition: A | Discharge status: Home / Self Care | Payer: Self-pay | Source: Home / Self Care | Attending: Orthopedic Surgery

## 2021-04-27 ENCOUNTER — Observation Stay: Payer: Medicare Other

## 2021-04-27 DIAGNOSIS — M1711 Unilateral primary osteoarthritis, right knee: Principal | ICD-10-CM | POA: Insufficient documentation

## 2021-04-27 DIAGNOSIS — Z96651 Presence of right artificial knee joint: Secondary | ICD-10-CM

## 2021-04-27 DIAGNOSIS — Z7984 Long term (current) use of oral hypoglycemic drugs: Secondary | ICD-10-CM | POA: Diagnosis not present

## 2021-04-27 DIAGNOSIS — Z96643 Presence of artificial hip joint, bilateral: Secondary | ICD-10-CM | POA: Diagnosis not present

## 2021-04-27 DIAGNOSIS — Z96652 Presence of left artificial knee joint: Secondary | ICD-10-CM | POA: Diagnosis not present

## 2021-04-27 DIAGNOSIS — I129 Hypertensive chronic kidney disease with stage 1 through stage 4 chronic kidney disease, or unspecified chronic kidney disease: Secondary | ICD-10-CM | POA: Insufficient documentation

## 2021-04-27 DIAGNOSIS — Z8616 Personal history of COVID-19: Secondary | ICD-10-CM | POA: Diagnosis not present

## 2021-04-27 DIAGNOSIS — E1122 Type 2 diabetes mellitus with diabetic chronic kidney disease: Secondary | ICD-10-CM | POA: Diagnosis not present

## 2021-04-27 DIAGNOSIS — G8918 Other acute postprocedural pain: Secondary | ICD-10-CM

## 2021-04-27 DIAGNOSIS — Z79899 Other long term (current) drug therapy: Secondary | ICD-10-CM | POA: Diagnosis not present

## 2021-04-27 DIAGNOSIS — N1831 Chronic kidney disease, stage 3a: Secondary | ICD-10-CM | POA: Diagnosis not present

## 2021-04-27 DIAGNOSIS — Z7982 Long term (current) use of aspirin: Secondary | ICD-10-CM | POA: Diagnosis not present

## 2021-04-27 HISTORY — PX: TOTAL KNEE ARTHROPLASTY: SHX125

## 2021-04-27 LAB — CREATININE, SERUM
Creatinine, Ser: 0.83 mg/dL (ref 0.44–1.00)
GFR, Estimated: >60 mL/min (ref >60)

## 2021-04-27 LAB — CBC
HCT: 31.6 % — ABNORMAL LOW (ref 36.0–46.0)
Hemoglobin: 9.8 g/dL — ABNORMAL LOW (ref 12.0–15.0)
MCH: 27.8 pg (ref 26.0–34.0)
MCHC: 31 g/dL (ref 30.0–36.0)
MCV: 89.5 fL (ref 80.0–100.0)
Platelets: 249 10*3/uL (ref 150–400)
RBC: 3.53 MIL/uL — ABNORMAL LOW (ref 3.87–5.11)
RDW: 14.3 % (ref 11.5–15.5)
WBC: 6.8 10*3/uL (ref 4.0–10.5)
nRBC: 0 % (ref 0.0–0.2)

## 2021-04-27 LAB — GLUCOSE, CAPILLARY
Glucose-Capillary: 118 mg/dL — ABNORMAL HIGH (ref 70–99)
Glucose-Capillary: 157 mg/dL — ABNORMAL HIGH (ref 70–99)
Glucose-Capillary: 159 mg/dL — ABNORMAL HIGH (ref 70–99)
Glucose-Capillary: 170 mg/dL — ABNORMAL HIGH (ref 70–99)
Glucose-Capillary: 154 mg/dL — ABNORMAL HIGH (ref 70–99)

## 2021-04-27 SURGERY — ARTHROPLASTY, KNEE, TOTAL
Anesthesia: Spinal | Site: Knee | Laterality: Right

## 2021-04-27 MED ORDER — ONDANSETRON HCL 4 MG PO TABS: 4.0000 mg | ORAL_TABLET | Freq: Four times a day (QID) | ORAL | Status: DC | PRN

## 2021-04-27 MED ORDER — HYDROCODONE-ACETAMINOPHEN 5-325 MG PO TABS
1.0000 | ORAL_TABLET | ORAL | Status: DC | PRN
  Administered 2021-04-27: 2 via ORAL

## 2021-04-27 MED ORDER — SODIUM CHLORIDE 0.9 % IR SOLN
Status: DC | PRN
  Administered 2021-04-27: 3012 mL

## 2021-04-27 MED ORDER — AMLODIPINE BESYLATE 5 MG PO TABS
5.0000 mg | ORAL_TABLET | Freq: Every day | ORAL | Status: DC
  Administered 2021-04-28: 5 mg via ORAL
  Filled 2021-04-27: qty 1

## 2021-04-27 MED ORDER — GLYCOPYRROLATE 0.2 MG/ML IJ SOLN
INTRAMUSCULAR | Status: DC | PRN
  Administered 2021-04-27: .2 mg via INTRAVENOUS

## 2021-04-27 MED ORDER — ACETAMINOPHEN 325 MG PO TABS: 325.0000 mg | ORAL_TABLET | Freq: Four times a day (QID) | ORAL | Status: DC | PRN

## 2021-04-27 MED ORDER — PHENYLEPHRINE HCL (PRESSORS) 10 MG/ML IV SOLN
INTRAVENOUS | Status: DC | PRN
  Administered 2021-04-27 (×2): 150 ug via INTRAVENOUS
  Administered 2021-04-27: 100 ug via INTRAVENOUS
  Administered 2021-04-27 (×4): 50 ug via INTRAVENOUS

## 2021-04-27 MED ORDER — TRANEXAMIC ACID-NACL 1000-0.7 MG/100ML-% IV SOLN
INTRAVENOUS | Status: DC | PRN
  Administered 2021-04-27: 1000 mg via INTRAVENOUS

## 2021-04-27 MED ORDER — SODIUM CHLORIDE (PF) 0.9 % IJ SOLN
INTRAMUSCULAR | Status: DC | PRN
  Administered 2021-04-27: 91 mL

## 2021-04-27 MED ORDER — KETOROLAC TROMETHAMINE 15 MG/ML IJ SOLN
INTRAMUSCULAR | Status: AC
  Filled 2021-04-27: qty 1

## 2021-04-27 MED ORDER — DIPHENHYDRAMINE HCL 12.5 MG/5ML PO ELIX
12.5000 mg | ORAL_SOLUTION | ORAL | Status: DC | PRN
  Filled 2021-04-27: qty 10

## 2021-04-27 MED ORDER — HYDRALAZINE HCL 50 MG PO TABS
50.0000 mg | ORAL_TABLET | Freq: Three times a day (TID) | ORAL | Status: DC
  Administered 2021-04-27 – 2021-04-28 (×3): 50 mg via ORAL
  Filled 2021-04-27 (×5): qty 1

## 2021-04-27 MED ORDER — ONDANSETRON HCL 4 MG/2ML IJ SOLN
4.0000 mg | Freq: Four times a day (QID) | INTRAMUSCULAR | Status: DC | PRN
  Administered 2021-04-27: 4 mg via INTRAVENOUS

## 2021-04-27 MED ORDER — MORPHINE SULFATE (PF) 4 MG/ML IV SOLN
INTRAVENOUS | Status: AC
  Filled 2021-04-27: qty 1

## 2021-04-27 MED ORDER — HYDROCODONE-ACETAMINOPHEN 7.5-325 MG PO TABS
ORAL_TABLET | ORAL | Status: AC
  Filled 2021-04-27: qty 2

## 2021-04-27 MED ORDER — ORAL CARE MOUTH RINSE: 15.0000 mL | Freq: Once | OROMUCOSAL | Status: AC

## 2021-04-27 MED ORDER — TRANEXAMIC ACID-NACL 1000-0.7 MG/100ML-% IV SOLN
INTRAVENOUS | Status: AC
  Filled 2021-04-27: qty 100

## 2021-04-27 MED ORDER — FLEET ENEMA 7-19 GM/118ML RE ENEM: 1.0000 | ENEMA | Freq: Once | RECTAL | Status: DC | PRN

## 2021-04-27 MED ORDER — HYDROCODONE-ACETAMINOPHEN 7.5-325 MG PO TABS
1.0000 | ORAL_TABLET | ORAL | Status: DC | PRN
  Administered 2021-04-27: 2 via ORAL
  Administered 2021-04-27: 1 via ORAL
  Administered 2021-04-28: 2 via ORAL

## 2021-04-27 MED ORDER — INSULIN ASPART 100 UNIT/ML IJ SOLN: 0.0000 [IU] | Freq: Three times a day (TID) | INTRAMUSCULAR | Status: DC

## 2021-04-27 MED ORDER — SURGIPHOR WOUND IRRIGATION SYSTEM - OPTIME
TOPICAL | Status: DC | PRN
  Administered 2021-04-27: 1

## 2021-04-27 MED ORDER — SODIUM CHLORIDE 0.9 % IV SOLN: INTRAVENOUS | Status: DC

## 2021-04-27 MED ORDER — DOCUSATE SODIUM 100 MG PO CAPS
100.0000 mg | ORAL_CAPSULE | Freq: Two times a day (BID) | ORAL | Status: DC
  Administered 2021-04-28: 100 mg via ORAL

## 2021-04-27 MED ORDER — INSULIN ASPART 100 UNIT/ML IJ SOLN
INTRAMUSCULAR | Status: AC
  Administered 2021-04-27: 3 [IU] via SUBCUTANEOUS
  Filled 2021-04-27: qty 1

## 2021-04-27 MED ORDER — ONDANSETRON HCL 4 MG/2ML IJ SOLN: 4.0000 mg | Freq: Once | INTRAMUSCULAR | Status: DC | PRN

## 2021-04-27 MED ORDER — BUPIVACAINE HCL (PF) 0.5 % IJ SOLN
INTRAMUSCULAR | Status: AC
  Filled 2021-04-27: qty 10

## 2021-04-27 MED ORDER — INSULIN ASPART 100 UNIT/ML IJ SOLN
INTRAMUSCULAR | Status: AC
  Filled 2021-04-27: qty 1

## 2021-04-27 MED ORDER — ATORVASTATIN CALCIUM 20 MG PO TABS
40.0000 mg | ORAL_TABLET | Freq: Every day | ORAL | Status: DC
  Administered 2021-04-27: 40 mg via ORAL
  Filled 2021-04-27 (×2): qty 2

## 2021-04-27 MED ORDER — CHLORHEXIDINE GLUCONATE 0.12 % MT SOLN
OROMUCOSAL | Status: AC
  Administered 2021-04-27: 15 mL via OROMUCOSAL
  Filled 2021-04-27: qty 15

## 2021-04-27 MED ORDER — CEFAZOLIN SODIUM-DEXTROSE 2-4 GM/100ML-% IV SOLN
INTRAVENOUS | Status: AC
  Administered 2021-04-27: 2 g via INTRAVENOUS
  Filled 2021-04-27: qty 100

## 2021-04-27 MED ORDER — KETOROLAC TROMETHAMINE 15 MG/ML IJ SOLN
15.0000 mg | Freq: Three times a day (TID) | INTRAMUSCULAR | Status: DC | PRN
  Administered 2021-04-27 – 2021-04-28 (×2): 15 mg via INTRAVENOUS

## 2021-04-27 MED ORDER — FENTANYL CITRATE (PF) 100 MCG/2ML IJ SOLN
INTRAMUSCULAR | Status: AC
  Filled 2021-04-27: qty 2

## 2021-04-27 MED ORDER — POLYETHYLENE GLYCOL 3350 17 G PO PACK
17.0000 g | PACK | Freq: Every day | ORAL | Status: DC | PRN
  Filled 2021-04-27: qty 1

## 2021-04-27 MED ORDER — ONDANSETRON HCL 4 MG/2ML IJ SOLN
INTRAMUSCULAR | Status: AC
  Filled 2021-04-27: qty 2

## 2021-04-27 MED ORDER — GLYCOPYRROLATE 0.2 MG/ML IJ SOLN
INTRAMUSCULAR | Status: AC
  Filled 2021-04-27: qty 1

## 2021-04-27 MED ORDER — PROPOFOL 10 MG/ML IV BOLUS
INTRAVENOUS | Status: DC | PRN
  Administered 2021-04-27 (×3): 10 mg via INTRAVENOUS
  Administered 2021-04-27: 20 mg via INTRAVENOUS

## 2021-04-27 MED ORDER — VITAMIN D3 25 MCG (1000 UNIT) PO TABS
1000.0000 [IU] | ORAL_TABLET | Freq: Every day | ORAL | Status: DC
  Administered 2021-04-28: 1000 [IU] via ORAL
  Filled 2021-04-27: qty 1

## 2021-04-27 MED ORDER — PROPOFOL 1000 MG/100ML IV EMUL
INTRAVENOUS | Status: AC
  Filled 2021-04-27: qty 100

## 2021-04-27 MED ORDER — BUPIVACAINE HCL (PF) 0.5 % IJ SOLN
INTRAMUSCULAR | Status: DC | PRN
  Administered 2021-04-27: 2.5 mL via INTRATHECAL

## 2021-04-27 MED ORDER — MORPHINE SULFATE (PF) 4 MG/ML IV SOLN
0.5000 mg | INTRAVENOUS | Status: DC | PRN
  Administered 2021-04-27: 1 mg via INTRAVENOUS

## 2021-04-27 MED ORDER — BUPIVACAINE-EPINEPHRINE (PF) 0.25% -1:200000 IJ SOLN
INTRAMUSCULAR | Status: AC
  Filled 2021-04-27: qty 30

## 2021-04-27 MED ORDER — ADULT MULTIVITAMIN W/MINERALS CH
1.0000 | ORAL_TABLET | Freq: Every day | ORAL | Status: DC
  Administered 2021-04-28: 1 via ORAL
  Filled 2021-04-27: qty 1

## 2021-04-27 MED ORDER — MORPHINE SULFATE (PF) 4 MG/ML IV SOLN
INTRAVENOUS | Status: AC
  Administered 2021-04-27: 1 mg via INTRAVENOUS
  Filled 2021-04-27: qty 1

## 2021-04-27 MED ORDER — ZOLPIDEM TARTRATE 5 MG PO TABS: 5.0000 mg | ORAL_TABLET | Freq: Every evening | ORAL | Status: DC | PRN

## 2021-04-27 MED ORDER — METHOCARBAMOL 1000 MG/10ML IJ SOLN
500.0000 mg | Freq: Four times a day (QID) | INTRAVENOUS | Status: DC | PRN
  Filled 2021-04-27: qty 5

## 2021-04-27 MED ORDER — METHOCARBAMOL 500 MG PO TABS
500.0000 mg | ORAL_TABLET | Freq: Four times a day (QID) | ORAL | Status: DC | PRN
  Administered 2021-04-27 (×2): 500 mg via ORAL

## 2021-04-27 MED ORDER — FENTANYL CITRATE (PF) 100 MCG/2ML IJ SOLN: 25.0000 ug | INTRAMUSCULAR | Status: DC | PRN

## 2021-04-27 MED ORDER — METOCLOPRAMIDE HCL 10 MG PO TABS: 5.0000 mg | ORAL_TABLET | Freq: Three times a day (TID) | ORAL | Status: DC | PRN

## 2021-04-27 MED ORDER — PANTOPRAZOLE SODIUM 40 MG PO TBEC
80.0000 mg | DELAYED_RELEASE_TABLET | Freq: Every day | ORAL | Status: DC
  Administered 2021-04-28: 80 mg via ORAL
  Filled 2021-04-27: qty 2

## 2021-04-27 MED ORDER — CEFAZOLIN SODIUM-DEXTROSE 2-4 GM/100ML-% IV SOLN
INTRAVENOUS | Status: AC
  Filled 2021-04-27: qty 100

## 2021-04-27 MED ORDER — LIDOCAINE HCL (PF) 2 % IJ SOLN
INTRAMUSCULAR | Status: AC
  Filled 2021-04-27: qty 5

## 2021-04-27 MED ORDER — EPHEDRINE SULFATE (PRESSORS) 50 MG/ML IJ SOLN
INTRAMUSCULAR | Status: DC | PRN
Start: 2021-04-27 — End: 2021-04-27
  Administered 2021-04-27: 5 mg via INTRAVENOUS
  Administered 2021-04-27 (×2): 7.5 mg via INTRAVENOUS
  Administered 2021-04-27: 5 mg via INTRAVENOUS

## 2021-04-27 MED ORDER — LIDOCAINE HCL (CARDIAC) PF 100 MG/5ML IV SOSY
PREFILLED_SYRINGE | INTRAVENOUS | Status: DC | PRN
  Administered 2021-04-27: 25 mg via INTRAVENOUS

## 2021-04-27 MED ORDER — NEOMYCIN-POLYMYXIN B GU 40-200000 IR SOLN
Status: AC
  Filled 2021-04-27: qty 20

## 2021-04-27 MED ORDER — ALUM & MAG HYDROXIDE-SIMETH 200-200-20 MG/5ML PO SUSP: 30.0000 mL | ORAL | Status: DC | PRN

## 2021-04-27 MED ORDER — METFORMIN HCL ER 500 MG PO TB24
500.0000 mg | ORAL_TABLET | Freq: Every day | ORAL | Status: DC
  Filled 2021-04-27: qty 1

## 2021-04-27 MED ORDER — PHENYLEPHRINE HCL (PRESSORS) 10 MG/ML IV SOLN
INTRAVENOUS | Status: AC
  Filled 2021-04-27: qty 1

## 2021-04-27 MED ORDER — ASPIRIN EC 81 MG PO TBEC
81.0000 mg | DELAYED_RELEASE_TABLET | Freq: Every day | ORAL | Status: DC
  Administered 2021-04-28: 81 mg via ORAL
  Filled 2021-04-27: qty 1

## 2021-04-27 MED ORDER — DULOXETINE HCL 60 MG PO CPEP
120.0000 mg | ORAL_CAPSULE | Freq: Every day | ORAL | Status: DC
  Administered 2021-04-28: 120 mg via ORAL
  Filled 2021-04-27: qty 2

## 2021-04-27 MED ORDER — PROPOFOL 500 MG/50ML IV EMUL
INTRAVENOUS | Status: DC | PRN
  Administered 2021-04-27: 50 ug/kg/min via INTRAVENOUS

## 2021-04-27 MED ORDER — SODIUM CHLORIDE FLUSH 0.9 % IV SOLN
INTRAVENOUS | Status: AC
  Filled 2021-04-27: qty 40

## 2021-04-27 MED ORDER — DEXAMETHASONE SODIUM PHOSPHATE 10 MG/ML IJ SOLN
INTRAMUSCULAR | Status: AC
  Filled 2021-04-27: qty 1

## 2021-04-27 MED ORDER — CEFAZOLIN SODIUM-DEXTROSE 2-4 GM/100ML-% IV SOLN
2.0000 g | Freq: Four times a day (QID) | INTRAVENOUS | Status: AC
  Administered 2021-04-27: 2 g via INTRAVENOUS

## 2021-04-27 MED ORDER — METHOCARBAMOL 500 MG PO TABS
ORAL_TABLET | ORAL | Status: AC
  Filled 2021-04-27: qty 1

## 2021-04-27 MED ORDER — PHENOL 1.4 % MT LIQD
1.0000 | OROMUCOSAL | Status: DC | PRN
  Filled 2021-04-27: qty 177

## 2021-04-27 MED ORDER — SEVOFLURANE IN SOLN
RESPIRATORY_TRACT | Status: AC
  Filled 2021-04-27: qty 250

## 2021-04-27 MED ORDER — BISACODYL 5 MG PO TBEC
5.0000 mg | DELAYED_RELEASE_TABLET | Freq: Every day | ORAL | Status: DC | PRN
  Filled 2021-04-27: qty 1

## 2021-04-27 MED ORDER — METOCLOPRAMIDE HCL 5 MG/ML IJ SOLN: 5.0000 mg | Freq: Three times a day (TID) | INTRAMUSCULAR | Status: DC | PRN

## 2021-04-27 MED ORDER — CHLORHEXIDINE GLUCONATE 0.12 % MT SOLN: 15.0000 mL | Freq: Once | OROMUCOSAL | Status: AC

## 2021-04-27 MED ORDER — EPHEDRINE 5 MG/ML INJ
INTRAVENOUS | Status: AC
  Filled 2021-04-27: qty 5

## 2021-04-27 MED ORDER — HYDROCODONE-ACETAMINOPHEN 7.5-325 MG PO TABS
ORAL_TABLET | ORAL | Status: AC
  Filled 2021-04-27: qty 1

## 2021-04-27 MED ORDER — FAMOTIDINE 20 MG PO TABS
ORAL_TABLET | ORAL | Status: AC
  Filled 2021-04-27: qty 2

## 2021-04-27 MED ORDER — MENTHOL 3 MG MT LOZG
1.0000 | LOZENGE | OROMUCOSAL | Status: DC | PRN
  Filled 2021-04-27: qty 9

## 2021-04-27 MED ORDER — ENOXAPARIN SODIUM 30 MG/0.3ML IJ SOSY
30.0000 mg | PREFILLED_SYRINGE | Freq: Two times a day (BID) | INTRAMUSCULAR | Status: DC
  Administered 2021-04-28: 30 mg via SUBCUTANEOUS
  Filled 2021-04-27 (×2): qty 0.3

## 2021-04-27 MED ORDER — DEXAMETHASONE SODIUM PHOSPHATE 10 MG/ML IJ SOLN
INTRAMUSCULAR | Status: DC | PRN
Start: 2021-04-27 — End: 2021-04-27
  Administered 2021-04-27: 5 mg via INTRAVENOUS

## 2021-04-27 MED ORDER — SODIUM CHLORIDE 0.9 % IR SOLN
Status: DC | PRN
  Administered 2021-04-27: 300 mL

## 2021-04-27 MED ORDER — CEFAZOLIN SODIUM-DEXTROSE 2-4 GM/100ML-% IV SOLN
2.0000 g | INTRAVENOUS | Status: AC
  Administered 2021-04-27: 2 g via INTRAVENOUS

## 2021-04-27 MED ORDER — ACETAMINOPHEN 10 MG/ML IV SOLN
INTRAVENOUS | Status: DC | PRN
  Administered 2021-04-27: 1000 mg via INTRAVENOUS

## 2021-04-27 MED ORDER — LISINOPRIL 20 MG PO TABS
40.0000 mg | ORAL_TABLET | Freq: Every day | ORAL | Status: DC
  Administered 2021-04-27 – 2021-04-28 (×2): 40 mg via ORAL
  Filled 2021-04-27 (×2): qty 2

## 2021-04-27 MED ORDER — MORPHINE SULFATE (PF) 10 MG/ML IV SOLN
INTRAVENOUS | Status: AC
  Filled 2021-04-27: qty 1

## 2021-04-27 MED ORDER — ONDANSETRON HCL 4 MG/2ML IJ SOLN
INTRAMUSCULAR | Status: DC | PRN
  Administered 2021-04-27: 4 mg via INTRAVENOUS

## 2021-04-27 MED ORDER — HYDROCODONE-ACETAMINOPHEN 5-325 MG PO TABS
ORAL_TABLET | ORAL | Status: AC
  Filled 2021-04-27: qty 2

## 2021-04-27 MED ORDER — ACETAMINOPHEN 10 MG/ML IV SOLN
INTRAVENOUS | Status: AC
  Filled 2021-04-27: qty 100

## 2021-04-27 MED ORDER — FAMOTIDINE 20 MG PO TABS
40.0000 mg | ORAL_TABLET | Freq: Every day | ORAL | Status: DC
  Administered 2021-04-27: 40 mg via ORAL

## 2021-04-27 MED ORDER — MORPHINE SULFATE (PF) 2 MG/ML IV SOLN
INTRAVENOUS | Status: AC
  Administered 2021-04-27: 1 mg via INTRAVENOUS
  Filled 2021-04-27: qty 1

## 2021-04-27 MED ORDER — BUPIVACAINE LIPOSOME 1.3 % IJ SUSP
INTRAMUSCULAR | Status: AC
  Filled 2021-04-27: qty 20

## 2021-04-27 MED ORDER — MAGNESIUM OXIDE -MG SUPPLEMENT 400 (240 MG) MG PO TABS
200.0000 mg | ORAL_TABLET | Freq: Every day | ORAL | Status: DC
  Administered 2021-04-28: 200 mg via ORAL
  Filled 2021-04-27: qty 0.5

## 2021-04-27 SURGICAL SUPPLY — 66 items
BLADE SAGITTAL 25.0X1.19X90 (BLADE) ×2 IMPLANT
BLADE SAW 90X13X1.19 OSCILLAT (BLADE) ×1 IMPLANT
BNDG ELASTIC 6X5.8 VLCR STR LF (GAUZE/BANDAGES/DRESSINGS) ×2 IMPLANT
CANISTER WOUND CARE 500ML ATS (WOUND CARE) ×2 IMPLANT
CEMENT HV SMART SET (Cement) ×4 IMPLANT
CHLORAPREP W/TINT 26 (MISCELLANEOUS) ×4 IMPLANT
COOLER POLAR GLACIER W/PUMP (MISCELLANEOUS) ×2 IMPLANT
CUFF TOURN SGL QUICK 24 (TOURNIQUET CUFF)
CUFF TOURN SGL QUICK 34 (TOURNIQUET CUFF)
CUFF TRNQT CYL 24X4X16.5-23 (TOURNIQUET CUFF) IMPLANT
CUFF TRNQT CYL 34X4.125X (TOURNIQUET CUFF) IMPLANT
DRAPE 3/4 80X56 (DRAPES) ×4 IMPLANT
DRSG MEPILEX SACRM 8.7X9.8 (GAUZE/BANDAGES/DRESSINGS) ×2 IMPLANT
ELECT CAUTERY BLADE 6.4 (BLADE) ×2 IMPLANT
ELECT REM PT RETURN 9FT ADLT (ELECTROSURGICAL) ×2
ELECTRODE REM PT RTRN 9FT ADLT (ELECTROSURGICAL) ×1 IMPLANT
FEMORAL COMP SZ4 RIGHT SPHERE (Femur) ×1 IMPLANT
GAUZE SPONGE 4X4 12PLY STRL (GAUZE/BANDAGES/DRESSINGS) ×1 IMPLANT
GAUZE XEROFORM 1X8 LF (GAUZE/BANDAGES/DRESSINGS) ×1 IMPLANT
GLOVE SURG ORTHO LTX SZ8 (GLOVE) ×2 IMPLANT
GLOVE SURG SYN 9.0  PF PI (GLOVE) ×1
GLOVE SURG SYN 9.0 PF PI (GLOVE) ×1 IMPLANT
GLOVE SURG UNDER LTX SZ8 (GLOVE) ×2 IMPLANT
GLOVE SURG UNDER POLY LF SZ9 (GLOVE) ×2 IMPLANT
GOWN SRG 2XL LVL 4 RGLN SLV (GOWNS) ×1 IMPLANT
GOWN STRL NON-REIN 2XL LVL4 (GOWNS) ×1
GOWN STRL REUS W/ TWL LRG LVL3 (GOWN DISPOSABLE) ×1 IMPLANT
GOWN STRL REUS W/ TWL XL LVL3 (GOWN DISPOSABLE) ×1 IMPLANT
GOWN STRL REUS W/TWL LRG LVL3 (GOWN DISPOSABLE) ×1
GOWN STRL REUS W/TWL XL LVL3 (GOWN DISPOSABLE)
HOLDER FOLEY CATH W/STRAP (MISCELLANEOUS) ×2 IMPLANT
INSERT TIBIAL SZ4 RIGHT 10MM (Insert) ×1 IMPLANT
IV NS IRRIG 3000ML ARTHROMATIC (IV SOLUTION) ×2 IMPLANT
KIT PREVENA INCISION MGT20CM45 (CANNISTER) ×2 IMPLANT
KIT TURNOVER KIT A (KITS) ×2 IMPLANT
MANIFOLD NEPTUNE II (INSTRUMENTS) ×2 IMPLANT
NDL SAFETY ECLIPSE 18X1.5 (NEEDLE) ×1 IMPLANT
NDL SPNL 18GX3.5 QUINCKE PK (NEEDLE) ×1 IMPLANT
NDL SPNL 20GX3.5 QUINCKE YW (NEEDLE) ×1 IMPLANT
NEEDLE HYPO 18GX1.5 SHARP (NEEDLE) ×1
NEEDLE SPNL 18GX3.5 QUINCKE PK (NEEDLE) IMPLANT
NEEDLE SPNL 20GX3.5 QUINCKE YW (NEEDLE) ×2 IMPLANT
NS IRRIG 500ML POUR BTL (IV SOLUTION) ×1 IMPLANT
PACK TOTAL KNEE (MISCELLANEOUS) ×2 IMPLANT
PAD WRAPON POLAR KNEE (MISCELLANEOUS) ×1 IMPLANT
PATELLA RESURFACING MEDACTA 02 (Bone Implant) ×1 IMPLANT
PENCIL SMOKE EVACUATOR COATED (MISCELLANEOUS) ×1 IMPLANT
PULSAVAC PLUS IRRIG FAN TIP (DISPOSABLE) ×2
SCALPEL PROTECTED #10 DISP (BLADE) ×4 IMPLANT
STAPLER SKIN PROX 35W (STAPLE) ×2 IMPLANT
STEM EXTENSION 11MMX30MM (Stem) ×1 IMPLANT
SUCTION FRAZIER HANDLE 10FR (MISCELLANEOUS) ×1
SUCTION TUBE FRAZIER 10FR DISP (MISCELLANEOUS) ×1 IMPLANT
SUT DVC 2 QUILL PDO  T11 36X36 (SUTURE) ×1
SUT DVC 2 QUILL PDO T11 36X36 (SUTURE) ×1 IMPLANT
SUT ETHIBOND 2 V 37 (SUTURE) IMPLANT
SUT V-LOC 90 ABS DVC 3-0 CL (SUTURE) ×2 IMPLANT
SYR 20ML LL LF (SYRINGE) ×2 IMPLANT
SYR 50ML LL SCALE MARK (SYRINGE) ×4 IMPLANT
TIP FAN IRRIG PULSAVAC PLUS (DISPOSABLE) ×1 IMPLANT
TOWEL OR 17X26 4PK STRL BLUE (TOWEL DISPOSABLE) ×1 IMPLANT
TOWER CARTRIDGE SMART MIX (DISPOSABLE) ×2 IMPLANT
TRAY FOLEY MTR SLVR 16FR STAT (SET/KITS/TRAYS/PACK) ×2 IMPLANT
TRAY TIBIAL FIXED T314 RIGHT (Miscellaneous) ×1 IMPLANT
WATER STERILE IRR 1000ML POUR (IV SOLUTION) ×1 IMPLANT
WRAPON POLAR PAD KNEE (MISCELLANEOUS) ×2

## 2021-04-27 NOTE — Anesthesia Preprocedure Evaluation (Signed)
Anesthesia Evaluation  Patient identified by MRN, date of birth, ID band Patient awake    Reviewed: Allergy & Precautions, H&P , NPO status , Patient's Chart, lab work & pertinent test results, reviewed documented beta blocker date and time   History of Anesthesia Complications (+) PONV and history of anesthetic complications  Airway Mallampati: II   Neck ROM: full    Dental  (+) Poor Dentition   Pulmonary sleep apnea and Continuous Positive Airway Pressure Ventilation ,    Pulmonary exam normal        Cardiovascular Exercise Tolerance: Poor hypertension, + CAD and + Past MI  Normal cardiovascular exam+ Valvular Problems/Murmurs MVP  Rhythm:regular Rate:Normal     Neuro/Psych PSYCHIATRIC DISORDERS Anxiety Depression negative neurological ROS     GI/Hepatic Neg liver ROS, GERD  Medicated,  Endo/Other  negative endocrine ROSdiabetes  Renal/GU Renal disease  negative genitourinary   Musculoskeletal   Abdominal   Peds  Hematology  (+) Blood dyscrasia, anemia ,   Anesthesia Other Findings Past Medical History: No date: Anxiety and depression No date: Aortic stenosis No date: Arthritis No date: Cardiac murmur     Comment:  a.) Grade I/VI medium pitched mid systolic blowing at               lower LSB No date: CKD (chronic kidney disease), stage III (HCC) No date: Coronary artery disease No date: GERD (gastroesophageal reflux disease) 12/29/2020: History of 2019 novel coronavirus disease (COVID-19)     Comment:  a.) asymptomatic; PCR (+) on 12/29/2020 with               confirmatory (+) rapid PCR on 12/30/2020. 2010: History of MI (myocardial infarction) No date: HLD (hyperlipidemia) No date: HTN (hypertension) No date: IDA (iron deficiency anemia) No date: Mild mitral regurgitation 2010: Myocardial infarction [facility])     Comment:  a.) Tx'd at [place]; PCI with stents x 2               (unknown  type and location). No date: OSA on CPAP     Comment:  a.) not complaint with prescribed nocturnal PAP therapy No date: Osteopenia No date: PONV (postoperative nausea and vomiting) No date: T2DM (type 2 diabetes mellitus) ([place]) No date: Vaginal prolapse Past Surgical History: No date: CAROTID STENT 1983: CARPAL TUNNEL RELEASE; Left 2010: CORONARY ANGIOPLASTY WITH STENT PLACEMENT; N/A     Comment:  stents x 2 placed (unknown type and location); Location:              [facility]: CYST EXCISION     Comment:  from Left side of neck No date: EYE SURGERY 2007: JOINT REPLACEMENT; Bilateral     Comment:  hips 09/08/2020: KYPHOPLASTY; N/A     Comment:  Procedure: L2  KYPHOPLASTY;  Surgeon: Hessie Knows, MD;              Location: [facility] ORS;  Service: Orthopedics;  Laterality:               N/A; 01/12/2021: TOTAL KNEE ARTHROPLASTY; Left     Comment:  Procedure: TOTAL KNEE ARTHROPLASTY;  Surgeon: Hessie Knows, MD;  Location: [facility] ORS;  Service: Orthopedics;               Laterality: Left; BMI    Body Mass Index: 32.46 kg/m     Reproductive/Obstetrics negative OB ROS  Anesthesia Physical Anesthesia Plan  ASA: 3  Anesthesia Plan: Spinal   Post-op Pain Management:    Induction:   PONV Risk Score and Plan: 4 or greater  Airway Management Planned:   Additional Equipment:   Intra-op Plan:   Post-operative Plan:   Informed Consent: I have reviewed the patients History and Physical, chart, labs and discussed the procedure including the risks, benefits and alternatives for the proposed anesthesia with the patient or authorized representative who has indicated his/her understanding and acceptance.     Dental Advisory Given  Plan Discussed with: CRNA  Anesthesia Plan Comments:         Anesthesia Quick Evaluation

## 2021-04-27 NOTE — Anesthesia Procedure Notes (Signed)
Spinal  Patient location during procedure: OR Start time: 04/27/2021 7:17 AM End time: 04/27/2021 7:18 AM Reason for block: surgical anesthesia Staffing Performed: other anesthesia staff  Anesthesiologist: Molli Barrows, MD Resident/CRNA: Demetrius Charity, CRNA Other anesthesia staff: Bea Graff, RN Preanesthetic Checklist Completed: patient identified, IV checked, site marked, risks and benefits discussed, surgical consent, monitors and equipment checked, pre-op evaluation and timeout performed Spinal Block Patient position: sitting Prep: ChloraPrep Patient monitoring: heart rate, continuous pulse ox and blood pressure Approach: midline Location: L4-5 Injection technique: single-shot Needle Needle type: Introducer and Pencan  Needle gauge: 24 G Needle length: 9 cm Assessment Events: CSF return Additional Notes Negative paresthesia. Negative blood return. Positive free-flowing CSF. Expiration date of kit checked and confirmed. Patient tolerated procedure well, without complications.

## 2021-04-27 NOTE — H&P (Signed)
Chief Complaint  Patient presents with   Right Knee - Pain  History & Physical Right TKA 04/27/21 Jean Stout   Reason for Visit Jean Stout is a 81 y.o. who presents today for a history and physical. She is to undergo a right total knee arthroplasty on 04/27/2021.  Patient has a chronic history of bilateral knee pain. Was initially treated by her primary care physician Dr. Cherylann Ratel who had done numerous cortisone injections with only short periods of relief. She was also taken tramadol. Patient is also been taking Voltaren 3 times a day as well as using heating pad at night.. Patient has previously underwent a left total knee arthroplasty and has done well but has continued to have problems with the right knee. States the pain is increased to the point that is interfering with her activities of daily living and wishes to proceed with having surgery on the right knee.  Past Medical History Past Medical History:  Diagnosis Date   Allergy   Anxiety   Arthritis   Depression   Diabetes mellitus without complication (CMS-HCC)   GERD (gastroesophageal reflux disease)   History of myocardial infarction   Hyperlipidemia   Hypertension   Sleep apnea   Vaginal prolapse   Past Surgical History Past Surgical History:  Procedure Laterality Date   L2 FRACTURE 09/08/2020  Lumbar   ARTHROPLASTY TOTAL KNEE Left 01/12/2021  by Dr. Rosita Stout   CORONARY ANGIOPLASTY   KNEE ARTHROSCOPY   REPLACEMENT TOTAL HIP W/ RESURFACING IMPLANTS Bilateral  Right - January 2007; Left - March 2007   Past Family History Family History  Problem Relation Age of Onset   High blood pressure (Hypertension) Mother   Anxiety Mother   Myocardial Infarction (Heart attack) Father   Sudden cardiac death Father 24   Hyperlipidemia (Elevated cholesterol) Sister   High blood pressure (Hypertension) Sister   Myocardial Infarction (Heart attack) Sister   Myocardial Infarction (Heart attack) Brother   High blood pressure (Hypertension)  Brother   Sudden cardiac death Brother 84   Breast cancer Brother   Testicular cancer Brother   High blood pressure (Hypertension) Sister   Anxiety Sister   Breast cancer Daughter   Myocardial Infarction (Heart attack) Son   Hyperlipidemia (Elevated cholesterol) Son   Coronary Artery Disease (Blocked arteries around heart) Son   Hearing loss Brother   Atrial fibrillation (Abnormal heart rhythm sometimes requiring treatment with blood thinners) Brother   High blood pressure (Hypertension) Brother   Hyperlipidemia (Elevated cholesterol) Brother   Sleep apnea Son   Hyperlipidemia (Elevated cholesterol) Son   Medications Current Outpatient Medications Ordered in Epic  Medication Sig Dispense Refill   amLODIPine (NORVASC) 5 MG tablet TAKE 1 TABLET BY MOUTH ONCE A DAY FOR HYPERTENSION 90 tablet 1   aspirin 81 MG EC tablet Take 1 tablet (81 mg total) by mouth once daily   atorvastatin (LIPITOR) 40 MG tablet TAKE 1 TABLET BY MOUTH AT BEDTIME 90 tablet 1   cholecalciferol (VITAMIN D3) 1000 unit tablet Take by mouth   diclofenac (VOLTAREN) 1 % topical gel Apply 2 g topically 3 (three) times daily   DULoxetine (CYMBALTA) 60 MG DR capsule Take 2 capsules (120 mg total) by mouth once daily 180 capsule 1   famotidine (PEPCID) 40 MG tablet TAKE 1 TABLET BY MOUTH EVERY DAY AT NIGHT 90 tablet 1   glucosamine su 2KCl-chondroit 500-400 mg Tab Take by mouth   hydrALAZINE (APRESOLINE) 50 MG tablet TAKE 1 TABLET BY MOUTH THREE TIMES A  DAY 270 tablet 3   HYDROcodone-acetaminophen (NORCO) 7.5-325 mg tablet Take 1 tablet by mouth every 6 (six) hours as needed for Pain (Patient taking differently: Take 1 tablet by mouth 2 (two) times daily) 30 tablet 0   lisinopriL (ZESTRIL) 40 MG tablet TAKE 1 TABLET BY MOUTH EVERY DAY 90 tablet 1   magnesium oxide 500 mg Cap Take by mouth   metFORMIN (GLUCOPHAGE-XR) 500 MG XR tablet TAKE 1 TABLET BY MOUTH EVERY DAY 90 tablet 1   multivitamin tablet Take 1 tablet by mouth  once daily   omeprazole (PRILOSEC) 20 MG DR capsule Take 1 capsule (20 mg total) by mouth once daily 90 capsule 1   traZODone (DESYREL) 100 MG tablet Take 1 tablet (100 mg total) by mouth at bedtime for 30 days 30 tablet 2   No current Epic-ordered facility-administered medications on file.   Allergies Allergies  Allergen Reactions   Gadolinium-Containing Contrast Media Rash   Iodinated Contrast Media Anaphylaxis   Bextra [Valdecoxib] Rash   Lotrel [Amlodipine-Benazepril] Rash    Review of Systems A comprehensive 14 point ROS was performed, reviewed, and the pertinent orthopaedic findings are documented in the HPI.  Exam BP (!) 144/84 (BP Location: Left upper arm, Patient Position: Sitting, BP Cuff Size: Adult)   Ht 157.5 cm (5\' 2" )   Wt 82.1 kg (181 lb)   LMP (LMP Unknown)   BMI 33.11 kg/m   General: Well-developed well-nourished female seen in no acute distress.   HEENT: Atraumatic,normocephalic. Pupils are equal and reactive to light. Oropharynx is clear with moist mucosa  Lungs: Clear to auscultation bilaterally   Cardiovascular: Regular rate and rhythm. Normal S1, S2. No murmurs. No appreciable gallops or rubs. Peripheral pulses are palpable.  Abdomen: Soft, non-tender, nondistended. Bowel sounds present  Extremity: Patient is noted to ambulate with antalgic gait. She is noted to have some increased valgus deformity. Diffuse swelling was noted. Tenderness to the joint line was noted. Appears to have full extension. Flexion greater than 95 degrees. Does not appear to have any laxity to the collateral or cruciate ligaments.  Neurological:  The patient is alert and oriented Sensation to light touch appears to be intact and within normal limits Gross motor strength appeared to be equal to 5/5  Vascular :  Peripheral pulses felt to be palpable. Capillary refill appears to be intact and within normal limits  X-ray  1. AP standing, lateral and sunrise view of the  right knee taken on 11/02/2020 showed loss of the medial joint space with less osteophytes than on the left knee, complete loss of medial joint space. Lateral shows small osteophytes on the posterior femur and tibia with moderate patellofemoral degenerative change. Sunrise view shows moderate patellofemoral degenerative   Impression  1. Degenerative arthrosis right knee  Plan   1. Patient has discontinued all anticoagulation medication. 2. Did discuss postop rehab course with patient. 3. Patient would like to stay overnight to go home. 4. Return to clinic 2 weeks postop. Sooner if any problems  This note was generated in part with voice recognition software and I apologize for any typographical errors that were not detected and corrected   01/02/2021 PA Electronically signed by Tera Partridge, PA at 04/21/2021 12:23 PM EST  Reviewed  H+P. No changes noted.

## 2021-04-27 NOTE — Transfer of Care (Signed)
Immediate Anesthesia Transfer of Care Note  Patient: Jean Stout  Procedure(s) Performed: TOTAL KNEE ARTHROPLASTY (Right: Knee)  Patient Location: PACU  Anesthesia Type:Spinal  Level of Consciousness: awake and alert   Airway & Oxygen Therapy: Patient Spontanous Breathing  Post-op Assessment: Report given to RN and Post -op Vital signs reviewed and stable  Post vital signs: Reviewed and stable  Last Vitals:  Vitals Value Taken Time  BP 138/71 04/27/21 0910  Temp 36.5 C 04/27/21 0910  Pulse 75 04/27/21 0912  Resp 23 04/27/21 0912  SpO2 97 % 04/27/21 0912  Vitals shown include unvalidated device data.  Last Pain:  Vitals:   04/27/21 0617  TempSrc: Temporal  PainSc: 5          Complications: No notable events documented.

## 2021-04-27 NOTE — Anesthesia Postprocedure Evaluation (Signed)
Anesthesia Post Note  Patient: Jean Stout  Procedure(s) Performed: TOTAL KNEE ARTHROPLASTY (Right: Knee)  Patient location during evaluation: PACU Anesthesia Type: Spinal Level of consciousness: awake and alert Pain management: pain level controlled Vital Signs Assessment: post-procedure vital signs reviewed and stable Respiratory status: spontaneous breathing, nonlabored ventilation, respiratory function stable and patient connected to nasal cannula oxygen Cardiovascular status: blood pressure returned to baseline and stable Postop Assessment: no apparent nausea or vomiting Anesthetic complications: no   No notable events documented.   Last Vitals:  Vitals:   04/27/21 1017 04/27/21 1200  BP: (!) 191/73 (!) 175/79  Pulse: 96 65  Resp: 14 18  Temp: 36.4 C 36.6 C  SpO2: 94% 97%    Last Pain:  Vitals:   04/27/21 1516  TempSrc:   PainSc: [name]

## 2021-04-27 NOTE — Care Management Obs Status (Signed)
MEDICARE OBSERVATION STATUS NOTIFICATION   Patient Details  Name: Jean Stout MRN: [id number] Date of Birth: 1940-08-24   Medicare Observation Status Notification Given:  Yes    Marlowe Sax, RN 04/27/2021, 4:20 PM

## 2021-04-27 NOTE — Op Note (Signed)
04/27/2021  9:20 AM  PATIENT:  Jean Stout  81 y.o. female  PRE-OPERATIVE DIAGNOSIS:  Primary osteoarthritis of right knee  M17.11  POST-OPERATIVE DIAGNOSIS:  Primary osteoarthritis of right knee  M17.11  PROCEDURE:  Procedure(s): TOTAL KNEE ARTHROPLASTY (Right)  SURGEON: Laurene Footman, MD  ASSISTANTS: Rachelle Hora, PA-C  ANESTHESIA:   spinal  EBL:  Total I/O In: 1000 [I.V.:800; IV Piggyback:200] Out: 350 [Urine:200; Blood:150]  BLOOD ADMINISTERED:none  DRAINS:  Incisional wound    LOCAL MEDICATIONS USED:  MARCAINE    and OTHER Exparel and morphine  SPECIMEN:  No Specimen  DISPOSITION OF SPECIMEN:  N/A  COUNTS:  YES  TOURNIQUET:   Total Tourniquet Time Documented: Thigh (Right) - 20 minutes Total: Thigh (Right) - 20 minutes   IMPLANTS: Medacta GM K sphere right 4 femur, 3I/3T tibial component with short stem and 10 mm insert, size 2 patella component, all components cemented  DICTATION: .Dragon Dictation    patient was brought to the operating room and spinal anesthesia was obtained.  After prepping and draping the right leg in sterile fashion, and after patient identification and timeout procedures were completed, midline skin incision was made followed by medial parapatellar arthrotomy with severe medial compartment osteoarthritis, severe patellofemoral arthritis and severe lateral compartment arthritis, with exposed bone in all 3 compartments, partial synovectomy was also carried out.   The ACL and PCL and fat pad were excised along with anterior horns of the meniscus. The proximal tibia cutting guide from  the extra medullary system was applied and the proximal tibia cut carried out.  The distal femoral cut was carried out in a similar fashion using intramedullary guide.  The 4 femoral cutting guide applied with anterior posterior and chamfer cuts made.  The posterior horns of the menisci were removed at this point.   Injection of the above medication was carried out  after the femoral and tibial cuts were carried out.  The 3 TI 4 baseplate trial was placed pinned into position and proximal tibial preparation carried out with drilling hand reaming and the keel punch followed by placement of the 4 femur and sizing the tibial insert size 10 millimeter gave the best fit with stability and full extension.  The distal femoral drill holes were made in the notch cut for the trochlear groove was then carried out with trials were then removed the patella was cut using the patellar cutting guide and it sized to a size 2 after drill holes have been made tourniquet was raised at this point.  The knee was irrigated with pulsatile lavage and the bony surfaces dried the tibial component was cemented into place first.  Excess cement was removed and the polyethylene insert placed with a torque screw placed with a torque screwdriver tightened.  The distal femoral component was placed and the knee was held in extension as the patellar button was clamped into place.  After the cement was set, excess cement was removed and the knee was again irrigated thoroughly thoroughly irrigated.  The tourniquet was let down and hemostasis checked with electrocautery. The arthrotomy was repaired with a heavy Quill suture,  followed by 3-0 V lock subcuticular closure, skin staples followed by incisional wound VAC and Polar Care.Marland Kitchen  PLAN OF CARE: Admit for overnight observation  PATIENT DISPOSITION:  PACU - hemodynamically stable.

## 2021-04-27 NOTE — TOC Progression Note (Addendum)
Transition of Care [facility]) - Progression Note    Patient Details  Name: Mackinze Kopec MRN: [id number] Date of Birth: 01/11/1941  Transition of Care [facility]) CM/SW [place], RN Phone Number: 04/27/2021, 3:48 PM  Clinical Narrative:      The patient is set up with [place] for [facility] services, she has a Rolling walker at home and has borrowed a 3 in 1, she has transportation with family and can afford her medication      Expected Discharge Plan and Services                                                 Social Determinants of Health (SDOH) Interventions    Readmission Risk Interventions No flowsheet data found.

## 2021-04-27 NOTE — Evaluation (Signed)
Physical Therapy Evaluation Patient Details Name: Jean Stout MRN: [id number] DOB: 03-May-1940 Today's Date: 04/27/2021  History of Present Illness  Pt is 81 y.o. female s/p R TKA on 04/27/21; recently had L TKA on 01/12/21.  PMH includes: CAD, MI, aortic stenosis, mild MV regurgitation, cardiac murmur, HTN, HLD, T2DM, CKD-III, OSAH (not complaint with nocturnal PAP therapy), GERD (on daily PPI), OA, osteopenia, anxiety, depression   Clinical Impression  Pt was A&Ox4 upon PT entrance into room for POD#0 evaluation. She reports her current R Knee pain as 7/10. Prior to hospitalization she states she was independent w/ all ADLs and lives in a first floor apartment in a retirement community and has a tub-shower w/ grab bars for bathroom necessities. She states that she does live with her twin sister, however she is the primary care giver out of the two of them. Her daughter will be staying with her until Friday (04/30/21) and then her son will be staying with her from then until the following week. She reports her next door neighbor is available per PRN.  Pt was able to complete exercises (see "total joint exercise" section) while supine in bed. She tolerated these exercises well and reported no increase in pain while performing these exercises. She completed bed mobility w/ supervision and reliance on bed rails due to pain levels. Once seated at EOB, she was able to perform sit to stand w/ CGA using a RW and required minimal verbal cueing for proper UE placement and utilization. She then was able to step pivot transfer by taking a few side steps to the recliner. Pt will benefit from continued skilled PT in order to improve LE strength, mobility, gait, decrease c/o pain, and restore PLOF. Current discharge recommendation remains appropriate due to the level of assistance required by the patient to ensure safety and improve overall function.      Recommendations for follow up therapy are one component of a  multi-disciplinary discharge planning process, led by the attending physician.  Recommendations may be updated based on patient status, additional functional criteria and insurance authorization.  Follow Up Recommendations Home health PT    Assistance Recommended at Discharge Intermittent Supervision/Assistance  Patient can return home with the following  A little help with walking and/or transfers;A little help with bathing/dressing/bathroom;Assistance with cooking/housework;Assist for transportation;Help with stairs or ramp for entrance    Equipment Recommendations Rolling walker (2 wheels)  Recommendations for Other Services       Functional Status Assessment Patient has had a recent decline in their functional status and demonstrates the ability to make significant improvements in function in a reasonable and predictable amount of time.     Precautions / Restrictions Precautions Precautions: Fall Restrictions Weight Bearing Restrictions: Yes      Mobility  Bed Mobility Overal bed mobility: Needs Assistance Bed Mobility: Supine to Sit     Supine to sit: Supervision, Min guard     General bed mobility comments: increased time, reliance on bed rails    Transfers Overall transfer level: Needs assistance Equipment used: Rolling walker (2 wheels) Transfers: Sit to/from Stand, Bed to chair/wheelchair/BSC Sit to Stand: Min guard   Step pivot transfers: Min guard            Ambulation/Gait                  Stairs            Wheelchair Mobility    Modified Rankin (Stroke Patients Only)  Balance Overall balance assessment: Needs assistance Sitting-balance support: Bilateral upper extremity supported, Feet supported Sitting balance-Leahy Scale: Good     Standing balance support: During functional activity, Bilateral upper extremity supported Standing balance-Leahy Scale: Fair                               Pertinent  Vitals/Pain Pain Assessment Pain Assessment: 0-10 Pain Score: 7  Pain Descriptors / Indicators: Aching, Discomfort, Guarding, Grimacing Pain Intervention(s): Limited activity within patient's tolerance, Monitored during session, Patient requesting pain meds-RN notified, Repositioned, Ice applied    Home Living Family/patient expects to be discharged to:: Private residence Living Arrangements: Other relatives Available Help at Discharge: Family;Available PRN/intermittently Type of Home: Apartment Home Access: Level entry       Home Layout: One level Home Equipment: Hand held shower head;Grab bars - tub/shower;Toilet riser;Rollator (4 wheels);Rolling Walker (2 wheels)      Prior Function Prior Level of Function : Independent/Modified Independent                     Hand Dominance        Extremity/Trunk Assessment   Upper Extremity Assessment Upper Extremity Assessment: Overall WFL for tasks assessed    Lower Extremity Assessment Lower Extremity Assessment: RLE deficits/detail RLE Deficits / Details: decreased strength due to TKA RLE Sensation: WNL RLE Coordination: WNL       Communication   Communication: No difficulties  Cognition Arousal/Alertness: Awake/alert Behavior During Therapy: WFL for tasks assessed/performed Overall Cognitive Status: Within Functional Limits for tasks assessed                                          General Comments      Exercises Total Joint Exercises Quad Sets: AROM, Both, 10 reps Heel Slides: AROM, Left, 5 reps, AAROM, Right, 10 reps Hip ABduction/ADduction: AROM, Left, 5 reps, AAROM, Right, 10 reps Straight Leg Raises: AROM, Left, 5 reps, AAROM, Right, 10 reps Knee Flexion: AROM, Right, 10 reps (@ EOB)   Assessment/Plan    PT Assessment Patient needs continued PT services  PT Problem List Decreased strength;Decreased range of motion;Decreased activity tolerance;Decreased balance;Decreased  coordination;Decreased mobility;Pain       PT Treatment Interventions DME instruction;Therapeutic exercise;Gait training;Balance training;Stair training;Functional mobility training;Therapeutic activities;Patient/family education    PT Goals (Current goals can be found in the Care Plan section)  Acute Rehab PT Goals Patient Stated Goal: decrease c/o pain to return home PT Goal Formulation: With patient Time For Goal Achievement: 05/11/21 Potential to Achieve Goals: Good    Frequency BID     Co-evaluation               AM-PAC PT "6 Clicks" Mobility  Outcome Measure Help needed turning from your back to your side while in a flat bed without using bedrails?: A Little Help needed moving from lying on your back to sitting on the side of a flat bed without using bedrails?: A Little Help needed moving to and from a bed to a chair (including a wheelchair)?: A Little Help needed standing up from a chair using your arms (e.g., wheelchair or bedside chair)?: A Little Help needed to walk in hospital room?: A Lot Help needed climbing 3-5 steps with a railing? : A Lot 6 Click Score: 16    End of Session  Equipment Utilized During Treatment: Gait belt Activity Tolerance: Patient tolerated treatment well;Patient limited by pain Patient left: in chair;with call bell/phone within reach;with family/visitor present Nurse Communication: Mobility status PT Visit Diagnosis: Unsteadiness on feet (R26.81);Muscle weakness (generalized) (M62.81);Pain;Other abnormalities of gait and mobility (R26.89) Pain - Right/Left: Right Pain - part of body: Knee    Time: 1355-1433 PT Time Calculation (min) (ACUTE ONLY): 38 min   Charges:              Jeralyn Bennett, SPT 04/27/2021, 3:30 PM

## 2021-04-28 DIAGNOSIS — M1711 Unilateral primary osteoarthritis, right knee: Secondary | ICD-10-CM | POA: Diagnosis not present

## 2021-04-28 LAB — CBC
HCT: 29.4 % — ABNORMAL LOW (ref 36.0–46.0)
Hemoglobin: 9.2 g/dL — ABNORMAL LOW (ref 12.0–15.0)
MCH: 27.7 pg (ref 26.0–34.0)
MCHC: 31.3 g/dL (ref 30.0–36.0)
MCV: 88.6 fL (ref 80.0–100.0)
Platelets: 251 10*3/uL (ref 150–400)
RBC: 3.32 MIL/uL — ABNORMAL LOW (ref 3.87–5.11)
RDW: 14.2 % (ref 11.5–15.5)
WBC: 7.4 10*3/uL (ref 4.0–10.5)
nRBC: 0 % (ref 0.0–0.2)

## 2021-04-28 LAB — GLUCOSE, CAPILLARY: Glucose-Capillary: 134 mg/dL — ABNORMAL HIGH (ref 70–99)

## 2021-04-28 LAB — BASIC METABOLIC PANEL
Anion gap: 6 (ref 5–15)
BUN: 18 mg/dL (ref 8–23)
CO2: 24 mmol/L (ref 22–32)
Calcium: 8.8 mg/dL — ABNORMAL LOW (ref 8.9–10.3)
Chloride: 104 mmol/L (ref 98–111)
Creatinine, Ser: 0.89 mg/dL (ref 0.44–1.00)
GFR, Estimated: >60 mL/min (ref >60)
Glucose, Bld: 151 mg/dL — ABNORMAL HIGH (ref 70–99)
Potassium: 4.2 mmol/L (ref 3.5–5.1)
Sodium: 134 mmol/L — ABNORMAL LOW (ref 135–145)

## 2021-04-28 MED ORDER — METHOCARBAMOL 500 MG PO TABS: 500.0000 mg | ORAL_TABLET | Freq: Four times a day (QID) | ORAL | 0 refills | Status: DC | PRN

## 2021-04-28 MED ORDER — INSULIN ASPART 100 UNIT/ML IJ SOLN
INTRAMUSCULAR | Status: AC
  Administered 2021-04-28: 2 [IU] via SUBCUTANEOUS
  Filled 2021-04-28: qty 1

## 2021-04-28 MED ORDER — HYDROCODONE-ACETAMINOPHEN 7.5-325 MG PO TABS
ORAL_TABLET | ORAL | Status: AC
  Filled 2021-04-28: qty 2

## 2021-04-28 MED ORDER — DOCUSATE SODIUM 100 MG PO CAPS: 100.0000 mg | ORAL_CAPSULE | Freq: Two times a day (BID) | ORAL | 0 refills | Status: DC

## 2021-04-28 MED ORDER — ENOXAPARIN SODIUM 40 MG/0.4ML IJ SOSY: 40.0000 mg | PREFILLED_SYRINGE | INTRAMUSCULAR | 0 refills | Status: DC

## 2021-04-28 MED ORDER — HYDROCODONE-ACETAMINOPHEN 5-325 MG PO TABS
ORAL_TABLET | ORAL | Status: AC
  Administered 2021-04-28: 1
  Filled 2021-04-28: qty 1

## 2021-04-28 MED ORDER — FE FUMARATE-B12-VIT C-FA-IFC PO CAPS
1.0000 | ORAL_CAPSULE | Freq: Two times a day (BID) | ORAL | Status: DC
  Administered 2021-04-28: 1 via ORAL
  Filled 2021-04-28 (×2): qty 1

## 2021-04-28 MED ORDER — HYDROCODONE-ACETAMINOPHEN 7.5-325 MG PO TABS: 1.0000 | ORAL_TABLET | ORAL | 0 refills | Status: DC | PRN

## 2021-04-28 MED ORDER — KETOROLAC TROMETHAMINE 15 MG/ML IJ SOLN
INTRAMUSCULAR | Status: AC
  Filled 2021-04-28: qty 1

## 2021-04-28 MED ORDER — POLYETHYLENE GLYCOL 3350 17 G PO PACK: 17.0000 g | PACK | Freq: Every day | ORAL | 0 refills | Status: DC | PRN

## 2021-04-28 MED ORDER — DOCUSATE SODIUM 100 MG PO CAPS
ORAL_CAPSULE | ORAL | Status: AC
  Filled 2021-04-28: qty 1

## 2021-04-28 NOTE — Progress Notes (Signed)
° °  Subjective: 1 Day Post-Op Procedure(s) (LRB): TOTAL KNEE ARTHROPLASTY (Right) Patient reports pain as moderate.   Patient is well, and has had no acute complaints or problems Denies any CP, SOB, ABD pain. We will continue therapy today.  Plan is to go Home after hospital stay.  Objective: Vital signs in last 24 hours: Temp:  [97.2 F (36.2 C)-99.1 F (37.3 C)] 99.1 F (37.3 C) (02/01 0400) Pulse Rate:  [60-96] 65 (02/01 0400) Resp:  [10-20] 16 (02/01 0400) BP: (138-193)/(66-79) 187/69 (02/01 0400) SpO2:  [94 %-98 %] 95 % (02/01 0400)  Intake/Output from previous day: 01/31 0701 - 02/01 0700 In: 1060 [P.O.:60; I.V.:800; IV Piggyback:200] Out: 2950 [Urine:2800; Blood:150] Intake/Output this shift: No intake/output data recorded.  Recent Labs    04/27/21 1030 04/28/21 0520  HGB 9.8* 9.2*   Recent Labs    04/27/21 1030 04/28/21 0520  WBC 6.8 7.4  RBC 3.53* 3.32*  HCT 31.6* 29.4*  PLT 249 251   Recent Labs    04/27/21 1030 04/28/21 0520  NA  --  134*  K  --  4.2  CL  --  104  CO2  --  24  BUN  --  18  CREATININE 0.83 0.89  GLUCOSE  --  151*  CALCIUM  --  8.8*   No results for input(s): LABPT, INR in the last 72 hours.  EXAM General - Patient is Alert, Appropriate, and Oriented Extremity - Neurovascular intact Sensation intact distally Intact pulses distally Dorsiflexion/Plantar flexion intact Dressing - dressing C/D/I and no drainage Motor Function - intact, moving foot and toes well on exam.   Past Medical History:  Diagnosis Date   Anxiety and depression    Aortic stenosis    Arthritis    Cardiac murmur    a.) Grade I/VI medium pitched mid systolic blowing at lower LSB   CKD (chronic kidney disease), stage III (HCC)    Coronary artery disease    GERD (gastroesophageal reflux disease)    History of 2019 novel coronavirus disease (COVID-19) 12/29/2020   a.) asymptomatic; PCR (+) on 12/29/2020 with confirmatory (+) rapid PCR on 12/30/2020.    History of MI (myocardial infarction) 2010   HLD (hyperlipidemia)    HTN (hypertension)    IDA (iron deficiency anemia)    Mild mitral regurgitation    Myocardial infarction [facility]) 2010   a.) Tx'd at [place]; PCI with stents x 2 (unknown type and location).   OSA on CPAP    a.) not complaint with prescribed nocturnal PAP therapy   Osteopenia    PONV (postoperative nausea and vomiting)    T2DM (type 2 diabetes mellitus) (HCC)    Vaginal prolapse     Assessment/Plan:   1 Day Post-Op Procedure(s) (LRB): TOTAL KNEE ARTHROPLASTY (Right) Principal Problem:   S/P TKR (total knee replacement) using cement, right  Estimated body mass index is 32.46 kg/m as calculated from the following:   Height as of this encounter: 5\' 2"  (1.575 m).   Weight as of this encounter: 80.5 kg. Advance diet Up with therapy Pain controlled, continue to monitor VSS Labs stable CM to assist with discharge to home with HHPT pending completion of PT goals  DVT Prophylaxis - Lovenox, TED hose, and SCDs Weight-Bearing as tolerated to right leg   T. Rachelle Hora, PA-C [place] 04/28/2021, 8:02 AM

## 2021-04-28 NOTE — Progress Notes (Addendum)
Physical Therapy Treatment Patient Details Name: Jean Stout MRN: [id number] DOB: 08-27-1940 Today's Date: 04/28/2021   History of Present Illness Pt is 81 y.o. female s/p R TKA on 04/27/21; recently had L TKA on 01/12/21.  PMH includes: CAD, MI, aortic stenosis, mild MV regurgitation, cardiac murmur, HTN, HLD, T2DM, CKD-III, OSAH (not complaint with nocturnal PAP therapy), GERD (on daily PPI), OA, osteopenia, anxiety, depression    PT Comments    Pt was awake and alert resting in recliner upon PT entrance into room. She currently reports her R Knee pain as 6/10. She was able to perform sit to stand w/ CGA and using RW. Once standing Pt was able to ambulate ~185ft w/ CGA and RW with no increase in pain throughout session. Pt then returned to recliner for the end of the session with all needs within reach. Pt will benefit from continued skilled PT in order to improve LE strength, mobility, gait, decrease c/o pain, and restore PLOF. Current discharge recommendation remains appropriate due to the level of assistance required by the patient to ensure safety and improve overall function.   Recommendations for follow up therapy are one component of a multi-disciplinary discharge planning process, led by the attending physician.  Recommendations may be updated based on patient status, additional functional criteria and insurance authorization.  Follow Up Recommendations  Home health PT     Assistance Recommended at Discharge Intermittent Supervision/Assistance  Patient can return home with the following A little help with walking and/or transfers;A little help with bathing/dressing/bathroom;Assistance with cooking/housework;Assist for transportation;Help with stairs or ramp for entrance   Equipment Recommendations  Rolling walker (2 wheels)    Recommendations for Other Services       Precautions / Restrictions Precautions Precautions: Fall;Knee Restrictions Weight Bearing Restrictions:  Yes RLE Weight Bearing: Weight bearing as tolerated     Mobility  Bed Mobility                    Transfers Overall transfer level: Needs assistance Equipment used: Rolling walker (2 wheels) Transfers: Sit to/from Stand Sit to Stand: Min guard                Ambulation/Gait Ambulation/Gait assistance: Min guard Gait Distance (Feet): 150 Feet Assistive device: Rolling walker (2 wheels) Gait Pattern/deviations: Step-to pattern, Decreased step length - right, Decreased step length - left, Decreased stride length, Step-through pattern Gait velocity: decreased     General Gait Details: Pt progressed to step-through pattern w/ ambulation.   Stairs             Wheelchair Mobility    Modified Rankin (Stroke Patients Only)       Balance Overall balance assessment: Needs assistance Sitting-balance support: Bilateral upper extremity supported, Feet supported Sitting balance-Leahy Scale: Good     Standing balance support: Bilateral upper extremity supported, During functional activity Standing balance-Leahy Scale: Good                              Cognition Arousal/Alertness: Awake/alert Behavior During Therapy: WFL for tasks assessed/performed Overall Cognitive Status: Within Functional Limits for tasks assessed                                          Exercises Total Joint Exercises Straight Leg Raises: AROM, Left, 5 reps, AAROM, Right, 10 reps Knee  Flexion: AROM, Right, 10 reps Goniometric ROM: ~100deg    General Comments        Pertinent Vitals/Pain Pain Assessment Pain Assessment: 0-10 Pain Score: 6  Pain Location: R knee Pain Descriptors / Indicators: Aching, Discomfort, Guarding, Grimacing Pain Intervention(s): Limited activity within patient's tolerance, Monitored during session, Ice applied, Repositioned    Home Living Family/patient expects to be discharged to:: Private residence Living  Arrangements: Other relatives Available Help at Discharge: Family;Available PRN/intermittently Type of Home: Apartment Home Access: Level entry       Home Layout: One level Home Equipment: Hand held shower head;Grab bars - tub/shower;Toilet riser;Rollator (4 wheels);Rolling Walker (2 wheels) Additional Comments: 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    Prior Function            PT Goals (current goals can now be found in the care plan section) Progress towards PT goals: Progressing toward goals    Frequency    BID      PT Plan      Co-evaluation              AM-PAC PT "6 Clicks" Mobility   Outcome Measure  Help needed turning from your back to your side while in a flat bed without using bedrails?: A Little Help needed moving from lying on your back to sitting on the side of a flat bed without using bedrails?: A Little   Help needed standing up from a chair using your arms (e.g., wheelchair or bedside chair)?: A Little Help needed to walk in hospital room?: A Little Help needed climbing 3-5 steps with a railing? : A Little 6 Click Score: 15    End of Session Equipment Utilized During Treatment: Gait belt Activity Tolerance: Patient tolerated treatment well;Patient limited by pain Patient left: in chair;with call bell/phone within reach;with family/visitor present Nurse Communication: Mobility status PT Visit Diagnosis: Unsteadiness on feet (R26.81);Muscle weakness (generalized) (M62.81);Pain;Other abnormalities of gait and mobility (R26.89) Pain - Right/Left: Right Pain - part of body: Knee     Time: [id number] PT Time Calculation (min) (ACUTE ONLY): 24 min  Charges:  $Therapeutic Exercise: 23-37 mins                      Jonnie Kind, SPT 04/28/2021, 11:34 AM

## 2021-04-28 NOTE — Discharge Summary (Signed)
Physician Discharge Summary  Patient ID: Jean Stout MRN: [id number] DOB/AGE: [birth year] 81 y.o.  Admit date: 04/27/2021 Discharge date: 04/28/2021  Admission Diagnoses:  S/P TKR (total knee replacement) using cement, right [Z96.651]   Discharge Diagnoses: Patient Active Problem List   Diagnosis Date Noted   S/P TKR (total knee replacement) using cement, right 04/27/2021   S/P TKR (total knee replacement) using cement, left 01/12/2021   Bilateral primary osteoarthritis of knee 05/19/2020   Secondary hyperparathyroidism of renal origin ([place]) 09/11/2019   Anemia in chronic kidney disease 08/02/2019   Benign hypertensive kidney disease with chronic kidney disease 08/02/2019   Stage 3a chronic kidney disease ([place]) 08/02/2019   MDD (major depressive disorder), recurrent episode, moderate ([place]) 11/02/2018   GAD (generalized anxiety disorder) 11/02/2018   Panic attacks 11/02/2018   Insomnia due to mental condition 11/02/2018   Primary osteoarthritis of left knee 10/09/2018   Localized primary osteoarthritis of shoulder regions, bilateral 10/09/2018   Right wrist pain 10/09/2018   Chronic pain of right ankle 10/09/2018   Chronic pain syndrome 10/09/2018   Polyarthralgia 10/09/2018   History of bilateral hip replacements 10/09/2018   Aortic stenosis, moderate 03/13/2018   Coronary artery disease involving native coronary artery of native heart without angina pectoris 03/01/2018   Depression 03/01/2018   Essential hypertension 03/01/2018   GERD without esophagitis 03/01/2018   Hyperlipidemia, mixed 03/01/2018   Iron deficiency anemia 03/01/2018   OSA on CPAP 03/01/2018   Type 2 diabetes mellitus without complication, without long-term current use of insulin ([place]) 03/01/2018   Osteoarthritis of multiple joints 03/01/2018    Past Medical History:  Diagnosis Date   Anxiety and depression    Aortic stenosis    Arthritis    Cardiac murmur    a.) Grade I/VI medium pitched mid  systolic blowing at lower LSB   CKD (chronic kidney disease), stage III ([place])    Coronary artery disease    GERD (gastroesophageal reflux disease)    History of 2019 novel coronavirus disease (COVID-19) 12/29/2020   a.) asymptomatic; PCR (+) on 12/29/2020 with confirmatory (+) rapid PCR on 12/30/2020.   History of MI (myocardial infarction) 2010   HLD (hyperlipidemia)    HTN (hypertension)    IDA (iron deficiency anemia)    Mild mitral regurgitation    Myocardial infarction [facility]) 2010   a.) Tx'd at [place]; PCI with stents x 2 (unknown type and location).   OSA on CPAP    a.) not complaint with prescribed nocturnal PAP therapy   Osteopenia    PONV (postoperative nausea and vomiting)    T2DM (type 2 diabetes mellitus) ([place])    Vaginal prolapse      Transfusion: none   Consultants (if any):   Discharged Condition: Improved  Hospital Course: [name] is an 81 y.o. female who was admitted 04/27/2021 with a diagnosis of S/P TKR (total knee replacement) using cement, right and went to the operating room on 04/27/2021 and underwent the above named procedures.    Surgeries: Procedure(s): TOTAL KNEE ARTHROPLASTY on 04/27/2021 Patient tolerated the surgery well. Taken to PACU where she was stabilized and then transferred to the orthopedic floor.  Started on Lovenox 30 mg q 12 hrs. Foot pumps applied bilaterally at 80 mm. Heels elevated on bed with rolled towels. No evidence of DVT. Negative [place]. Physical therapy started on day #1 for gait training and transfer. OT started day #1 for ADL and assisted devices.  Patient's foley was d/c on day #  1. Patient's IV  was d/c on day #1.  On post op day #1 patient was stable and ready for discharge to home with HHPT.   She was given perioperative antibiotics:  Anti-infectives (From admission, onward)    Start     Dose/Rate Route Frequency Ordered Stop   04/27/21 1936  ceFAZolin (ANCEF) 2-4 GM/100ML-% IVPB       Note to  Pharmacy: [place] Calix, Cryst: cabinet override      04/27/21 1936 04/27/21 2018   04/27/21 1330  ceFAZolin (ANCEF) IVPB 2g/100 mL premix        2 g 200 mL/hr over 30 Minutes Intravenous Every 6 hours 04/27/21 0924 04/27/21 2018   04/27/21 1310  ceFAZolin (ANCEF) 2-4 GM/100ML-% IVPB       Note to Pharmacy: [place], Amy J: cabinet override      04/27/21 1310 04/27/21 1316   04/27/21 0623  ceFAZolin (ANCEF) 2-4 GM/100ML-% IVPB       Note to Pharmacy: [place] Abraham W: cabinet override      04/27/21 0623 04/27/21 1407   04/27/21 0600  ceFAZolin (ANCEF) IVPB 2g/100 mL premix        2 g 200 mL/hr over 30 Minutes Intravenous On call to O.R. 04/27/21 [id number] 04/27/21 [id number]     .  She was given sequential compression devices, early ambulation, and Lovenox for DVT prophylaxis.  She benefited maximally from the hospital stay and there were no complications.    Recent vital signs:  Vitals:   04/27/21 2358 04/28/21 0400  BP: (!) 143/77 (!) 187/69  Pulse: 72 65  Resp: 16 16  Temp: 98.5 F (36.9 C) 99.1 F (37.3 C)  SpO2: 95% 95%    Recent laboratory studies:  Lab Results  Component Value Date   HGB 9.2 (L) 04/28/2021   HGB 9.8 (L) 04/27/2021   HGB 10.3 (L) 04/15/2021   Lab Results  Component Value Date   WBC 7.4 04/28/2021   PLT 251 04/28/2021   No results found for: INR Lab Results  Component Value Date   NA 134 (L) 04/28/2021   K 4.2 04/28/2021   CL 104 04/28/2021   CO2 24 04/28/2021   BUN 18 04/28/2021   CREATININE 0.89 04/28/2021   GLUCOSE 151 (H) 04/28/2021    Discharge Medications:   Allergies as of 04/28/2021       Reactions   Gadolinium Derivatives Rash   Iodinated Contrast Media Itching   Bextra [valdecoxib] Rash        Medication List     STOP taking these medications    acetaminophen 325 MG tablet Commonly known as: TYLENOL   aspirin EC 81 MG tablet       TAKE these medications    amLODipine 5 MG tablet Commonly known as: NORVASC Take 5  mg by mouth daily.   atorvastatin 40 MG tablet Commonly known as: LIPITOR Take 40 mg by mouth at bedtime.   cholecalciferol 25 MCG (1000 UNIT) tablet Commonly known as: VITAMIN D3 Take 1,000 Units by mouth daily.   diclofenac Sodium 1 % Gel Commonly known as: VOLTAREN Apply 1 application topically 3 (three) times daily as needed (pain).   docusate sodium 100 MG capsule Commonly known as: COLACE Take 1 capsule (100 mg total) by mouth 2 (two) times daily.   DULoxetine 60 MG capsule Commonly known as: Cymbalta Take 2 capsules (120 mg total) by mouth daily.   enoxaparin 40 MG/0.4ML injection Commonly known as: LOVENOX Inject 0.4 mLs (  40 mg total) into the skin daily for 14 days. What changed: Another medication with the same name was added. Make sure you understand how and when to take each.   enoxaparin 40 MG/0.4ML injection Commonly known as: LOVENOX Inject 0.4 mLs (40 mg total) into the skin daily for 14 days. What changed: You were already taking a medication with the same name, and this prescription was added. Make sure you understand how and when to take each.   famotidine 40 MG tablet Commonly known as: PEPCID Take 40 mg by mouth at bedtime.   glucosamine-chondroitin 500-400 MG tablet Take 2 tablets by mouth daily.   hydrALAZINE 50 MG tablet Commonly known as: APRESOLINE Take 50 mg by mouth 3 (three) times daily.   HYDROcodone-acetaminophen 7.5-325 MG tablet Commonly known as: NORCO Take 1-2 tablets by mouth every 4 (four) hours as needed for severe pain (pain score 7-10).   lisinopril 40 MG tablet Commonly known as: ZESTRIL Take 40 mg by mouth daily.   Magnesium 250 MG Tabs Take 250 mg by mouth daily.   metFORMIN 500 MG 24 hr tablet Commonly known as: GLUCOPHAGE-XR Take 500 mg by mouth daily with breakfast.   methocarbamol 500 MG tablet Commonly known as: ROBAXIN Take 1 tablet (500 mg total) by mouth every 6 (six) hours as needed for muscle spasms.    multivitamin with minerals tablet Take 1 tablet by mouth daily.   omeprazole 20 MG capsule Commonly known as: PRILOSEC Take 20 mg by mouth daily.   polyethylene glycol 17 g packet Commonly known as: MIRALAX / GLYCOLAX Take 17 g by mouth daily as needed for mild constipation.               Durable Medical Equipment  (From admission, onward)           Start     Ordered   04/27/21 0924  DME Walker rolling  Once       Question Answer Comment  Walker: With 5 Inch Wheels   Patient needs a walker to treat with the following condition S/P TKR (total knee replacement) using cement, right      04/27/21 0923   04/27/21 0924  DME 3 n 1  Once        04/27/21 [id number]   04/27/21 0924  DME Bedside commode  Once       Question:  Patient needs a bedside commode to treat with the following condition  Answer:  S/P TKR (total knee replacement) using cement, right   04/27/21 0923            Diagnostic Studies: DG Knee 1-2 Views Right  Result Date: 04/27/2021 CLINICAL DATA:  Right knee arthroplasty EXAM: RIGHT KNEE - 1-2 VIEW COMPARISON:  None. FINDINGS: Status post right knee arthroplasty with expected overlying postoperative change. No evidence of perihardware fracture or component malpositioning. IMPRESSION: Status post right knee arthroplasty with expected overlying postoperative change. No evidence of perihardware fracture or component malpositioning. Electronically Signed   By: Delanna Ahmadi M.D.   On: 04/27/2021 09:56    Disposition:      Follow-up Information     Duanne Guess, PA-C Follow up in 2 week(s).   Specialties: Orthopedic Surgery, Emergency Medicine Contact information: [place] [address] [phone]                  Signed: Feliberto Gottron 04/28/2021, 8:11 AM

## 2021-04-28 NOTE — Discharge Instructions (Signed)

## 2021-04-28 NOTE — Evaluation (Signed)
Occupational Therapy Evaluation Patient Details Name: Jean Stout MRN: [id number] DOB: 09/02/1940 Today's Date: 04/28/2021   History of Present Illness Pt is 81 y.o. female s/p R TKA on 04/27/21; recently had L TKA on 01/12/21.  PMH includes: CAD, MI, aortic stenosis, mild MV regurgitation, cardiac murmur, HTN, HLD, T2DM, CKD-III, OSAH (not complaint with nocturnal PAP therapy), GERD (on daily PPI), OA, osteopenia, anxiety, depression   Clinical Impression   Ms Streeper was seen for OT evaluation this date, POD#1 from above surgery. Pt lives in apartment with level entry, family available PRN. 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 Pt currently MOD I for bed mobility. SUPERVISION + RW for BSC t/f and perihygiene in standing. MIN A for LB access sitting EOC, assist due to pain and limited AROM of R knee. Pt instructed in polar care mgt, falls prevention strategies, home/routines modifications, and DME/AE for LB bathing / dressing tasks. No further skilled acute OT needs identified, will sign off. Do not currently anticipate any OT needs following this hospitalization.         Recommendations for follow up therapy are one component of a multi-disciplinary discharge planning process, led by the attending physician.  Recommendations may be updated based on patient status, additional functional criteria and insurance authorization.   Follow Up Recommendations  No OT follow up    Assistance Recommended at Discharge Intermittent Supervision/Assistance  Patient can return home with the following A little help with bathing/dressing/bathroom;Help with stairs or ramp for entrance    Functional Status Assessment  Patient has had a recent decline in their functional status and demonstrates the ability to make significant improvements in function in a reasonable and predictable amount of time.  Equipment Recommendations  None recommended by OT (has all  equipment needs)    Recommendations for Other Services       Precautions / Restrictions Precautions Precautions: Fall;Knee Restrictions Weight Bearing Restrictions: Yes RLE Weight Bearing: Weight bearing as tolerated      Mobility Bed Mobility Overal bed mobility: Modified Independent Bed Mobility: Supine to Sit     Supine to sit: HOB elevated     General bed mobility comments: increased time, reliance on bed rails    Transfers Overall transfer level: Needs assistance Equipment used: Rolling walker (2 wheels) Transfers: Sit to/from Stand, Bed to chair/wheelchair/BSC Sit to Stand: Supervision     Step pivot transfers: Supervision     General transfer comment: SBA with no cues for technique      Balance Overall balance assessment: Needs assistance Sitting-balance support: No upper extremity supported, Feet supported Sitting balance-Leahy Scale: Good     Standing balance support: No upper extremity supported, During functional activity Standing balance-Leahy Scale: Good                             ADL either performed or assessed with clinical judgement   ADL Overall ADL's : Needs assistance/impaired                                       General ADL Comments: SUPERVISION + RW for BSC t/f and perihygiene in standing. MIN A for LB access seated EOC.      Pertinent Vitals/Pain Pain Assessment Pain Assessment: 0-10 Pain Score: 6  Pain Location: R knee Pain  Descriptors / Indicators: Aching, Discomfort, Guarding, Grimacing Pain Intervention(s): Limited activity within patient's tolerance, Repositioned     Hand Dominance Right   Extremity/Trunk Assessment Upper Extremity Assessment Upper Extremity Assessment: Overall WFL for tasks assessed   Lower Extremity Assessment Lower Extremity Assessment: Generalized weakness       Communication Communication Communication: No difficulties   Cognition Arousal/Alertness:  Awake/alert Behavior During Therapy: WFL for tasks assessed/performed Overall Cognitive Status: Within Functional Limits for tasks assessed                                                  Home Living Family/patient expects to be discharged to:: Private residence Living Arrangements: Other relatives Available Help at Discharge: Family;Available PRN/intermittently Type of Home: Apartment Home Access: Level entry     Home Layout: One level     Bathroom Shower/Tub: Occupational psychologist: Standard Bathroom Accessibility: Yes   Home Equipment: Hand held shower head;Grab bars - tub/shower;Toilet riser;Rollator (4 wheels);Rolling Walker (2 wheels)   Additional Comments: 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      Prior Functioning/Environment Prior Level of Function : Independent/Modified Independent;Driving                        OT Problem List: Decreased range of motion;Decreased strength;Decreased activity tolerance;Impaired balance (sitting and/or standing);Decreased safety awareness      OT Treatment/Interventions:      OT Goals(Current goals can be found in the care plan section) Acute Rehab OT Goals Patient Stated Goal: to go home OT Goal Formulation: With patient Time For Goal Achievement: 05/12/21 Potential to Achieve Goals: Good  OT Frequency:      Co-evaluation              AM-PAC OT "6 Clicks" Daily Activity     Outcome Measure Help from another person eating meals?: None Help from another person taking care of personal grooming?: None Help from another person toileting, which includes using toliet, bedpan, or urinal?: A Little Help from another person bathing (including washing, rinsing, drying)?: A Little Help from another person to put on and taking off regular upper body clothing?: None Help from another person to put on and taking off regular lower body clothing?: A  Little 6 Click Score: 21   End of Session Equipment Utilized During Treatment: Rolling walker (2 wheels) Nurse Communication: Mobility status  Activity Tolerance: Patient tolerated treatment well Patient left: with call bell/phone within reach;in chair  OT Visit Diagnosis: Other abnormalities of gait and mobility (R26.89);Muscle weakness (generalized) (M62.81)                Time: [id number] OT Time Calculation (min): 24 min Charges:  OT General Charges $OT Visit: 1 Visit OT Evaluation $OT Eval Low Complexity: 1 Low OT Treatments $Self Care/Home Management : 8-22 mins  Dessie Coma, M.S. OTR/L  04/28/21, 9:30 AM  ascom [phone]

## 2021-05-04 ENCOUNTER — Telehealth: Payer: Self-pay | Admitting: Student in an Organized Health Care Education/Training Program

## 2021-05-04 NOTE — Telephone Encounter (Signed)
Patient did not realize that she had a script from Dr. Rudene Christians for #40 7.5mg  hydrocodone at the pharmacy for post op pain. Explained that she must make the Dr. Holley Raring script last until 05/23/2021 as per her pain contract with [place].

## 2021-05-04 NOTE — Telephone Encounter (Signed)
Please call patient regarding pain meds She had knee last week. She was told to take xtra meds and put on methocarbomal. Please call patient regarding medications.

## 2021-05-13 ENCOUNTER — Telehealth: Payer: Self-pay | Admitting: Student in an Organized Health Care Education/Training Program

## 2021-05-13 NOTE — Telephone Encounter (Signed)
Appointment scheduled.

## 2021-05-17 ENCOUNTER — Ambulatory Visit
Payer: Medicare Other | Attending: Student in an Organized Health Care Education/Training Program | Admitting: Student in an Organized Health Care Education/Training Program

## 2021-05-17 ENCOUNTER — Other Ambulatory Visit: Payer: Self-pay

## 2021-05-17 ENCOUNTER — Encounter: Payer: Self-pay | Admitting: Student in an Organized Health Care Education/Training Program

## 2021-05-17 VITALS — BP 135/60 | HR 67 | Temp 99.0°F | Resp 18 | Ht 60.0 in | Wt 173.0 lb

## 2021-05-17 DIAGNOSIS — M19012 Primary osteoarthritis, left shoulder: Secondary | ICD-10-CM | POA: Diagnosis present

## 2021-05-17 DIAGNOSIS — M1712 Unilateral primary osteoarthritis, left knee: Secondary | ICD-10-CM | POA: Insufficient documentation

## 2021-05-17 DIAGNOSIS — G894 Chronic pain syndrome: Secondary | ICD-10-CM | POA: Diagnosis not present

## 2021-05-17 DIAGNOSIS — F331 Major depressive disorder, recurrent, moderate: Secondary | ICD-10-CM | POA: Diagnosis present

## 2021-05-17 DIAGNOSIS — M25571 Pain in right ankle and joints of right foot: Secondary | ICD-10-CM | POA: Diagnosis present

## 2021-05-17 DIAGNOSIS — M170 Bilateral primary osteoarthritis of knee: Secondary | ICD-10-CM | POA: Diagnosis present

## 2021-05-17 DIAGNOSIS — M19011 Primary osteoarthritis, right shoulder: Secondary | ICD-10-CM | POA: Diagnosis present

## 2021-05-17 DIAGNOSIS — G8929 Other chronic pain: Secondary | ICD-10-CM | POA: Diagnosis present

## 2021-05-17 MED ORDER — HYDROCODONE-ACETAMINOPHEN 7.5-325 MG PO TABS: 1.0000 | ORAL_TABLET | Freq: Four times a day (QID) | ORAL | 0 refills | Status: AC | PRN

## 2021-05-17 MED ORDER — METHOCARBAMOL 500 MG PO TABS: 500.0000 mg | ORAL_TABLET | Freq: Three times a day (TID) | ORAL | 2 refills | Status: DC | PRN

## 2021-05-17 MED ORDER — HYDROCODONE-ACETAMINOPHEN 7.5-325 MG PO TABS: 1.0000 | ORAL_TABLET | Freq: Two times a day (BID) | ORAL | 0 refills | Status: AC | PRN

## 2021-05-17 MED ORDER — HYDROCODONE-ACETAMINOPHEN 7.5-325 MG PO TABS: 1.0000 | ORAL_TABLET | Freq: Three times a day (TID) | ORAL | 0 refills | Status: AC | PRN

## 2021-05-17 NOTE — Progress Notes (Signed)
Safety precautions to be maintained throughout the outpatient stay will include: orient to surroundings, keep bed in low position, maintain call bell within reach at all times, provide assistance with transfer out of bed and ambulation.   Nursing Pain Medication Assessment:  Safety precautions to be maintained throughout the outpatient stay will include: orient to surroundings, keep bed in low position, maintain call bell within reach at all times, provide assistance with transfer out of bed and ambulation.  Medication Inspection Compliance: Pill count conducted under aseptic conditions, in front of the patient. Neither the pills nor the bottle was removed from the patient's sight at any time. Once count was completed pills were immediately returned to the patient in their original bottle.  Medication: Hydrocodone/APAP Pill/Patch Count:  0 of 90 pills remain Pill/Patch Appearance: Markings consistent with prescribed medication Bottle Appearance: Standard pharmacy container. Clearly labeled. Filled Date: 37 / 22 / 2022 Last Medication intake:  Today

## 2021-05-17 NOTE — Progress Notes (Signed)
PROVIDER NOTE: Information contained herein reflects review and annotations entered in association with encounter. Interpretation of such information and data should be left to medically-trained personnel. Information provided to patient can be located elsewhere in the medical record under "Patient Instructions". Document created using STT-dictation technology, any transcriptional errors that may result from process are unintentional.    Patient: Jean Stout  Service Category: E/M  Provider: Gillis Santa, MD  DOB: [birth year]  DOS: 05/17/2021  Specialty: Interventional Pain Management  MRN: [id number]  Setting: Ambulatory outpatient  PCP: Gladstone Lighter, MD  Type: Established Patient    Referring Provider: Gladstone Lighter, MD  Location: Office  Delivery: Face-to-face     HPI  Jean Stout, a 81 y.o. year old female, is here today because of her Bilateral primary osteoarthritis of knee [M17.0]. Jean Stout's primary complain today is Knee Pain  Last encounter: My last encounter with her was on 02/02/21  Pertinent problems: Jean Stout has Type 2 diabetes mellitus without complication, without long-term current use of insulin ([place]); Osteoarthritis of multiple joints; Primary osteoarthritis of left knee; Localized primary osteoarthritis of shoulder regions, bilateral; Right wrist pain; Chronic pain syndrome; Polyarthralgia; History of bilateral hip replacements; MDD (major depressive disorder), recurrent episode, moderate ([place]); GAD (generalized anxiety disorder); and Panic attacks on their pertinent problem list. Pain Assessment: Severity of Chronic pain is reported as a 6 /10. Location: Knee Right/Around and back of knee. Onset: More than a month ago. Quality: Constant, Aching, Burning, Throbbing. Timing: Constant. Modifying factor(s): Pain medication, ice, and proping knee over pillow. Vitals:  height is 5' (1.524 m) and weight is 173 lb (78.5 kg). Her oral temperature is 99 F (37.2 C). Her  blood pressure is 135/60 and her pulse is 67. Her respiration is 18 and oxygen saturation is 98%.   Reason for encounter: medication management.    [name] presents today for medication management.  She is status post right total knee arthroplasty on 04/27/2021.  She states that she is recovering from her surgery.  She is utilizing 50 mg of hydrocodone in the morning, 50 mg of hydrocodone in the afternoon and 7.5 mg at night.  I informed her that we will start to wean her postoperative hydrocodone.  We will reduce to 7.5 mg every 6 hours as needed and 7.5 mg every 8 hours as needed then 7.5 mg every 12 hours.  The patient follows back up in 3 months, we will transition her back to her tramadol for chronic pain syndrome.  She does have swelling of her right knee which is to be expected after surgery.  Overall she states that it does feel better.  Pharmacotherapy Assessment  Analgesic: Hydrocodone 7.5 mg q6hrs-->q8 hrs--> q12 hrs PRN    Monitoring: [place] PMP: PDMP reviewed during this encounter.       Pharmacotherapy: No side-effects or adverse reactions reported. Compliance: No problems identified. Effectiveness: Clinically acceptable.  Al Decant, RN  05/17/2021  2:38 PM  Sign when Signing Visit Safety precautions to be maintained throughout the outpatient stay will include: orient to surroundings, keep bed in low position, maintain call bell within reach at all times, provide assistance with transfer out of bed and ambulation.   Nursing Pain Medication Assessment:  Safety precautions to be maintained throughout the outpatient stay will include: orient to surroundings, keep bed in low position, maintain call bell within reach at all times, provide assistance with transfer out of bed and ambulation.  Medication Inspection Compliance: Pill count conducted under aseptic  conditions, in front of the patient. Neither the pills nor the bottle was removed from the patient's sight at any time. Once count  was completed pills were immediately returned to the patient in their original bottle.  Medication: Hydrocodone/APAP Pill/Patch Count:  0 of 90 pills remain Pill/Patch Appearance: Markings consistent with prescribed medication Bottle Appearance: Standard pharmacy container. Clearly labeled. Filled Date: 73 / 22 / 2022 Last Medication intake:  Today   UDS:  Summary  Date Value Ref Range Status  03/19/2020 Note  Final    Comment:    ==================================================================== ToxASSURE Select 13 (MW) ==================================================================== Test                             Result       Flag       Units  Drug Present and Declared for Prescription Verification   Tramadol                       >3497        EXPECTED   ng/mg creat   O-Desmethyltramadol            >3497        EXPECTED   ng/mg creat   N-Desmethyltramadol            >3497        EXPECTED   ng/mg creat    Source of tramadol is a prescription medication. O-desmethyltramadol    and N-desmethyltramadol are expected metabolites of tramadol.  ==================================================================== Test                      Result    Flag   Units      Ref Range   Creatinine              143              mg/dL      >=20 ==================================================================== Declared Medications:  The flagging and interpretation on this report are based on the  following declared medications.  Unexpected results may arise from  inaccuracies in the declared medications.   **Note: The testing scope of this panel includes these medications:   Tramadol (Ultram)   **Note: The testing scope of this panel does not include the  following reported medications:   Amlodipine (Norvasc)  Aspirin  Atorvastatin (Lipitor)  Cetirizine (Zyrtec)  Diclofenac  Docusate (Colace)  Duloxetine (Cymbalta)  Famotidine (Pepcid)  Hydralazine (Apresoline)   Hydrochlorothiazide (Hydrodiuril)  Lisinopril (Zestril)  Metformin (Glucophage)  Omeprazole (Prilosec)  Pantoprazole (Protonix) ==================================================================== For clinical consultation, please call [phone]. ====================================================================      ROS  Constitutional: Denies any fever or chills Gastrointestinal: No reported hemesis, hematochezia, vomiting, or acute GI distress Musculoskeletal:  Low back, bilateral knee pain, left greater than right Neurological: No reported episodes of acute onset apraxia, aphasia, dysarthria, agnosia, amnesia, paralysis, loss of coordination, or loss of consciousness  Medication Review  DULoxetine, HYDROcodone-acetaminophen, Magnesium, amLODipine, atorvastatin, cholecalciferol, diclofenac Sodium, docusate sodium, enoxaparin, famotidine, glucosamine-chondroitin, hydrALAZINE, lisinopril, metFORMIN, methocarbamol, multivitamin with minerals, and omeprazole  History Review  Allergy: Jean Stout is allergic to gadolinium derivatives, iodinated contrast media, and bextra [valdecoxib]. Drug: Jean Stout  reports no history of drug use. Alcohol:  reports current alcohol use of about 1.0 standard drink per week. Tobacco:  reports that she has never smoked. She has never used smokeless tobacco. Social: Ms. Waymon  reports that  she has never smoked. She has never used smokeless tobacco. She reports current alcohol use of about 1.0 standard drink per week. She reports that she does not use drugs. Medical:  has a past medical history of Anxiety and depression, Aortic stenosis, Arthritis, Cardiac murmur, CKD (chronic kidney disease), stage III ([place]), Coronary artery disease, GERD (gastroesophageal reflux disease), History of 2019 novel coronavirus disease (COVID-19) (12/29/2020), History of MI (myocardial infarction) (2010), HLD (hyperlipidemia), HTN (hypertension), IDA (iron deficiency anemia),  Mild mitral regurgitation, Myocardial infarction ([place]) (2010), OSA on CPAP, Osteopenia, PONV (postoperative nausea and vomiting), T2DM (type 2 diabetes mellitus) ([place]), and Vaginal prolapse. Surgical: Jean Stout  has a past surgical history that includes Carotid stent; Joint replacement (Bilateral, 2007); Coronary angioplasty with stent (N/A, 2010); Eye surgery; Carpal tunnel release (Left, 1983); Cyst excision (1984); Kyphoplasty (N/A, 09/08/2020); Total knee arthroplasty (Left, 01/12/2021); and Total knee arthroplasty (Right, 04/27/2021). Family: family history includes Anxiety disorder in her mother; Bipolar disorder in her sister; Cancer in her daughter; Heart attack in her father and son; Hyperlipidemia in her son; Hypertension in her mother and son; Sleep apnea in her son; Sudden Cardiac Death (age of onset: 36) in her father; Sudden Cardiac Death (age of onset: 58) in her brother.   Recent Imaging Review  DG Knee 1-2 Views Right CLINICAL DATA:  Right knee arthroplasty  EXAM: RIGHT KNEE - 1-2 VIEW  COMPARISON:  None.  FINDINGS: Status post right knee arthroplasty with expected overlying postoperative change. No evidence of perihardware fracture or component malpositioning.  IMPRESSION: Status post right knee arthroplasty with expected overlying postoperative change. No evidence of perihardware fracture or component malpositioning.  Electronically Signed   By: Delanna Ahmadi M.D.   On: 04/27/2021 09:56  Note: Reviewed        Physical Exam  General appearance: Well nourished, well developed, and well hydrated. In no apparent acute distress Mental status: Alert, oriented x 3 (person, place, & time)       Respiratory: No evidence of acute respiratory distress Eyes: PERLA Vitals: BP 135/60    Pulse 67    Temp 99 F (37.2 C) (Oral)    Resp 18    Ht 5' (1.524 m)    Wt 173 lb (78.5 kg)    SpO2 98%    BMI 33.79 kg/m  BMI: Estimated body mass index is 33.79 kg/m as calculated from the  following:   Height as of this encounter: 5' (1.524 m).   Weight as of this encounter: 173 lb (78.5 kg). Ideal: Ideal body weight: 45.5 kg (100 lb 4.9 oz) Adjusted ideal body weight: 58.7 kg (129 lb 6.2 oz)  Low back pain, pain with facet loading Bilateral knee pain, mild right knee swelling Varicose veins 5 out of 5 strength bilateral lower extremity: Plantar flexion, dorsiflexion, knee flexion, knee extension.   Assessment   Status Diagnosis  Controlled Controlled Controlled 1. Bilateral primary osteoarthritis of knee   2. Localized primary osteoarthritis of shoulder regions, bilateral   3. Chronic pain of right ankle   4. MDD (major depressive disorder), recurrent episode, moderate ([place])   5. Primary osteoarthritis of left knee   6. Chronic pain syndrome       Plan of Care    Jean Stout has a current medication list which includes the following long-term medication(s): amlodipine, atorvastatin, duloxetine, famotidine, hydralazine, lisinopril, metformin, omeprazole, enoxaparin, and enoxaparin.  Pharmacotherapy (Medications Ordered): Meds ordered this encounter  Medications   HYDROcodone-acetaminophen (NORCO) 7.5-325 MG tablet  Sig: Take 1 tablet by mouth every 6 (six) hours as needed for severe pain (pain score 7-10).    Dispense:  120 tablet    Refill:  0   HYDROcodone-acetaminophen (NORCO) 7.5-325 MG tablet    Sig: Take 1 tablet by mouth every 8 (eight) hours as needed for severe pain (pain score 7-10).    Dispense:  90 tablet    Refill:  0   HYDROcodone-acetaminophen (NORCO) 7.5-325 MG tablet    Sig: Take 1 tablet by mouth every 12 (twelve) hours as needed for severe pain (pain score 7-10).    Dispense:  60 tablet    Refill:  0   methocarbamol (ROBAXIN) 500 MG tablet    Sig: Take 1 tablet (500 mg total) by mouth every 8 (eight) hours as needed for muscle spasms.    Dispense:  90 tablet    Refill:  2   Orders:  Orders Placed This Encounter   Procedures   ToxASSURE Select 13 (MW), Urine    Volume: 30 ml(s). Minimum 3 ml of urine is needed. Document temperature of fresh sample. Indications: Long term (current) use of opiate analgesic [phone])    Order Specific Question:   Release to patient    Answer:   Immediate    Follow-up plan:   Return in about 3 months (around 08/14/2021) for Medication Management, in person.     Left intra-articular knee steroid injection on 05/27/2019, bilateral knee steroid injection 08/21/2019, 11/13/19, 02/05/20.  Has previously had 5 series of viscosupplementation at outside clinic.  States it was not effective.  Status post genicular nerve block which was not effective.  Can consider sprint PNS in future     Recent Visits No visits were found meeting these conditions. Showing recent visits within past 90 days and meeting all other requirements Today's Visits Date Type Provider Dept  05/17/21 Office Visit Gillis Santa, MD [facility]  Showing today's visits and meeting all other requirements Future Appointments Date Type Provider Dept  08/10/21 Appointment Gillis Santa, MD [facility]  Showing future appointments within next 90 days and meeting all other requirements  I discussed the assessment and treatment plan with the patient. The patient was provided an opportunity to ask questions and all were answered. The patient agreed with the plan and demonstrated an understanding of the instructions.  Patient advised to call back or seek an in-person evaluation if the symptoms or condition worsens.  Duration of encounter: 44minutes.  Note by: Gillis Santa, MD Date: 05/17/2021; Time: 3:00 PM

## 2021-05-21 LAB — TOXASSURE SELECT 13 (MW), URINE

## 2021-05-24 ENCOUNTER — Other Ambulatory Visit: Payer: Self-pay | Admitting: Orthopedic Surgery

## 2021-05-25 ENCOUNTER — Encounter
Admission: RE | Admit: 2021-05-25 | Discharge: 2021-05-25 | Disposition: A | Discharge status: Home / Self Care | Payer: Medicare Other | Source: Ambulatory Visit | Attending: Orthopedic Surgery | Admitting: Orthopedic Surgery

## 2021-05-25 ENCOUNTER — Other Ambulatory Visit: Payer: Self-pay

## 2021-05-25 NOTE — Patient Instructions (Addendum)
Your procedure is scheduled on: Thursday, March 2 Report to the Registration Desk on the 1st floor of the [facility]. To find out your arrival time, please call [phone] between 1PM - 3PM on: Wednesday, March 1  REMEMBER: Instructions that are not followed completely may result in serious medical risk, up to and including death; or upon the discretion of your surgeon and anesthesiologist your surgery may need to be rescheduled.  Do not eat food after midnight the night before surgery.  No gum chewing, lozengers or hard candies.  You may however, drink CLEAR liquids up to 2 hours before you are scheduled to arrive for your surgery. Do not drink anything within 2 hours of your scheduled arrival time.  Clear liquids include: - water  - apple juice without pulp - gatorade (not RED colors) - black coffee or tea (Do NOT add milk or creamers to the coffee or tea) Do NOT drink anything that is not on this list.  In addition, your doctor has ordered for you to drink the provided  G-2 Drinking this carbohydrate drink up to two hours before surgery helps to reduce insulin resistance and improve patient outcomes. Please complete drinking 2 hours prior to scheduled arrival time.  TAKE THESE MEDICATIONS THE MORNING OF SURGERY WITH A SIP OF WATER:  Amlodipine Duloxetine (cymbalta) Hydralazine Omeprazole (prilosec)  Stop metformin 2 days prior to surgery; last day to take is Monday, February 27. Resume AFTER surgery.  One week prior to surgery: Stop Anti-inflammatories (NSAIDS) such as Advil, Aleve, Ibuprofen, Motrin, Naproxen, Naprosyn and Aspirin based products such as Excedrin, Goodys Powder, BC Powder. Stop ANY OVER THE COUNTER supplements until after surgery. You may however, continue to take Tylenol if needed for pain up until the day of surgery.  No Alcohol for 24 hours before or after surgery.  No Smoking including e-cigarettes for 24 hours prior to surgery.  No chewable  tobacco products for at least 6 hours prior to surgery.  No nicotine patches on the day of surgery.  Do not use any "recreational" drugs for at least a week prior to your surgery.  Please be advised that the combination of cocaine and anesthesia may have negative outcomes, up to and including death. If you test positive for cocaine, your surgery will be cancelled.  On the morning of surgery brush your teeth with toothpaste and water, you may rinse your mouth with mouthwash if you wish. Do not swallow any toothpaste or mouthwash.  Do not wear jewelry, make-up, hairpins, clips or nail polish.  Do not wear lotions, powders, or perfumes.   Do not shave body from the neck down 48 hours prior to surgery just in case you cut yourself which could leave a site for infection.   Contact lenses, hearing aids and dentures may not be worn into surgery.  Do not bring valuables to the hospital. [facility] is not responsible for any missing/lost belongings or valuables.   Notify your doctor if there is any change in your medical condition (cold, fever, infection).  Wear comfortable clothing (specific to your surgery type) to the hospital.  After surgery, you can help prevent lung complications by doing breathing exercises.  Take deep breaths and cough every 1-2 hours. Your doctor may order a device called an Incentive Spirometer to help you take deep breaths.  If you are being discharged the day of surgery, you will not be allowed to drive home. You will need a responsible adult (18 years or  older) to drive you home and stay with you that night.   If you are taking public transportation, you will need to have a responsible adult (18 years or older) with you. Please confirm with your physician that it is acceptable to use public transportation.   Please call the [place] [facility]. at [phone] if you have any questions about these instructions.  Surgery Visitation Policy:  Patients  undergoing a surgery or procedure may have one family member or support person with them as long as that person is not COVID-19 positive or experiencing its symptoms.  That person may remain in the waiting area during the procedure and may rotate out with other people.

## 2021-05-26 MED ORDER — CHLORHEXIDINE GLUCONATE 0.12 % MT SOLN: 15.0000 mL | Freq: Once | OROMUCOSAL | Status: AC

## 2021-05-26 MED ORDER — SODIUM CHLORIDE 0.9 % IV SOLN: INTRAVENOUS | Status: DC

## 2021-05-26 MED ORDER — ORAL CARE MOUTH RINSE
15.0000 mL | Freq: Once | OROMUCOSAL | Status: AC
Start: 2021-05-26 — End: 2021-05-27

## 2021-05-27 ENCOUNTER — Ambulatory Visit
Admission: RE | Admit: 2021-05-27 | Discharge: 2021-05-27 | Disposition: A | Discharge status: Home / Self Care | Payer: Medicare Other | Attending: Orthopedic Surgery | Admitting: Orthopedic Surgery

## 2021-05-27 ENCOUNTER — Encounter: Payer: Self-pay | Admitting: Orthopedic Surgery

## 2021-05-27 ENCOUNTER — Encounter
Admission: RE | Disposition: A | Discharge status: Home / Self Care | Payer: Self-pay | Source: Home / Self Care | Attending: Orthopedic Surgery

## 2021-05-27 ENCOUNTER — Ambulatory Visit: Payer: Medicare Other | Admitting: Certified Registered"

## 2021-05-27 ENCOUNTER — Other Ambulatory Visit: Payer: Self-pay

## 2021-05-27 DIAGNOSIS — G4733 Obstructive sleep apnea (adult) (pediatric): Secondary | ICD-10-CM | POA: Insufficient documentation

## 2021-05-27 DIAGNOSIS — M1990 Unspecified osteoarthritis, unspecified site: Secondary | ICD-10-CM | POA: Insufficient documentation

## 2021-05-27 DIAGNOSIS — E1122 Type 2 diabetes mellitus with diabetic chronic kidney disease: Secondary | ICD-10-CM | POA: Insufficient documentation

## 2021-05-27 DIAGNOSIS — I129 Hypertensive chronic kidney disease with stage 1 through stage 4 chronic kidney disease, or unspecified chronic kidney disease: Secondary | ICD-10-CM | POA: Insufficient documentation

## 2021-05-27 DIAGNOSIS — I2510 Atherosclerotic heart disease of native coronary artery without angina pectoris: Secondary | ICD-10-CM | POA: Insufficient documentation

## 2021-05-27 DIAGNOSIS — E785 Hyperlipidemia, unspecified: Secondary | ICD-10-CM | POA: Insufficient documentation

## 2021-05-27 DIAGNOSIS — Z96651 Presence of right artificial knee joint: Secondary | ICD-10-CM | POA: Diagnosis not present

## 2021-05-27 DIAGNOSIS — Z8616 Personal history of COVID-19: Secondary | ICD-10-CM | POA: Diagnosis not present

## 2021-05-27 DIAGNOSIS — I252 Old myocardial infarction: Secondary | ICD-10-CM | POA: Diagnosis not present

## 2021-05-27 DIAGNOSIS — Z7984 Long term (current) use of oral hypoglycemic drugs: Secondary | ICD-10-CM | POA: Diagnosis not present

## 2021-05-27 DIAGNOSIS — K219 Gastro-esophageal reflux disease without esophagitis: Secondary | ICD-10-CM | POA: Insufficient documentation

## 2021-05-27 DIAGNOSIS — N1831 Chronic kidney disease, stage 3a: Secondary | ICD-10-CM | POA: Insufficient documentation

## 2021-05-27 DIAGNOSIS — M24661 Ankylosis, right knee: Secondary | ICD-10-CM | POA: Insufficient documentation

## 2021-05-27 HISTORY — PX: KNEE CLOSED REDUCTION: SHX995

## 2021-05-27 LAB — GLUCOSE, CAPILLARY
Glucose-Capillary: 119 mg/dL — ABNORMAL HIGH (ref 70–99)
Glucose-Capillary: 106 mg/dL — ABNORMAL HIGH (ref 70–99)

## 2021-05-27 SURGERY — MANIPULATION, KNEE, CLOSED
Anesthesia: General | Site: Knee | Laterality: Right

## 2021-05-27 MED ORDER — PROPOFOL 10 MG/ML IV BOLUS
INTRAVENOUS | Status: DC | PRN
  Administered 2021-05-27: 100 mg via INTRAVENOUS

## 2021-05-27 MED ORDER — LIDOCAINE HCL (CARDIAC) PF 100 MG/5ML IV SOSY
PREFILLED_SYRINGE | INTRAVENOUS | Status: DC | PRN
  Administered 2021-05-27: 80 mg via INTRAVENOUS

## 2021-05-27 MED ORDER — CHLORHEXIDINE GLUCONATE 0.12 % MT SOLN
OROMUCOSAL | Status: AC
  Administered 2021-05-27: 15 mL via OROMUCOSAL
  Filled 2021-05-27: qty 15

## 2021-05-27 SURGICAL SUPPLY — 3 items
KIT TURNOVER KIT A (KITS) ×1 IMPLANT
MANIFOLD NEPTUNE II (INSTRUMENTS) ×1 IMPLANT
WATER STERILE IRR 500ML POUR (IV SOLUTION) ×1 IMPLANT

## 2021-05-27 NOTE — Transfer of Care (Signed)
Immediate Anesthesia Transfer of Care Note ? ?Patient: Jean Stout ? ?Procedure(s) Performed: CLOSED MANIPULATION KNEE (Right: Knee) ? ?Patient Location: PACU ? ?Anesthesia Type:MAC ? ?Level of Consciousness: awake, alert  and oriented ? ?Airway & Oxygen Therapy: Patient Spontanous Breathing and Patient connected to nasal cannula oxygen ? ?Post-op Assessment: Report given to RN and Post -op Vital signs reviewed and stable ? ?Post vital signs: stable ? ?Last Vitals:  ?Vitals Value Taken Time  ?BP 135/67 05/27/21 0950  ?Temp    ?Pulse 56 05/27/21 0953  ?Resp 12 05/27/21 0953  ?SpO2 96 % 05/27/21 0953  ?Vitals shown include unvalidated device data. ? ?Last Pain:  ?Vitals:  ? 05/27/21 0831  ?TempSrc: Temporal  ?PainSc: 2   ?   ? ?  ? ?Complications: No notable events documented. ?

## 2021-05-27 NOTE — Discharge Instructions (Addendum)
Work hard on bending and straightening the knee. ?Let therapy know we are able to bend your knee back to 120 degrees in surgery and that is your goal for therapy. ?Call office if you need refill of pain medication. ? ?AMBULATORY SURGERY  ?DISCHARGE INSTRUCTIONS ? ? ?The drugs that you were given will stay in your system until tomorrow so for the next 24 hours you should not: ? ?Drive an automobile ?Make any legal decisions ?Drink any alcoholic beverage ? ? ?You may resume regular meals tomorrow.  Today it is better to start with liquids and gradually work up to solid foods. ? ?You may eat anything you prefer, but it is better to start with liquids, then soup and crackers, and gradually work up to solid foods. ? ? ?Please notify your doctor immediately if you have any unusual bleeding, trouble breathing, redness and pain at the surgery site, drainage, fever, or pain not relieved by medication. ? ? ? ?Additional Instructions: ? ? ? ?Please contact your physician with any problems or Same Day Surgery at [phone], Monday through Friday 6 am to 4 pm, or [place] [facility] number at [phone].  ?

## 2021-05-27 NOTE — H&P (Signed)
Chief Complaint  ?Patient presents with  ? Right Knee - Post Operative Visit  ? ? ?History of the Present Illness: ?Jean Stout is a 81 y.o. female here today.  ? ?The patient presents for follow-up evaluation status post right total knee arthroplasty performed on 04/27/2021. She is having a great deal of difficulty with motion. Clinically, she ran out of pain medication and has a pain contract. She did have good control initially, so she had poor management, and feels like she is much stiffer. She comes in to discuss. ? ?The patient states she was doing therapy, but they did not come last week, so she does not want them again. ? ?The patient is not diabetic ? ?I have reviewed past medical, surgical, social and family history, and allergies as documented in the EMR. ? ?Past Medical History: ?Past Medical History:  ?Diagnosis Date  ? Allergy  ? Anxiety  ? Arthritis  ? Depression  ? Diabetes mellitus without complication (CMS-HCC)  ? GERD (gastroesophageal reflux disease)  ? History of myocardial infarction  ? Hyperlipidemia  ? Hypertension  ? Sleep apnea  ? Vaginal prolapse  ? ?Past Surgical History: ?Past Surgical History:  ?Procedure Laterality Date  ? L2 FRACTURE 09/08/2020  ?Lumbar  ? ARTHROPLASTY TOTAL KNEE Left 01/12/2021  ?by Dr. Rudene Christians  ? ARTHROPLASTY TOTAL KNEE Right 04/27/2021  ?By Dr. Rudene Christians  ? CORONARY ANGIOPLASTY  ? KNEE ARTHROSCOPY  ? REPLACEMENT TOTAL HIP W/ RESURFACING IMPLANTS Bilateral  ?Right - January 2007; Left - March 2007  ? ?Past Family History: ?Family History  ?Problem Relation Age of Onset  ? High blood pressure (Hypertension) Mother  ? Anxiety Mother  ? Myocardial Infarction (Heart attack) Father  ? Sudden cardiac death Father 81  ? Hyperlipidemia (Elevated cholesterol) Sister  ? High blood pressure (Hypertension) Sister  ? Myocardial Infarction (Heart attack) Sister  ? Myocardial Infarction (Heart attack) Brother  ? High blood pressure (Hypertension) Brother  ? Sudden cardiac death Brother  26  ? Breast cancer Brother  ? Testicular cancer Brother  ? High blood pressure (Hypertension) Sister  ? Anxiety Sister  ? Breast cancer Daughter  ? Myocardial Infarction (Heart attack) Son  ? Hyperlipidemia (Elevated cholesterol) Son  ? Coronary Artery Disease (Blocked arteries around heart) Son  ? Hearing loss Brother  ? Atrial fibrillation (Abnormal heart rhythm sometimes requiring treatment with blood thinners) Brother  ? High blood pressure (Hypertension) Brother  ? Hyperlipidemia (Elevated cholesterol) Brother  ? Sleep apnea Son  ? Hyperlipidemia (Elevated cholesterol) Son  ? ?Medications: ?Current Outpatient Medications Ordered in Epic  ?Medication Sig Dispense Refill  ? amLODIPine (NORVASC) 5 MG tablet TAKE 1 TABLET BY MOUTH ONCE A DAY FOR HYPERTENSION 90 tablet 1  ? aspirin 81 MG EC tablet Take 1 tablet (81 mg total) by mouth once daily  ? atorvastatin (LIPITOR) 40 MG tablet TAKE 1 TABLET BY MOUTH AT BEDTIME 90 tablet 1  ? cholecalciferol (VITAMIN D3) 1000 unit tablet Take by mouth  ? diclofenac (VOLTAREN) 1 % topical gel Apply 2 g topically 3 (three) times daily  ? docusate (COLACE) 100 MG capsule Take 1 capsule by mouth 2 (two) times daily  ? DULoxetine (CYMBALTA) 60 MG DR capsule Take 2 capsules (120 mg total) by mouth once daily 180 capsule 1  ? famotidine (PEPCID) 40 MG tablet TAKE 1 TABLET BY MOUTH EVERY DAY AT NIGHT 90 tablet 1  ? glucosamine su 2KCl-chondroit 500-400 mg Tab Take by mouth  ? hydrALAZINE (APRESOLINE) 50  MG tablet TAKE 1 TABLET BY MOUTH THREE TIMES A DAY 270 tablet 3  ? HYDROcodone-acetaminophen (NORCO) 7.5-325 mg tablet Take 1 tablet by mouth every 6 (six) hours as needed for Pain (Patient taking differently: Take 1 tablet by mouth 2 (two) times daily) 30 tablet 0  ? lisinopriL (ZESTRIL) 40 MG tablet TAKE 1 TABLET BY MOUTH EVERY DAY 90 tablet 1  ? magnesium oxide 500 mg Cap Take by mouth  ? metFORMIN (GLUCOPHAGE-XR) 500 MG XR tablet TAKE 1 TABLET BY MOUTH EVERY DAY 90 tablet 1  ?  methocarbamoL (ROBAXIN) 500 MG tablet Take 1 tablet (500 mg total) by mouth 4 (four) times daily 60 tablet 0  ? multivitamin tablet Take 1 tablet by mouth once daily  ? omeprazole (PRILOSEC) 20 MG DR capsule Take 1 capsule (20 mg total) by mouth once daily 90 capsule 1  ? traZODone (DESYREL) 100 MG tablet Take 1 tablet (100 mg total) by mouth at bedtime for 30 days 30 tablet 2  ? ?No current Epic-ordered facility-administered medications on file.  ? ?Allergies: ?Allergies  ?Allergen Reactions  ? Gadolinium-Containing Contrast Media Rash  ? Iodinated Contrast Media Anaphylaxis  ? Bextra [Valdecoxib] Rash  ? Lotrel [Amlodipine-Benazepril] Rash  ? ? ?Body mass index is 30.8 kg/m?. ? ?Review of Systems: ?A comprehensive 14 point ROS was performed, reviewed, and the pertinent orthopaedic findings are documented in the HPI. ? ?Vitals:  ?05/24/21 1439  ?BP: 112/78  ? ? ?General Physical Examination:  ? ?General/Constitutional: No apparent distress: well-nourished and well developed. ?Eyes: Pupils equal, round with synchronous movement. ?Lungs: Clear to auscultation ?HEENT: Normal ?Vascular: No edema, swelling or tenderness, except as noted in detailed exam. ?Cardiac: Heart rate and rhythm is regular. ?Integumentary: No impressive skin lesions present, except as noted in detailed exam. ?Neuro/Psych: Normal mood and affect, oriented to person, place and time. ? ?On exam, right knee range of motion is 0-90 degrees. ? ?Radiographs: ? ?AP, lateral, standing, and sunrise x-rays of the right knee were ordered and personally reviewed today. These show normal alignment to the total knee. No periprosthetic complication. No evidence of anything being loose or out of place. Normal tracking of the patella. ? ?X-ray Impression: ?Stable appearance to right total knee. ? ?Assessment: ?ICD-10-CM  ?1. Status post total right knee replacement Z96.651  ?2. Type 2 diabetes mellitus with stage 3a chronic kidney disease, without long-term  current use of insulin (CMS-HCC) E11.22  ?N18.31  ?3. 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 ?Plan: ? ?The patient has clinical findings of stable right total knee status post arthroplasty. ? ?We discussed the patient's x-ray findings. I explained she has right knee arthrofibrosis. I recommend right knee manipulation. I explained the surgery and postoperative course in detail. ? ?We will schedule the patient for right knee manipulation in the near future. ? ?Surgical Risks: ? ?The nature of the condition and the proposed procedure has been reviewed in detail with the patient. Surgical versus non-surgical options and prognosis for recovery have been reviewed and the inherent risks and benefits of each have been discussed including the risks of infection, bleeding, injury to nerves/blood vessels/tendons, incomplete relief of symptoms, persisting pain and/or stiffness, loss of function, complex regional pain syndrome, failure of the procedure, as appropriate. ? ?I, Sowjanya Panditi/Jaimee Coe, Quality Documentation Specialist, completed documentation using DAX technology. ? ? ?Electronically signed by Marlena Clipper, MD at 05/24/2021 6:55 PM EST ? ?Reviewed  H+P. ?No changes noted. ? ? ?

## 2021-05-27 NOTE — Op Note (Signed)
05/27/2021 ? ?9:50 AM ? ?PATIENT:  Jean Stout  81 y.o. female ? ?PRE-OPERATIVE DIAGNOSIS:  Arthrofibrosis of knee joint, right  M24.661 ? ?POST-OPERATIVE DIAGNOSIS:  Arthrofibrosis of knee joint, right  M24.661 ? ?PROCEDURE:  Procedure(s): ?CLOSED MANIPULATION KNEE (Right) ? ?SURGEON: Leitha Schuller, MD ? ?ASSISTANTS: None ? ?ANESTHESIA:   general ? ?EBL:  No intake/output data recorded. ? ?BLOOD ADMINISTERED:none ? ?DRAINS: none  ? ?LOCAL MEDICATIONS USED:  NONE ? ?SPECIMEN:  No Specimen ? ?DISPOSITION OF SPECIMEN:  N/A ? ?COUNTS:  NO no count required with close procedure ? ?TOURNIQUET:  * No tourniquets in log * ? ?IMPLANTS: None ? ?DICTATION: .Dragon Dictation patient was brought to the operating room and after appropriate patient identification and timeout procedure completed with general anesthesia obtained the knee was brought up into flexion passive flexion was approximately 95 degrees at start of the case with just lacking a few degrees of extension.  With gentle pressure along the proximal tibia there were palpable adhesions which popped and the knee was brought back to 120 degrees of flexion.  Patient was then sent to recovery in stable condition no complications ? ?PLAN OF CARE: Discharge to home after PACU ? ?PATIENT DISPOSITION:  PACU - hemodynamically stable. ?  ? ?

## 2021-05-27 NOTE — Anesthesia Preprocedure Evaluation (Signed)
Anesthesia Evaluation  ?Patient identified by MRN, date of birth, ID band ?Patient awake ? ? ? ?Reviewed: ?Allergy & Precautions, NPO status , Patient's Chart, lab work & pertinent test results ? ?History of Anesthesia Complications ?(+) PONV and history of anesthetic complications ? ?Airway ?Mallampati: III ? ?TM Distance: <3 FB ?Neck ROM: full ? ? ? Dental ? ?(+) Missing ?  ?Pulmonary ?neg shortness of breath, sleep apnea ,  ?  ?Pulmonary exam normal ? ? ? ? ? ? ? Cardiovascular ?Exercise Tolerance: Good ?hypertension, (-) angina+ CAD and + Past MI  ?Normal cardiovascular exam ? ? ?  ?Neuro/Psych ?PSYCHIATRIC DISORDERS negative neurological ROS ?   ? GI/Hepatic ?Neg liver ROS, GERD  Controlled,  ?Endo/Other  ?diabetes, Type 2 ? Renal/GU ?Renal disease  ?negative genitourinary ?  ?Musculoskeletal ? ?(+) Arthritis ,  ? Abdominal ?  ?Peds ? Hematology ?negative hematology ROS ?(+)   ?Anesthesia Other Findings ?Past Medical History: ?No date: Anxiety and depression ?No date: Aortic stenosis ?No date: Arthritis ?No date: Cardiac murmur ?    Comment:  a.) Grade I/VI medium pitched mid systolic blowing at  ?             lower LSB ?No date: CKD (chronic kidney disease), stage III ([place]) ?No date: Coronary artery disease ?No date: GERD (gastroesophageal reflux disease) ?12/29/2020: History of 2019 novel coronavirus disease (COVID-19) ?    Comment:  a.) asymptomatic; PCR (+) on 12/29/2020 with  ?             confirmatory (+) rapid PCR on 12/30/2020. ?2010: History of MI (myocardial infarction) ?No date: HLD (hyperlipidemia) ?No date: HTN (hypertension) ?No date: IDA (iron deficiency anemia) ?No date: Mild mitral regurgitation ?2010: Myocardial infarction [facility]) ?    Comment:  a.) Tx'd at [place]; PCI with stents x 2  ?             (unknown type and location). ?No date: OSA on CPAP ?    Comment:  a.) not complaint with prescribed nocturnal PAP therapy ?No date: Osteopenia ?No  date: PONV (postoperative nausea and vomiting) ?No date: T2DM (type 2 diabetes mellitus) ([place]) ?No date: Vaginal prolapse ? ?Past Surgical History: ?No date: CAROTID STENT ?1983: CARPAL TUNNEL RELEASE; Left ?2010: CORONARY ANGIOPLASTY WITH STENT PLACEMENT; N/A ?    Comment:  stents x 2 placed (unknown type and location); Location: ?             [facility] ?1984: CYST EXCISION ?    Comment:  from Left side of neck ?No date: EYE SURGERY ?No date: JOINT REPLACEMENT; Bilateral ?    Comment:  hips & knees ?09/08/2020: KYPHOPLASTY; N/A ?    Comment:  Procedure: L2  KYPHOPLASTY;  Surgeon: Hessie Knows, MD; ?             Location: [facility] ORS;  Service: Orthopedics;  Laterality:  ?             N/A; ?03/2005: TOTAL HIP ARTHROPLASTY; Right ?05/2005: TOTAL HIP ARTHROPLASTY; Left ?01/12/2021: TOTAL KNEE ARTHROPLASTY; Left ?    Comment:  Procedure: TOTAL KNEE ARTHROPLASTY;  Surgeon: Rudene Christians,  ?             Legrand Como, MD;  Location: [facility] ORS;  Service: Orthopedics;  ?             Laterality: Left; ?04/27/2021: TOTAL KNEE ARTHROPLASTY; Right ?    Comment:  Procedure: TOTAL KNEE ARTHROPLASTY;  Surgeon: Rudene Christians,  ?  Legrand Como, MD;  Location: [facility] ORS;  Service: Orthopedics;  ?             Laterality: Right; ? ?BMI   ? Body Mass Index: 32.81 kg/m?  ?  ? ? Reproductive/Obstetrics ?negative OB ROS ? ?  ? ? ? ? ? ? ? ? ? ? ? ? ? ?  ?  ? ? ? ? ? ? ? ? ?Anesthesia Physical ?Anesthesia Plan ? ?ASA: 3 ? ?Anesthesia Plan: General  ? ?Post-op Pain Management:   ? ?Induction: Intravenous ? ?PONV Risk Score and Plan: Propofol infusion and TIVA ? ?Airway Management Planned: Natural Airway and Nasal Cannula ? ?Additional Equipment:  ? ?Intra-op Plan:  ? ?Post-operative Plan:  ? ?Informed Consent: I have reviewed the patients History and Physical, chart, labs and discussed the procedure including the risks, benefits and alternatives for the proposed anesthesia with the patient or authorized representative who has indicated his/her  understanding and acceptance.  ? ? ? ?Dental Advisory Given ? ?Plan Discussed with: Anesthesiologist, CRNA and Surgeon ? ?Anesthesia Plan Comments: (Patient consented for risks of anesthesia including but not limited to:  ?- adverse reactions to medications ?- risk of airway placement if required ?- damage to eyes, teeth, lips or other oral mucosa ?- nerve damage due to positioning  ?- sore throat or hoarseness ?- Damage to heart, brain, nerves, lungs, other parts of body or loss of life ? ?Patient voiced understanding.)  ? ? ? ? ? ? ?Anesthesia Quick Evaluation ? ?

## 2021-05-27 NOTE — Anesthesia Postprocedure Evaluation (Signed)
Anesthesia Post Note ? ?Patient: Jean Stout ? ?Procedure(s) Performed: CLOSED MANIPULATION KNEE (Right: Knee) ? ?Patient location during evaluation: PACU ?Anesthesia Type: General ?Level of consciousness: awake and alert ?Pain management: pain level controlled ?Vital Signs Assessment: post-procedure vital signs reviewed and stable ?Respiratory status: spontaneous breathing, nonlabored ventilation, respiratory function stable and patient connected to nasal cannula oxygen ?Cardiovascular status: blood pressure returned to baseline and stable ?Postop Assessment: no apparent nausea or vomiting ?Anesthetic complications: no ? ? ?No notable events documented. ? ? ?Last Vitals:  ?Vitals:  ? 05/27/21 1015 05/27/21 1023  ?BP: (!) 162/68 (!) 172/75  ?Pulse: (!) 59 61  ?Resp: 16 14  ?Temp: 36.8 ?C 36.9 ?C  ?SpO2: 97% 97%  ?  ?Last Pain:  ?Vitals:  ? 05/27/21 1023  ?TempSrc: Temporal  ?PainSc: 0-No pain  ? ? ?  ?  ?  ?  ?  ?  ? ?Jean Stout ? ? ? ? ?

## 2021-05-28 ENCOUNTER — Encounter: Payer: Self-pay | Admitting: Orthopedic Surgery

## 2021-07-14 ENCOUNTER — Telehealth: Payer: Self-pay | Admitting: Student in an Organized Health Care Education/Training Program

## 2021-07-14 NOTE — Telephone Encounter (Signed)
Patient called to say she is doing very well since having both knees replaced. She has weaned herself completely off of all pain meds. She is only taking tylenol arthritis as needed. She sounded so happy and excited to be feeling good again. Says to Thank everyone at the [facility] and tell Dr. Holley Raring she loves him and appreciates everything he did for her. She will call for an appointment if needed.  ?

## 2021-08-10 ENCOUNTER — Encounter: Payer: Medicare Other | Admitting: Student in an Organized Health Care Education/Training Program

## 2021-09-04 ENCOUNTER — Emergency Department
Admission: EM | Admit: 2021-09-04 | Discharge: 2021-09-04 | Disposition: A | Discharge status: Home / Self Care | Payer: Medicare Other | Attending: Emergency Medicine | Admitting: Emergency Medicine

## 2021-09-04 ENCOUNTER — Other Ambulatory Visit: Payer: Self-pay

## 2021-09-04 ENCOUNTER — Encounter: Payer: Self-pay | Admitting: Emergency Medicine

## 2021-09-04 DIAGNOSIS — R339 Retention of urine, unspecified: Secondary | ICD-10-CM | POA: Insufficient documentation

## 2021-09-04 DIAGNOSIS — Z960 Presence of urogenital implants: Secondary | ICD-10-CM | POA: Insufficient documentation

## 2021-09-04 NOTE — ED Notes (Signed)
Bladder Scan show 577mL in the bladder

## 2021-09-04 NOTE — ED Notes (Signed)
Pt asked to try again to urinate by EDP, pt attempted but stated she could not go, pt states she is very anxious to get home due to leaving her sister at home who has dementia. Pt states that she cannot stay for another bladder scan but will return if unable to urinate, pt walking towards door. Pt unwilling to stay to complete registration. Pt pacing and states she is very anxious about her sister. Pt given dc instructions and pt walked to lobby.

## 2021-09-04 NOTE — ED Notes (Signed)
Pt states she was able to urinate after EDP removed pessary. Pt with 193 ml in bladder per bladder scan. EDP notified.

## 2021-09-04 NOTE — Discharge Instructions (Signed)
Follow-up with your OB/GYN to discuss further steps due to the retention that was caused by the pessary drain.  Return to the ER for fevers worsening symptoms or any other concerns

## 2021-09-04 NOTE — ED Provider Notes (Deleted)
[facility] Provider Note    None    (approximate)   History   Urinary Retention   HPI  Jean Stout is a 81 y.o. female  with overactive bladder who had pessary check-patient reports having a pessary placed yesterday and since 6 PM has not been able to urinate.  She reports only a few dribbles.  She reports a little bit of pressure in her lower abdomen but denies any other concerns.  Denies ever having urinary retention previously      Physical Exam   Triage Vital Signs: ED Triage Vitals  Enc Vitals Group     BP 09/04/21 0951 (!) 178/79     Pulse Rate 09/04/21 0951 (!) 55     Resp 09/04/21 0951 20     Temp 09/04/21 0951 98.1 F (36.7 C)     Temp Source 09/04/21 0951 Oral     SpO2 09/04/21 0951 96 %     Weight 09/04/21 0949 168 lb (76.2 kg)     Height 09/04/21 0949 5' (1.524 m)     Head Circumference --      Peak Flow --      Pain Score 09/04/21 0949 0     Pain Loc --      Pain Edu? --      Excl. in GC? --     Most recent vital signs: Vitals:   09/04/21 0951  BP: (!) 178/79  Pulse: (!) 55  Resp: 20  Temp: 98.1 F (36.7 C)  SpO2: 96%     General: Awake, no distress.  CV:  Good peripheral perfusion.  Resp:  Normal effort.  Abd:  Soft and nontender Other:     ED Results / Procedures / Treatments  PROCEDURES:  Critical Care performed: No  .Foreign Body Removal  Date/Time: 09/04/2021 10:47 AM  Performed by: Concha Se, MD Authorized by: Concha Se, MD  Consent: Verbal consent obtained. Consent given by: patient  Sedation: Patient sedated: no  Post-procedure assessment: foreign body removed Comments: Remove pessary drain      MEDICATIONS ORDERED IN ED: Medications - No data to display   IMPRESSION / MDM / ASSESSMENT AND PLAN / ED COURSE  I reviewed the triage vital signs and the nursing notes.   Patient's presentation is most consistent with acute, uncomplicated illness.   I reviewed the note from  Dr. Feliberto Gottron yesterday at [facility]  Do not feel that labs are necessary given this was an acute onset and doubt any AKI or electrolyte abnormalities.  Denies any symptoms to suggest UTI or infection.  Discussed with Dr. Jean Rosenthal for OBGYN- okay to remove pessary dont need speculum and finger in to find it and remove   I suspect the retention is from the pessary.  Bladder scans are over 500 cc.  Pessary was removed without any issues and patient was able to urinate post bladder scan was normal patient requesting discharge  Patient able to urinate a large amount.  Post bladder scan had 190.  Recommended that she try to urinate again but that the fact that she was able to have a good amount of urine out afterwards is reassuring.  Patient does not want to stay to try to urinate again and will come back if she cannot urinate but is requesting discharge   FINAL CLINICAL IMPRESSION(S) / ED DIAGNOSES   Final diagnoses:  Presence of pessary  Urinary retention     Rx / DC Orders  ED Discharge Orders     None        Note:  This document was prepared using Dragon voice recognition software and may include unintentional dictation errors.   Concha Se, MD 09/04/21 1121

## 2021-09-04 NOTE — ED Triage Notes (Signed)
Pt via POV from home. Pt had a pessary inserted yesterday at 1330, since then she has been unable to pee. No other complaints. Denies abd pain. Denies NVD. Pt is A&Ox4 and NAD.

## 2021-09-13 NOTE — ED Provider Notes (Signed)
[facility] Provider Note    None    (approximate)   History   Urinary Retention   HPI  Jean Stout is a 81 y.o. female  with overactive bladder who had pessary check-patient reports having a pessary placed yesterday and since 6 PM has not been able to urinate.  She reports only a few dribbles.  She reports a little bit of pressure in her lower abdomen but denies any other concerns.  Denies ever having urinary retention previously      Physical Exam   Triage Vital Signs: ED Triage Vitals  Enc Vitals Group     BP 09/04/21 0951 (!) 178/79     Pulse Rate 09/04/21 0951 (!) 55     Resp 09/04/21 0951 20     Temp 09/04/21 0951 98.1 F (36.7 C)     Temp Source 09/04/21 0951 Oral     SpO2 09/04/21 0951 96 %     Weight 09/04/21 0949 168 lb (76.2 kg)     Height 09/04/21 0949 5' (1.524 m)     Head Circumference --      Peak Flow --      Pain Score 09/04/21 0949 0     Pain Loc --      Pain Edu? --      Excl. in GC? --     Most recent vital signs: Vitals:   09/04/21 0951  BP: (!) 178/79  Pulse: (!) 55  Resp: 20  Temp: 98.1 F (36.7 C)  SpO2: 96%     General: Awake, no distress.  CV:  Good peripheral perfusion.  Resp:  Normal effort.  Abd:  Soft and nontender Other:     ED Results / Procedures / Treatments  PROCEDURES:  Critical Care performed: No  Procedures   MEDICATIONS ORDERED IN ED: Medications - No data to display   IMPRESSION / MDM / ASSESSMENT AND PLAN / ED COURSE  I reviewed the triage vital signs and the nursing notes.   Patient's presentation is most consistent with acute, uncomplicated illness.   I reviewed the note from Dr. Feliberto Gottron yesterday at [facility]  Do not feel that labs are necessary given this was an acute onset and doubt any AKI or electrolyte abnormalities.  Denies any symptoms to suggest UTI or infection.  Discussed with Dr. Jean Rosenthal for OBGYN- okay to remove pessary dont need speculum and finger in to  find it and remove   I suspect the retention is from the pessary.  Bladder scans are over 500 cc.  Pessary was removed without any issues and patient was able to urinate post bladder scan was normal patient requesting discharge  Patient able to urinate a large amount.  Post bladder scan had 190.  Recommended that she try to urinate again but that the fact that she was able to have a good amount of urine out afterwards is reassuring.  Patient does not want to stay to try to urinate again and will come back if she cannot urinate but is requesting discharge   FINAL CLINICAL IMPRESSION(S) / ED DIAGNOSES   Final diagnoses:  Presence of pessary  Urinary retention     Rx / DC Orders   ED Discharge Orders     None        Note:  This document was prepared using Dragon voice recognition software and may include unintentional dictation errors.   Concha Se, MD 09/04/21 1121    Concha Se,  MD 09/13/21 1508

## 2021-11-11 IMAGING — CT CT L SPINE W/O CM
3 of 5 series · 12 of 33 positions shown, 13 images · non-contrast
Comparison: None.

CLINICAL DATA: Fall last week with right lower back pain.

EXAM:
CT LUMBAR SPINE WITHOUT CONTRAST
TECHNIQUE: Multidetector CT imaging of the lumbar spine was performed without
intravenous contrast administration. Multiplanar CT image
reconstructions were also generated.

[Series 5: l spine soft · axial · 0.33mm/px · z∈[-1028,-838]mm · 4 of 137 slices shown, 5 images]
[im 21/137  soft-tissue]
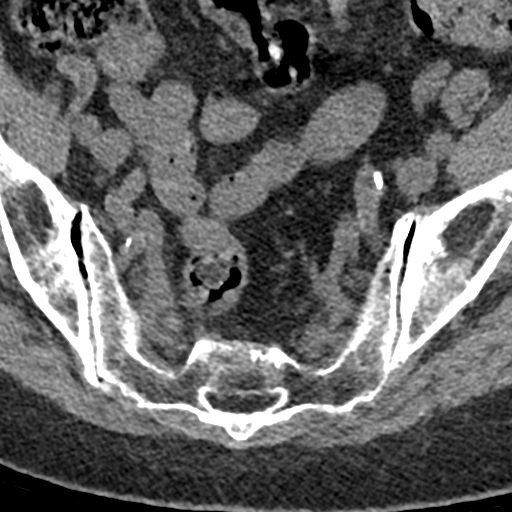
[im 21/137  bone]
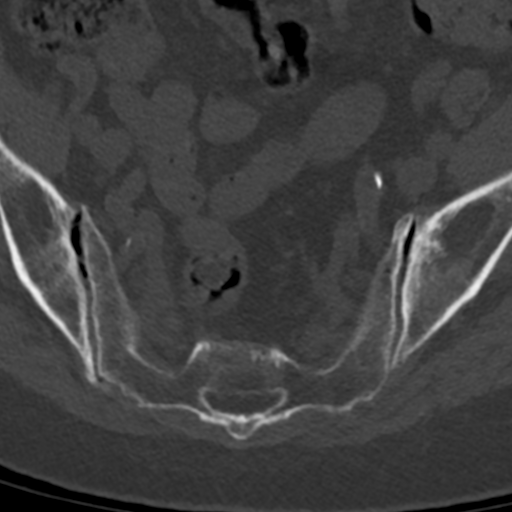
[im 53/137  bone]
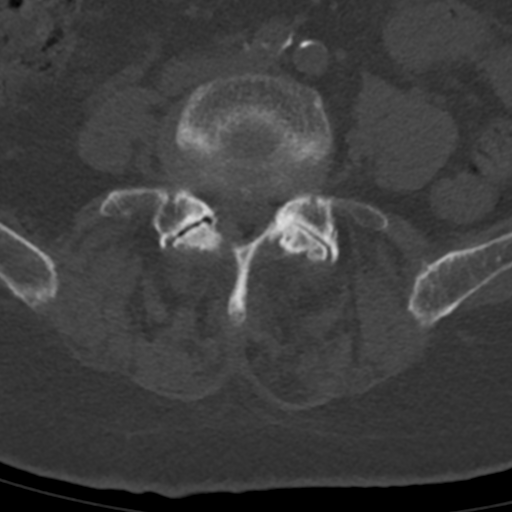
[im 84/137  bone]
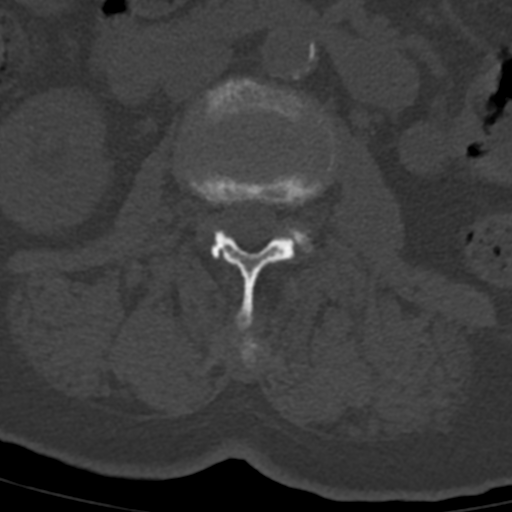
[im 116/137  bone]
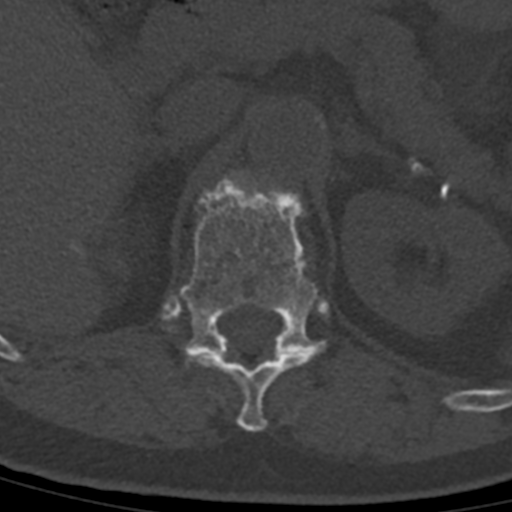

[Series 7: sagittal st · sagittal · 0.33mm/px · 5 of 103 slices shown]
[im 18/103  bone]
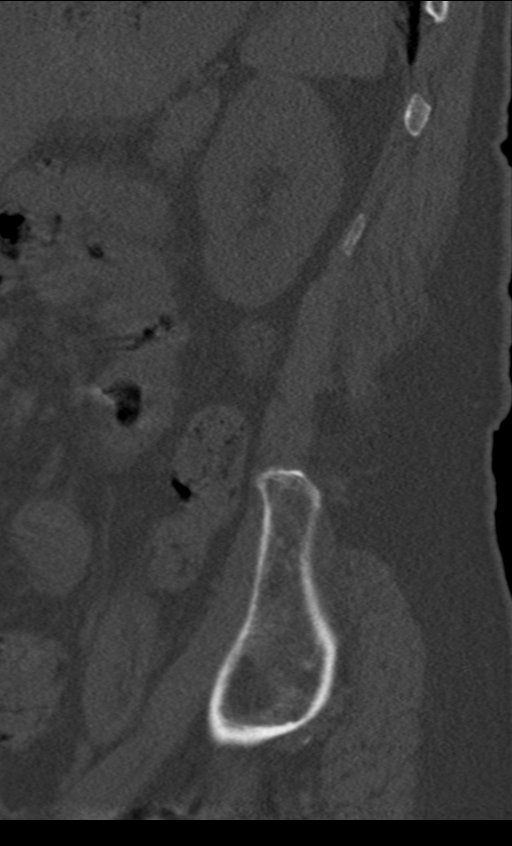
[im 35/103  bone]
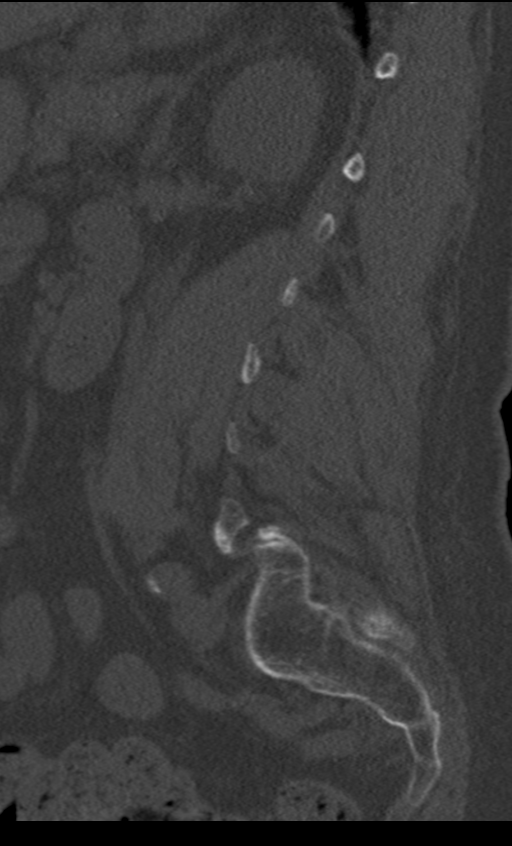
[im 52/103  bone]
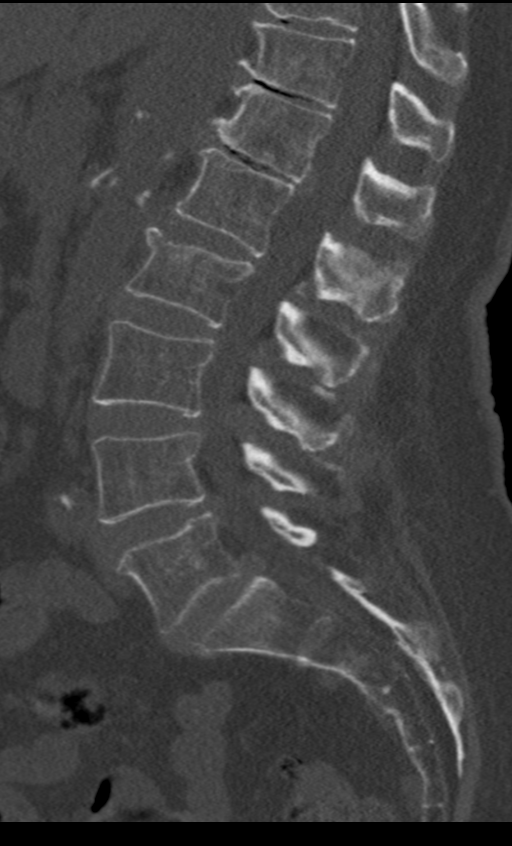
[im 69/103  bone]
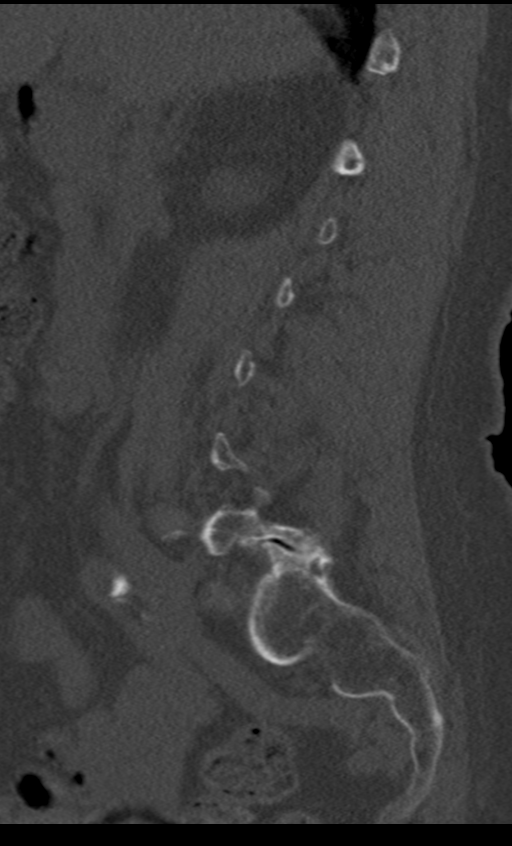
[im 86/103  bone]
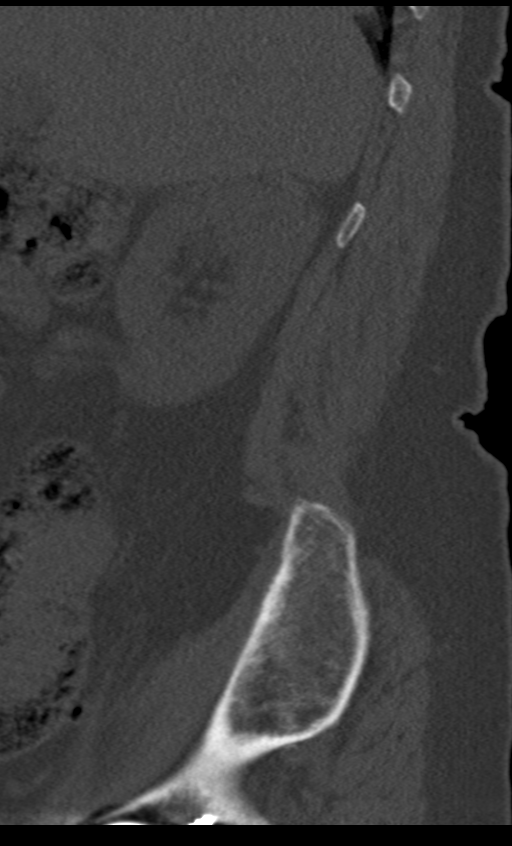

[Series 8: coronal bone · coronal · 0.38mm/px · 3 of 83 slices shown]
[im 21/83  bone]
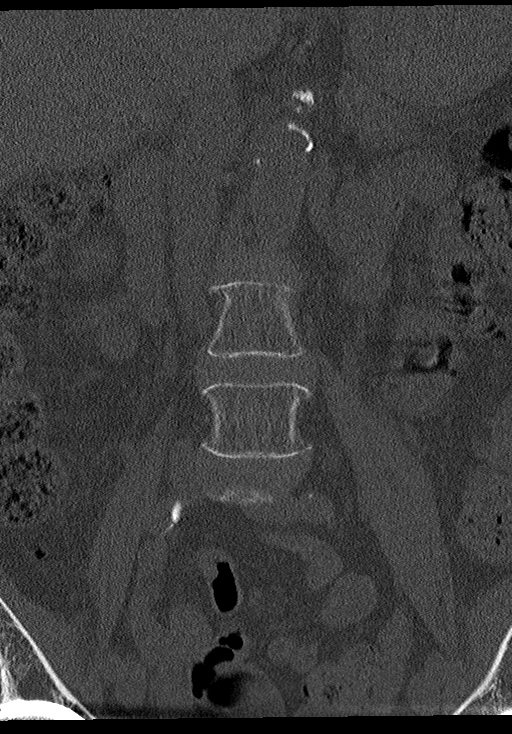
[im 42/83  bone]
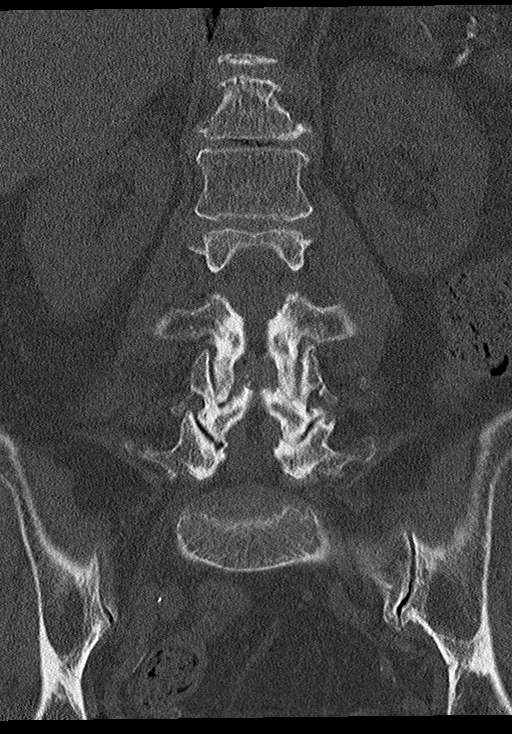
[im 62/83  bone]
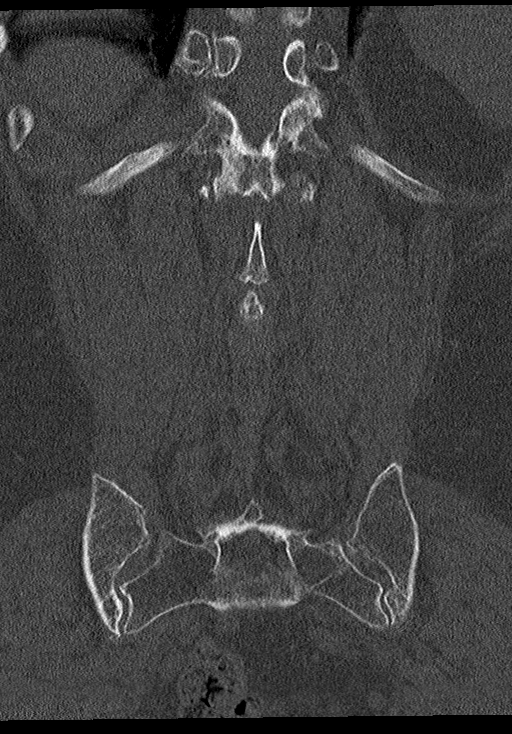

[12 of 33 positions shown; findings below may reference images not displayed]

FINDINGS: Segmentation: 5 non rib-bearing lumbar type vertebral bodies.

Alignment: Grade 1 anterolisthesis of L5 on S1, most likely
degenerative given severe facet arthropathy at this level.

Vertebrae: Acute L2 compression fracture with 20% height loss
anteriorly. There is a lucent fracture line through the
anterior/superior cortex with slight cortical offset. Slight
superior endplate trabecular sclerosis. No appreciable bony
retropulsion or evidence of involvement of the posterior elements.
Otherwise, vertebral body heights are maintained. Diffuse
osteopenia.

Paraspinal and other soft tissues: Aorto bi-iliac calcific
atherosclerosis. No acute findings.

Disc levels: Multilevel degenerative change with potentially severe
canal stenosis at L3-L4 and and least moderate canal stenosis at
L4-L5 with subarticular recess narrowing. Severe lower lumbar facet
hypertrophy with potentially severe foraminal stenosis on the left
at L4-L5.
IMPRESSION: 1. Acute L2 superior endplate fracture with 20% vertebral body
height loss. No appreciable bony retropulsion.
2. Multilevel degenerative change with likely severe canal stenosis
at L3-L4, at least moderate canal stenosis at L4-L5, and potentially
severe left foraminal stenosis at L4-L5. An MRI could better
characterize the canal/cord/foramina if clinically indicated.
3. Grade 1 anterolisthesis of L5 on S1, most likely degenerative
given severe facet arthropathy at this level.
4. Diffuse osteopenia.

## 2021-12-20 ENCOUNTER — Ambulatory Visit: Payer: Medicare Other | Admitting: Obstetrics and Gynecology

## 2021-12-22 ENCOUNTER — Encounter: Payer: Self-pay | Admitting: Obstetrics and Gynecology

## 2021-12-22 ENCOUNTER — Ambulatory Visit (INDEPENDENT_AMBULATORY_CARE_PROVIDER_SITE_OTHER): Payer: Medicare Other | Admitting: Obstetrics and Gynecology

## 2021-12-22 ENCOUNTER — Other Ambulatory Visit (HOSPITAL_COMMUNITY)
Admission: RE | Admit: 2021-12-22 | Discharge: 2021-12-22 | Disposition: A | Discharge status: Home / Self Care | Payer: Medicare Other | Attending: Obstetrics and Gynecology | Admitting: Obstetrics and Gynecology

## 2021-12-22 VITALS — BP 138/79 | HR 58 | Ht 60.5 in | Wt 191.0 lb

## 2021-12-22 DIAGNOSIS — N3000 Acute cystitis without hematuria: Secondary | ICD-10-CM | POA: Insufficient documentation

## 2021-12-22 DIAGNOSIS — N812 Incomplete uterovaginal prolapse: Secondary | ICD-10-CM

## 2021-12-22 DIAGNOSIS — N816 Rectocele: Secondary | ICD-10-CM

## 2021-12-22 DIAGNOSIS — R350 Frequency of micturition: Secondary | ICD-10-CM | POA: Diagnosis not present

## 2021-12-22 DIAGNOSIS — R159 Full incontinence of feces: Secondary | ICD-10-CM

## 2021-12-22 DIAGNOSIS — N3941 Urge incontinence: Secondary | ICD-10-CM

## 2021-12-22 LAB — POCT URINALYSIS DIPSTICK
Bilirubin, UA: NEGATIVE
Blood, UA: NEGATIVE
Glucose, UA: NEGATIVE
Ketones, UA: NEGATIVE
Nitrite, UA: POSITIVE
Protein, UA: NEGATIVE
Spec Grav, UA: 1.02 (ref 1.010–1.025)
Urobilinogen, UA: 0.2 E.U./dL
pH, UA: 6 (ref 5.0–8.0)

## 2021-12-22 MED ORDER — SULFAMETHOXAZOLE-TRIMETHOPRIM 800-160 MG PO TABS: 1.0000 | ORAL_TABLET | Freq: Two times a day (BID) | ORAL | 0 refills | Status: AC

## 2021-12-22 NOTE — Patient Instructions (Addendum)
Constipation: Our goal is to achieve formed bowel movements daily or every-other-day.  You may need to try different combinations of the following options to find what works best for you - everybody's body works differently so feel free to adjust the dosages as needed.  Some options to help maintain bowel health include:  Dietary changes (more leafy greens, vegetables and fruits; less processed foods) Fiber supplementation (Benefiber, FiberCon, Metamucil or Psyllium). Start slow and increase gradually to full dose. Over-the-counter agents such as: stool softeners (Docusate or Colace) and/or laxatives (Miralax, milk of magnesia)  "Power Pudding" is a natural mixture that may help your constipation.  To make blend 1 cup applesauce, 1 cup wheat bran, and 3/4 cup prune juice, refrigerate and then take 1 tablespoon daily with a large glass of water as needed.  Women should try to eat at least 21 to 25 grams of fiber a day, while men should aim for 30 to 38 grams a day. You can add fiber to your diet with food or a fiber supplement such as psyllium (metamucil), benefiber, or fibercon.   Here's a look at how much dietary fiber is found in some common foods. When buying packaged foods, check the Nutrition Facts label for fiber content. It can vary among brands.  Fruits Serving size Total fiber (grams)*  Raspberries 1 cup 8.0  Pear 1 medium 5.5  Apple, with skin 1 medium 4.5  Banana 1 medium 3.0  Orange 1 medium 3.0  Strawberries 1 cup 3.0   Vegetables Serving size Total fiber (grams)*  Green peas, boiled 1 cup 9.0  Broccoli, boiled 1 cup chopped 5.0  Turnip greens, boiled 1 cup 5.0  Brussels sprouts, boiled 1 cup 4.0  Potato, with skin, baked 1 medium 4.0  Sweet corn, boiled 1 cup 3.5  Cauliflower, raw 1 cup chopped 2.0  Carrot, raw 1 medium 1.5   Grains Serving size Total fiber (grams)*  Spaghetti, whole-wheat, cooked 1 cup 6.0  Barley, pearled, cooked 1 cup 6.0  Bran flakes 3/4 cup 5.5   Quinoa, cooked 1 cup 5.0  Oat bran muffin 1 medium 5.0  Oatmeal, instant, cooked 1 cup 5.0  Popcorn, air-popped 3 cups 3.5  Brown rice, cooked 1 cup 3.5  Bread, whole-wheat 1 slice 2.0  Bread, rye 1 slice 2.0   Legumes, nuts and seeds Serving size Total fiber (grams)*  Split peas, boiled 1 cup 16.0  Lentils, boiled 1 cup 15.5  Black beans, boiled 1 cup 15.0  Baked beans, canned 1 cup 10.0  Chia seeds 1 ounce 10.0  Almonds 1 ounce (23 nuts) 3.5  Pistachios 1 ounce (49 nuts) 3.0  Sunflower kernels 1 ounce 3.0  *Rounded to nearest 0.5 gram. Source: [employer] for [employer], [employer] have a stage 1 (out of 4) prolapse.  We discussed the fact that it is not life threatening but there are several treatment options. For treatment of pelvic organ prolapse, we discussed options for management including expectant management, conservative management, and surgical management, such as Kegels, a pessary, pelvic floor physical therapy, and specific surgical procedures.

## 2021-12-22 NOTE — Addendum Note (Signed)
Addended by: Elita Quick on: 12/22/2021 03:53 PM   Modules accepted: Orders

## 2021-12-22 NOTE — Progress Notes (Signed)
Urogynecology New Patient Evaluation and Consultation  Referring Provider: Gladstone Lighter, MD PCP: Gladstone Lighter, MD Date of Service: 12/22/2021  SUBJECTIVE Chief Complaint: New Patient (Initial Visit) Marland KitchenDeserie Tong is a 81 y.o. female here for a prolapse consult. Pt has fecal and urinary incontinence./)  History of Present Illness: Rennee Lienhart is a 81 y.o. White or Caucasian female seen in consultation at the request of Dr. Tressia Miners for evaluation of prolapse.    Review of records significant for: History of DM with A1c 6.8  Urinary Symptoms: Leaks urine with with movement to the bathroom and with urgency Leaks 1-3 time(s) per day  Pad use: 1 liners/ mini-pads per day.   She is not bothered by her UI symptoms. [facility] procedure in 1985  Day time voids 5.  Nocturia: 3 times per night to void. Voiding dysfunction: she empties her bladder well.  does not use a catheter to empty bladder.  When urinating, she feels a weak stream and difficulty starting urine stream  UTIs: 2 UTI's in the last year.  Denies symptoms of UTI Denies history of blood in urine and kidney or bladder stones. Has stage III CKD.   Pelvic Organ Prolapse Symptoms:                  She Admits to a feeling of a bulge the vaginal area. Has been an issue for several years.  She Admits to seeing a bulge.  This bulge is bothersome. Had prolapse repair- "bladder lifted to pelvic bone" in 1985 Tried a pessary a few months ago but was unable to urinate with it in place and had to go to the ER.   Bowel Symptom: Bowel movements: every day or every other  Stool consistency: hard- "little balls"- Bristol type 1 Straining: no.  Splinting: yes.  Incomplete evacuation: yes.  She Admits to accidental bowel leakage / fecal incontinence  Occurs: a few times week  Consistency with leakage: soft - little balls Bowel regimen: none   Sexual Function Sexually active: no.    Pelvic  Pain Denies pelvic pain   Past Medical History:  Past Medical History:  Diagnosis Date   Anxiety and depression    Aortic stenosis    Arthritis    Cardiac murmur    a.) Grade I/VI medium pitched mid systolic blowing at lower LSB   CKD (chronic kidney disease), stage III (HCC)    Coronary artery disease    GERD (gastroesophageal reflux disease)    History of 2019 novel coronavirus disease (COVID-19) 12/29/2020   a.) asymptomatic; PCR (+) on 12/29/2020 with confirmatory (+) rapid PCR on 12/30/2020.   History of MI (myocardial infarction) 2010   HLD (hyperlipidemia)    HTN (hypertension)    IDA (iron deficiency anemia)    Mild mitral regurgitation    Myocardial infarction [facility]) 2010   a.) Tx'd at [facility]; PCI with stents x 2 (unknown type and location).   OSA on CPAP    a.) not complaint with prescribed nocturnal PAP therapy   Osteopenia    PONV (postoperative nausea and vomiting)    T2DM (type 2 diabetes mellitus) ([place])    Vaginal prolapse      Past Surgical History:   Past Surgical History:  Procedure Laterality Date   CAROTID STENT     CARPAL TUNNEL RELEASE Left 1983   CORONARY ANGIOPLASTY WITH STENT PLACEMENT N/A 2010   stents x 2 placed (unknown type and location); Location: [facility]   CYST EXCISION  1984   from Left side of neck   EYE SURGERY     JOINT REPLACEMENT Bilateral    hips & knees   KNEE CLOSED REDUCTION Right 05/27/2021   Procedure: CLOSED MANIPULATION KNEE;  Surgeon: Hessie Knows, MD;  Location: [facility];  Service: Orthopedics;  Laterality: Right;   KYPHOPLASTY N/A 09/08/2020   Procedure: L2  KYPHOPLASTY;  Surgeon: Hessie Knows, MD;  Location: [facility];  Service: Orthopedics;  Laterality: N/A;   TOTAL HIP ARTHROPLASTY Right 03/2005   TOTAL HIP ARTHROPLASTY Left 05/2005   TOTAL KNEE ARTHROPLASTY Left 01/12/2021   Procedure: TOTAL KNEE ARTHROPLASTY;  Surgeon: Hessie Knows, MD;  Location: [facility];  Service: Orthopedics;   Laterality: Left;   TOTAL KNEE ARTHROPLASTY Right 04/27/2021   Procedure: TOTAL KNEE ARTHROPLASTY;  Surgeon: Hessie Knows, MD;  Location: [facility];  Service: Orthopedics;  Laterality: Right;     Past OB/GYN History: OB History  Gravida Para Term Preterm AB Living  3 3 3     3   SAB IAB Ectopic Multiple Live Births          3    # Outcome Date GA Lbr Len/2nd Weight Sex Delivery Anes PTL Lv  3 Term      Vag-Spont     2 Term      Vag-Spont     1 Term      Vag-Spont       Vaginal deliveries: 3,  Forceps/ Vacuum deliveries: 0, Cesarean section: 0 Menopausal: Denies vaginal bleeding since menopause Any history of abnormal pap smears: no.   Medications: She has a current medication list which includes the following prescription(s): amlodipine, atorvastatin, cholecalciferol, diclofenac sodium, docusate sodium, duloxetine, famotidine, glucosamine-chondroitin, hydralazine, lisinopril, magnesium, metformin, methocarbamol, multivitamin with minerals, omeprazole, and sulfamethoxazole-trimethoprim.   Allergies: Patient is allergic to gadolinium derivatives, iodinated contrast media, lotrel [amlodipine besy-benazepril hcl], and bextra [valdecoxib].   Social History:  Social History   Tobacco Use   Smoking status: Never   Smokeless tobacco: Never  Vaping Use   Vaping Use: Never used  Substance Use Topics   Alcohol use: Yes    Alcohol/week: 1.0 standard drink of alcohol    Types: 1 Glasses of wine per week    Comment: rarely   Drug use: Never    Relationship status: widowed She lives with her twin sister (sister has dementia and cares for her).   She is not employed. Regular exercise: Yes: walking her dog History of abuse: No  Family History:   Family History  Problem Relation Age of Onset   Hypertension Mother    Anxiety disorder Mother    Heart attack Father    Sudden Cardiac Death Father 62   Bipolar disorder Sister    Sudden Cardiac Death Brother 20   Cancer Daughter         Breast   Hypertension Son    Hyperlipidemia Son    Heart attack Son    Sleep apnea Son      Review of Systems: Review of Systems  Constitutional:  Negative for fever, malaise/fatigue and weight loss.  Respiratory:  Negative for cough, shortness of breath and wheezing.   Cardiovascular:  Negative for chest pain, palpitations and leg swelling.  Gastrointestinal:  Negative for abdominal pain and blood in stool.  Genitourinary:  Negative for dysuria.  Musculoskeletal:  Negative for myalgias.  Skin:  Negative for rash.  Neurological:  Negative for dizziness and headaches.  Endo/Heme/Allergies:  Does not bruise/bleed easily.  Psychiatric/Behavioral:  Negative for depression. The patient is nervous/anxious.      OBJECTIVE Physical Exam: Vitals:   12/22/21 1438  BP: 138/79  Pulse: (!) 58  Weight: 191 lb (86.6 kg)  Height: 5' 0.5" (1.537 m)    Physical Exam Constitutional:      General: She is not in acute distress. Pulmonary:     Effort: Pulmonary effort is normal.  Abdominal:     General: There is no distension.     Palpations: Abdomen is soft.     Tenderness: There is no abdominal tenderness. There is no rebound.  Musculoskeletal:        General: No swelling. Normal range of motion.  Skin:    General: Skin is warm and dry.     Findings: No rash.  Neurological:     Mental Status: She is alert and oriented to person, place, and time.  Psychiatric:        Mood and Affect: Mood normal.        Behavior: Behavior normal.     GU / Detailed Urogynecologic Evaluation:  Pelvic Exam: Normal external female genitalia; Bartholin's and Skene's glands normal in appearance; urethral meatus normal in appearance, no urethral masses or discharge.   CST: negative  Speculum exam reveals normal vaginal mucosa with atrophy. Cervix normal appearance. Uterus normal single, nontender. Adnexa no mass, fullness, tenderness.     Pelvic floor strength I/V  Pelvic floor musculature:  Right levator non-tender, Right obturator non-tender, Left levator non-tender, Left obturator non-tender  POP-Q:   POP-Q  -3                                            Aa   -3                                           Ba  -5                                              C   3                                            Gh  3                                            Pb  8                                            tvl   -2                                            Ap  -2  Bp  -7.5                                              D     Rectal Exam:  Normal sphincter tone  Post-Void Residual (PVR) by Bladder Scan: In order to evaluate bladder emptying, we discussed obtaining a postvoid residual and she agreed to this procedure.  Procedure: The ultrasound unit was placed on the patient's abdomen in the suprapubic region after the patient had voided. A PVR of 47 ml was obtained by bladder scan.  Laboratory Results: @ENCLABS @    ASSESSMENT AND PLAN Ms. Charter is a 81 y.o. with:  1. Uterovaginal prolapse, incomplete   2. Prolapse of posterior vaginal wall   3. Acute cystitis without hematuria   4. Urge incontinence   5. Incontinence of feces, unspecified fecal incontinence type    Stage I anterior, Stage I posterior, Stage I apical prolapse - mild prolapse present - For treatment of pelvic organ prolapse, we discussed options for management including expectant management, conservative management, and surgical management, such as Kegels, a pessary, pelvic floor physical therapy, and specific surgical procedures. - She is interested in expectant management at this time. Does not feel prolapse is bothersome enough for treatment.   2. UTI - nitrites on UA today, will send for culture - treat with bactrim DX x3 days  3. Urge incontinence We discussed the symptoms of overactive bladder (OAB), which include urinary urgency, urinary  frequency, nocturia, with or without urge incontinence.  While we do not know the exact etiology of OAB, several treatment options exist. We discussed management including behavioral therapy (decreasing bladder irritants, urge suppression strategies, timed voids, bladder retraining), physical therapy, medication.  - She is not interested in treatment at this time.   4. Accidental Bowel Leakage:  - Treatment options include anti-diarrhea medication (loperamide/ Imodium OTC or prescription lomotil), fiber supplements, physical therapy, and possible sacral neuromodulation or surgery.   - She will start with minimizing constipation by adding fiber supplement and miralax. Does not want pelvic PT at this time.   Return as needed  Jaquita Folds, MD

## 2021-12-25 LAB — URINE CULTURE: Culture: 50000 — AB

## 2022-01-14 ENCOUNTER — Ambulatory Visit: Payer: Medicare Other | Admitting: Obstetrics and Gynecology

## 2022-02-03 ENCOUNTER — Ambulatory Visit
Payer: Medicare Other | Attending: Student in an Organized Health Care Education/Training Program | Admitting: Student in an Organized Health Care Education/Training Program

## 2022-02-03 ENCOUNTER — Encounter: Payer: Self-pay | Admitting: Student in an Organized Health Care Education/Training Program

## 2022-02-03 VITALS — BP 160/78 | HR 62 | Temp 97.4°F | Resp 16 | Ht 60.0 in | Wt 183.0 lb

## 2022-02-03 DIAGNOSIS — G894 Chronic pain syndrome: Secondary | ICD-10-CM | POA: Insufficient documentation

## 2022-02-03 DIAGNOSIS — M51369 Other intervertebral disc degeneration, lumbar region without mention of lumbar back pain or lower extremity pain: Secondary | ICD-10-CM | POA: Insufficient documentation

## 2022-02-03 DIAGNOSIS — M47816 Spondylosis without myelopathy or radiculopathy, lumbar region: Secondary | ICD-10-CM | POA: Diagnosis present

## 2022-02-03 DIAGNOSIS — M5136 Other intervertebral disc degeneration, lumbar region: Secondary | ICD-10-CM | POA: Insufficient documentation

## 2022-02-03 DIAGNOSIS — Z9889 Other specified postprocedural states: Secondary | ICD-10-CM | POA: Diagnosis present

## 2022-02-03 MED ORDER — DIAZEPAM 5 MG PO TABS: 5.0000 mg | ORAL_TABLET | ORAL | 0 refills | Status: DC

## 2022-02-03 NOTE — Progress Notes (Signed)
PROVIDER NOTE: Information contained herein reflects review and annotations entered in association with encounter. Interpretation of such information and data should be left to medically-trained personnel. Information provided to patient can be located elsewhere in the medical record under "Patient Instructions". Document created using STT-dictation technology, any transcriptional errors that may result from process are unintentional.    Patient: Jean Stout  Service Category: E/M  Provider: Gillis Santa, MD  DOB: 11-18-1940  DOS: 02/03/2022  Referring Provider: Gladstone Lighter, MD  MRN: [id number]  Specialty: Interventional Pain Management  PCP: Gladstone Lighter, MD  Type: Established Patient  Setting: Ambulatory outpatient    Location: Office  Delivery: Face-to-face     HPI  Jean Stout, a 81 y.o. year old female, is here today because of her History of kyphoplasty [Z98.890]. Jean Stout's primary complain today is Back Pain (Lower, bilateral) and Hand Pain (Left , with numbness in fingers) Last encounter: My last encounter with her was on 05/17/2021 Pertinent problems: Jean Stout has Type 2 diabetes mellitus without complication, without long-term current use of insulin ([place]); Osteoarthritis of multiple joints; Primary osteoarthritis of left knee; Localized primary osteoarthritis of shoulder regions, bilateral; Right wrist pain; Chronic pain syndrome; Polyarthralgia; History of bilateral hip replacements; MDD (major depressive disorder), recurrent episode, moderate ([place]); GAD (generalized anxiety disorder); and Panic attacks on their pertinent problem list. Pain Assessment: Severity of Chronic pain is reported as a 7 /10. Location: Back Lower/denies. Onset: More than a month ago. Quality: Sharp. Timing: Constant. Modifying factor(s): flexing the spine, sitting. Vitals:  height is 5' (1.524 m) and weight is 183 lb (83 kg). Her temporal temperature is 97.4 F (36.3 C) (abnormal). Her blood  pressure is 160/78 (abnormal) and her pulse is 62. Her respiration is 16 and oxygen saturation is 98%.   Reason for encounter: patient-requested evaluation.   Patient was previously seeing me for knee pain.  She went on to have bilateral knee replacement last year.  She states that she is doing very well since her knee replacements.  She is not on any opioid analgesics.  She states that she is ambulating better.  Today her primary pain generator is her low back pain.  She does have a history of lumbar kyphoplasty for vertebral compression fracture of her lumbar spine.  She also has left hand pain with numbness in her fingers.  She is status post left carpal tunnel surgery.  We discussed working up her low back pain.  She has done physical therapy directed home exercises in the past with limited response.  Lumbar spine x-rays show lumbar spondylosis diffuse degenerative disc disease.  Recommend follow-up with lumbar MRI and then discussion of treatment plan  ROS  Constitutional: Denies any fever or chills Gastrointestinal: No reported hemesis, hematochezia, vomiting, or acute GI distress Musculoskeletal:  Low back pain, left hand pain Neurological: No reported episodes of acute onset apraxia, aphasia, dysarthria, agnosia, amnesia, paralysis, loss of coordination, or loss of consciousness  Medication Review  DULoxetine, Magnesium, amLODipine, atorvastatin, cholecalciferol, diazepam, diclofenac Sodium, docusate sodium, famotidine, glucosamine-chondroitin, hydrALAZINE, lisinopril, metFORMIN, methocarbamol, multivitamin with minerals, and omeprazole  History Review  Allergy: Jean Stout is allergic to gadolinium derivatives, iodinated contrast media, lotrel [amlodipine besy-benazepril hcl], and bextra [valdecoxib]. Drug: Jean Stout  reports no history of drug use. Alcohol:  reports current alcohol use of about 1.0 standard drink of alcohol per week. Tobacco:  reports that she has never smoked. She has  never used smokeless tobacco. Social: Jean Stout  reports that  she has never smoked. She has never used smokeless tobacco. She reports current alcohol use of about 1.0 standard drink of alcohol per week. She reports that she does not use drugs. Medical:  has a past medical history of Anxiety and depression, Aortic stenosis, Arthritis, Cardiac murmur, CKD (chronic kidney disease), stage III ([place]), Coronary artery disease, GERD (gastroesophageal reflux disease), History of 2019 novel coronavirus disease (COVID-19) (12/29/2020), History of MI (myocardial infarction) (2010), HLD (hyperlipidemia), HTN (hypertension), IDA (iron deficiency anemia), Mild mitral regurgitation, Myocardial infarction ([place]) (2010), OSA on CPAP, Osteopenia, PONV (postoperative nausea and vomiting), T2DM (type 2 diabetes mellitus) ([place]), and Vaginal prolapse. Surgical: Jean Stout  has a past surgical history that includes Carotid stent; Joint replacement (Bilateral); Coronary angioplasty with stent (N/A, 2010); Eye surgery; Carpal tunnel release (Left, 1983); Cyst excision (1984); Kyphoplasty (N/A, 09/08/2020); Total knee arthroplasty (Left, 01/12/2021); Total knee arthroplasty (Right, 04/27/2021); Total hip arthroplasty (Right, 03/2005); Total hip arthroplasty (Left, 05/2005); and Knee Closed Reduction (Right, 05/27/2021). Family: family history includes Anxiety disorder in her mother; Bipolar disorder in her sister; Cancer in her daughter; Heart attack in her father and son; Hyperlipidemia in her son; Hypertension in her mother and son; Sleep apnea in her son; Sudden Cardiac Death (age of onset: 44) in her father; Sudden Cardiac Death (age of onset: 97) in her brother.  Laboratory Chemistry Profile   Renal Lab Results  Component Value Date   BUN 18 04/28/2021   CREATININE 0.89 04/28/2021   GFRNONAA >60 04/28/2021    Hepatic Lab Results  Component Value Date   AST 27 04/15/2021   ALT 20 04/15/2021   ALBUMIN 4.1 04/15/2021    ALKPHOS 87 04/15/2021    Electrolytes Lab Results  Component Value Date   NA 134 (L) 04/28/2021   K 4.2 04/28/2021   CL 104 04/28/2021   CALCIUM 8.8 (L) 04/28/2021    Bone No results found for: "VD25OH", "VD125OH2TOT", "ZO1096EA5", "WU9811BJ4", "25OHVITD1", "25OHVITD2", "25OHVITD3", "TESTOFREE", "TESTOSTERONE"  Inflammation (CRP: Acute Phase) (ESR: Chronic Phase) No results found for: "CRP", "ESRSEDRATE", "LATICACIDVEN"       Note: Above Lab results reviewed.  Recent Imaging Review  DG Knee 1-2 Views Right CLINICAL DATA:  Right knee arthroplasty  EXAM: RIGHT KNEE - 1-2 VIEW  COMPARISON:  None.  FINDINGS: Status post right knee arthroplasty with expected overlying postoperative change. No evidence of perihardware fracture or component malpositioning.  IMPRESSION: Status post right knee arthroplasty with expected overlying postoperative change. No evidence of perihardware fracture or component malpositioning.  Electronically Signed   By: Delanna Ahmadi M.D.   On: 04/27/2021 09:56  CLINICAL DATA:  Surgery, elective. Additional history provided: L2 kyphoplasty. Provided fluoroscopy time 1 minutes, 20 seconds.   EXAM: LUMBAR SPINE - 2-3 VIEW; DG C-ARM 1-60 MIN   COMPARISON:  CT of the lumbar spine 08/23/2020.   FINDINGS: AP and lateral view intraprocedural fluoroscopic images of the lumbar spine are submitted, 2 images total. On the provided images, kyphoplasty material is present within the L2 vertebra (at site of a known compression fracture). There may be mild spread of kyphoplasty material posterior to the L2 vertebral body.   Table formatting from the original result was not included.  [place]   REFERRING: Tressia Miners  TECHNOLOGIST:    Johnnye Sima  HISTORY:  BASELINE BONE DENSITY. 81 YEAR OLD WHITE FEMALE.    SITE DATE BMD g/cm   T-SCORE Z-SCORE g/cm  CHANGE % CHANGE   STATISTICALLY       SIGNIFICANT?  LUMBAR  SPINE L1- L4                   HIP L.FEM. NECK                    L.TOTAL HIP                   FOREARM  L/R 33% 04-21-21 0.599 -1.6                 INTERPRETATION: Osteopenia/Low bone mass. Lumbar spine not reported due to  prior surgery.  Hips not reported due to prior bilateral hip surgery.     FRACTURE RISK ASSESSMENT (FRAX)  _____  10 year risk of hip fracture.      _____  10 year risk of any major  fracture.   TREATMENT:   The National Osteoporosis Foundation (2014) Treatment Guidelines for:  Consider FDA-approved medical therapies in postmenopausal women and men  aged 63 years and older, based on the following:   A hip or vertebral (clinical or morphometric) fracture   T-score ? -2.5 at the femoral neck or spine after appropriate evaluation  to exclude secondary causes   Low bone mass (T-score between -1.0 and -2.5 at the femoral neck or  spine) and a 10-year probability of a hip fracture ? 3% or a 10-year  probability of a major osteoporosis-related fracture ? 20% based on the  UA-adapted WHO algorithm   Clinicians judgment and/or patient preferences may indicate treatment  for people with 10-year fracture probabilities above or below these levels  BMD should be monitored two years after initiating therapy and at two-year  intervals thereafter.   _______________________________________  A. Lavone Orn, M.D.            Note: Reviewed        Physical Exam  General appearance: Well nourished, well developed, and well hydrated. In no apparent acute distress Mental status: Alert, oriented x 3 (person, place, & time)       Respiratory: No evidence of acute respiratory distress Eyes: PERLA Vitals: BP (!) 160/78   Pulse 62   Temp (!) 97.4 F (36.3 C) (Temporal)   Resp 16   Ht 5' (1.524 m)   Wt 183 lb (83 kg)   SpO2 98%   BMI 35.74 kg/m  BMI: Estimated body mass index is 35.74 kg/m as calculated from the  following:   Height as of this encounter: 5' (1.524 m).   Weight as of this encounter: 183 lb (83 kg). Ideal: Ideal body weight: 45.5 kg (100 lb 4.9 oz) Adjusted ideal body weight: 60.5 kg (133 lb 6.2 oz)  Left wrist pain, history of prior wrist surgery  Lumbar Spine Area Exam  Skin & Axial Inspection: Lumbar Scoliosis history of prior lumbar spine surgery Alignment: Scoliosis detected Functional ROM: Decreased ROM       Stability: No instability detected Muscle Tone/Strength: Functionally intact. No obvious neuro-muscular anomalies detected. Sensory (Neurological): Musculoskeletal pain pattern Palpation: No palpable anomalies       Provocative Tests: Hyperextension/rotation test: (+) bilaterally for facet joint pain. Lumbar quadrant test (Kemp's test): (+) bilaterally for facet joint pain.   Lower Extremity Exam  Side: Right lower extremity  Side: Left lower extremity  Stability: No instability observed          Stability: No instability observed          Skin & Extremity Inspection: Evidence of prior arthroplastic surgery  Skin & Extremity Inspection: Evidence of prior arthroplastic surgery  Functional ROM: Unrestricted ROM                  Functional ROM: Unrestricted ROM                  Muscle Tone/Strength: Functionally intact. No obvious neuro-muscular anomalies detected.  Muscle Tone/Strength: Functionally intact. No obvious neuro-muscular anomalies detected.  Sensory (Neurological): Unimpaired        Sensory (Neurological): Unimpaired        DTR: Patellar: deferred today Achilles: deferred today Plantar: deferred today  DTR: Patellar: deferred today Achilles: deferred today Plantar: deferred today  Palpation: No palpable anomalies  Palpation: No palpable anomalies    Assessment   Diagnosis Status  1. History of kyphoplasty   2. Lumbar facet arthropathy   3. Lumbar spondylosis   4. Lumbar degenerative disc disease   5. Chronic pain syndrome     Stable Worsening Worsened   Updated Problems: Problem  History of Kyphoplasty  Lumbar Spondylosis  Lumbar Degenerative Disc Disease     Plan of Care  Problem-specific:  No problem-specific Assessment & Plan notes found for this encounter.  Jean Stout has a current medication list which includes the following long-term medication(s): amlodipine, atorvastatin, duloxetine, famotidine, hydralazine, lisinopril, metformin, and omeprazole.  Pharmacotherapy (Medications Ordered): Meds ordered this encounter  Medications   diazepam (VALIUM) 5 MG tablet    Sig: Take 1 tablet (5 mg total) by mouth 60 (sixty) minutes before procedure for 1 dose. 1 tab PO 60 min pre-MRI.    Dispense:  1 tablet    Refill:  0    One time prescription. Do not request refills.   Orders:  Orders Placed This Encounter  Procedures   MR LUMBAR SPINE WO CONTRAST    Patient presents with axial pain with possible radicular component. Please assist [place] in identifying specific level(s) and laterality of any additional findings such as: 1. Facet (Zygapophyseal) joint DJD (Hypertrophy, space narrowing, subchondral sclerosis, and/or osteophyte formation) 2. DDD and/or IVDD (Loss of disc height, desiccation, gas patterns, osteophytes, endplate sclerosis, or "Black disc disease") 3. Pars defects 4. Spondylolisthesis, spondylosis, and/or spondyloarthropathies (include Degree/Grade of displacement in mm) (stability) 5. Vertebral body Fractures (acute/chronic) (state percentage of collapse) 6. Demineralization (osteopenia/osteoporotic) 7. Bone pathology 8. Foraminal narrowing  9. Surgical changes 10. Central, Lateral Recess, and/or Foraminal Stenosis (include AP diameter of stenosis in mm) 11. Surgical changes (hardware type, status, and presence of fibrosis) 12. Modic Type Changes (MRI only) 13. IVDD (Disc bulge, protrusion, herniation, extrusion) (Level, laterality, extent)    Standing Status:   Future     Standing Expiration Date:   03/05/2022    Scheduling Instructions:     Imaging must be done as soon as possible. Inform patient that order will expire within 30 days and I will not renew it.    Order Specific Question:   What is the patient's sedation requirement?    Answer:   No Sedation    Order Specific Question:   Does the patient have a pacemaker or implanted devices?    Answer:   No    Order Specific Question:   Preferred imaging location?  Answer:   [facility] (table limit - 550lbs)    Order Specific Question:   Call Results- Best Contact Number?    Answer:   [phone] ([facility])    Order Specific Question:   Radiology Contrast Protocol - do NOT remove file path    Answer:   _[email or web address]   Follow-up plan:   Return for patient will call to schedule F2F appt after MRI.     Left intra-articular knee steroid injection on 05/27/2019, bilateral knee steroid injection 08/21/2019, 11/13/19, 02/05/20.  Has previously had 5 series of viscosupplementation at outside clinic.  States it was not effective.  Status post genicular nerve block which was not effective.  Can consider sprint PNS in future      Recent Visits No visits were found meeting these conditions. Showing recent visits within past 90 days and meeting all other requirements Today's Visits Date Type Provider Dept  02/03/22 Office Visit Gillis Santa, MD [facility]  Showing today's visits and meeting all other requirements Future Appointments No visits were found meeting these conditions. Showing future appointments within next 90 days and meeting all other requirements  I discussed the assessment and treatment plan with the patient. The patient was provided an opportunity to ask questions and all were answered. The patient agreed with the plan and demonstrated an understanding of the instructions.  Patient advised to call back or seek an in-person evaluation if the  symptoms or condition worsens.  Duration of encounter: 69mnutes.  Total time on encounter, as per AMA guidelines included both the face-to-face and non-face-to-face time personally spent by the physician and/or other qualified health care professional(s) on the day of the encounter (includes time in activities that require the physician or other qualified health care professional and does not include time in activities normally performed by clinical staff). Physician's time may include the following activities when performed: preparing to see the patient (eg, review of tests, pre-charting review of records) obtaining and/or reviewing separately obtained history performing a medically appropriate examination and/or evaluation counseling and educating the patient/family/caregiver ordering medications, tests, or procedures referring and communicating with other health care professionals (when not separately reported) documenting clinical information in the electronic or other health record independently interpreting results (not separately reported) and communicating results to the patient/ family/caregiver care coordination (not separately reported)  Note by: BGillis Santa MD Date: 02/03/2022; Time: 9:33 AM

## 2022-02-07 ENCOUNTER — Telehealth: Payer: Self-pay | Admitting: Student in an Organized Health Care Education/Training Program

## 2022-02-07 NOTE — Telephone Encounter (Signed)
PT stated that she is in a lot of pain and discomfort. PT stated that she is using a heat pad to help with the discomfort. PT wants to know if doctor Cherylann Ratel will seen her in a prescription, until she goes to get her MRI done. Please give patient a call. Thanks

## 2022-02-07 NOTE — Telephone Encounter (Signed)
Attempted to call patient, message left. 

## 2022-02-07 NOTE — Telephone Encounter (Signed)
Spoke with patient, she is asking for a prescription for pain meds. I advised her that no prescriptions are written outside of appt.

## 2022-02-10 ENCOUNTER — Ambulatory Visit
Admission: RE | Admit: 2022-02-10 | Discharge: 2022-02-10 | Disposition: A | Discharge status: Home / Self Care | Payer: Medicare Other | Source: Ambulatory Visit | Attending: Student in an Organized Health Care Education/Training Program | Admitting: Student in an Organized Health Care Education/Training Program

## 2022-02-10 DIAGNOSIS — Z9889 Other specified postprocedural states: Secondary | ICD-10-CM | POA: Diagnosis present

## 2022-02-10 DIAGNOSIS — M47816 Spondylosis without myelopathy or radiculopathy, lumbar region: Secondary | ICD-10-CM | POA: Insufficient documentation

## 2022-02-10 DIAGNOSIS — M5136 Other intervertebral disc degeneration, lumbar region: Secondary | ICD-10-CM | POA: Diagnosis present

## 2022-02-10 DIAGNOSIS — G894 Chronic pain syndrome: Secondary | ICD-10-CM | POA: Insufficient documentation

## 2022-02-14 ENCOUNTER — Telehealth: Payer: Self-pay

## 2022-02-14 NOTE — Telephone Encounter (Signed)
I have sent a message to Dr. Cherylann Ratel. I have no idea what this is about.

## 2022-02-14 NOTE — Telephone Encounter (Addendum)
She called and said someone from our office called her and said we needed to schedule her for next Wednesday at 11 am. I didn't see any kind of phone message and I thought it was unlikely to schedule her an eval in the middle of procedure day. Does anyone know anything about this phone call? Please advise.

## 2022-02-16 ENCOUNTER — Ambulatory Visit
Payer: Medicare Other | Attending: Student in an Organized Health Care Education/Training Program | Admitting: Student in an Organized Health Care Education/Training Program

## 2022-02-16 ENCOUNTER — Encounter: Payer: Self-pay | Admitting: Student in an Organized Health Care Education/Training Program

## 2022-02-16 VITALS — BP 167/69 | HR 56 | Temp 97.2°F | Resp 16 | Ht 60.0 in | Wt 185.0 lb

## 2022-02-16 DIAGNOSIS — M47816 Spondylosis without myelopathy or radiculopathy, lumbar region: Secondary | ICD-10-CM | POA: Insufficient documentation

## 2022-02-16 DIAGNOSIS — G894 Chronic pain syndrome: Secondary | ICD-10-CM | POA: Insufficient documentation

## 2022-02-16 DIAGNOSIS — M5136 Other intervertebral disc degeneration, lumbar region: Secondary | ICD-10-CM | POA: Diagnosis present

## 2022-02-16 NOTE — Progress Notes (Signed)
Safety precautions to be maintained throughout the outpatient stay will include: orient to surroundings, keep bed in low position, maintain call bell within reach at all times, provide assistance with transfer out of bed and ambulation.  

## 2022-02-16 NOTE — Patient Instructions (Signed)
GENERAL RISKS AND COMPLICATIONS  What are the risk, side effects and possible complications? Generally speaking, most procedures are safe.  However, with any procedure there are risks, side effects, and the possibility of complications.  The risks and complications are dependent upon the sites that are lesioned, or the type of nerve block to be performed.  The closer the procedure is to the spine, the more serious the risks are.  Great care is taken when placing the radio frequency needles, block needles or lesioning probes, but sometimes complications can occur. Infection: Any time there is an injection through the skin, there is a risk of infection.  This is why sterile conditions are used for these blocks.  There are four possible types of infection. Localized skin infection. Central Nervous System Infection-This can be in the form of Meningitis, which can be deadly. Epidural Infections-This can be in the form of an epidural abscess, which can cause pressure inside of the spine, causing compression of the spinal cord with subsequent paralysis. This would require an emergency surgery to decompress, and there are no guarantees that the patient would recover from the paralysis. Discitis-This is an infection of the intervertebral discs.  It occurs in about 1% of discography procedures.  It is difficult to treat and it may lead to surgery.        2. Pain: the needles have to go through skin and soft tissues, will cause soreness.       3. Damage to internal structures:  The nerves to be lesioned may be near blood vessels or    other nerves which can be potentially damaged.       4. Bleeding: Bleeding is more common if the patient is taking blood thinners such as  aspirin, Coumadin, Ticiid, Plavix, etc., or if he/she have some genetic predisposition  such as hemophilia. Bleeding into the spinal canal can cause compression of the spinal  cord with subsequent paralysis.  This would require an emergency  surgery to  decompress and there are no guarantees that the patient would recover from the  paralysis.       5. Pneumothorax:  Puncturing of a lung is a possibility, every time a needle is introduced in  the area of the chest or upper back.  Pneumothorax refers to free air around the  collapsed lung(s), inside of the thoracic cavity (chest cavity).  Another two possible  complications related to a similar event would include: Hemothorax and Chylothorax.   These are variations of the Pneumothorax, where instead of air around the collapsed  lung(s), you may have blood or chyle, respectively.       6. Spinal headaches: They may occur with any procedures in the area of the spine.       7. Persistent CSF (Cerebro-Spinal Fluid) leakage: This is a rare problem, but may occur  with prolonged intrathecal or epidural catheters either due to the formation of a fistulous  track or a dural tear.       8. Nerve damage: By working so close to the spinal cord, there is always a possibility of  nerve damage, which could be as serious as a permanent spinal cord injury with  paralysis.       9. Death:  Although rare, severe deadly allergic reactions known as "Anaphylactic  reaction" can occur to any of the medications used.      10. Worsening of the symptoms:  We can always make thing worse.  What are the chances   of something like this happening? Chances of any of this occuring are extremely low.  By statistics, you have more of a chance of getting killed in a motor vehicle accident: while driving to the hospital than any of the above occurring .  Nevertheless, you should be aware that they are possibilities.  In general, it is similar to taking a shower.  Everybody knows that you can slip, hit your head and get killed.  Does that mean that you should not shower again?  Nevertheless always keep in mind that statistics do not mean anything if you happen to be on the wrong side of them.  Even if a procedure has a 1 (one) in a  1,000,000 (million) chance of going wrong, it you happen to be that one..Also, keep in mind that by statistics, you have more of a chance of having something go wrong when taking medications.  Who should not have this procedure? If you are on a blood thinning medication (e.g. Coumadin, Plavix, see list of "Blood Thinners"), or if you have an active infection going on, you should not have the procedure.  If you are taking any blood thinners, please inform your physician.  How should I prepare for this procedure? Do not eat or drink anything at least six hours prior to the procedure. Bring a driver with you .  It cannot be a taxi. Come accompanied by an adult that can drive you back, and that is strong enough to help you if your legs get weak or numb from the local anesthetic. Take all of your medicines the morning of the procedure with just enough water to swallow them. If you have diabetes, make sure that you are scheduled to have your procedure done first thing in the morning, whenever possible. If you have diabetes, take only half of your insulin dose and notify our nurse that you have done so as soon as you arrive at the clinic. If you are diabetic, but only take blood sugar pills (oral hypoglycemic), then do not take them on the morning of your procedure.  You may take them after you have had the procedure. Do not take aspirin or any aspirin-containing medications, at least eleven (11) days prior to the procedure.  They may prolong bleeding. Wear loose fitting clothing that may be easy to take off and that you would not mind if it got stained with Betadine or blood. Do not wear any jewelry or perfume Remove any nail coloring.  It will interfere with some of our monitoring equipment.  NOTE: Remember that this is not meant to be interpreted as a complete list of all possible complications.  Unforeseen problems may occur.  BLOOD THINNERS The following drugs contain aspirin or other products,  which can cause increased bleeding during surgery and should not be taken for 2 weeks prior to and 1 week after surgery.  If you should need take something for relief of minor pain, you may take acetaminophen which is found in Tylenol,m Datril, Anacin-3 and Panadol. It is not blood thinner. The products listed below are.  Do not take any of the products listed below in addition to any listed on your instruction sheet.  A.P.C or A.P.C with Codeine Codeine Phosphate Capsules #3 Ibuprofen Ridaura  ABC compound Congesprin Imuran rimadil  Advil Cope Indocin Robaxisal  Alka-Seltzer Effervescent Pain Reliever and Antacid Coricidin or Coricidin-D  Indomethacin Rufen  Alka-Seltzer plus Cold Medicine Cosprin Ketoprofen S-A-C Tablets  Anacin Analgesic Tablets or Capsules Coumadin   Korlgesic Salflex  Anacin Extra Strength Analgesic tablets or capsules CP-2 Tablets Lanoril Salicylate  Anaprox Cuprimine Capsules Levenox Salocol  Anexsia-D Dalteparin Magan Salsalate  Anodynos Darvon compound Magnesium Salicylate Sine-off  Ansaid Dasin Capsules Magsal Sodium Salicylate  Anturane Depen Capsules Marnal Soma  APF Arthritis pain formula Dewitt's Pills Measurin Stanback  Argesic Dia-Gesic Meclofenamic Sulfinpyrazone  Arthritis Bayer Timed Release Aspirin Diclofenac Meclomen Sulindac  Arthritis pain formula Anacin Dicumarol Medipren Supac  Analgesic (Safety coated) Arthralgen Diffunasal Mefanamic Suprofen  Arthritis Strength Bufferin Dihydrocodeine Mepro Compound Suprol  Arthropan liquid Dopirydamole Methcarbomol with Aspirin Synalgos  ASA tablets/Enseals Disalcid Micrainin Tagament  Ascriptin Doan's Midol Talwin  Ascriptin A/D Dolene Mobidin Tanderil  Ascriptin Extra Strength Dolobid Moblgesic Ticlid  Ascriptin with Codeine Doloprin or Doloprin with Codeine Momentum Tolectin  Asperbuf Duoprin Mono-gesic Trendar  Aspergum Duradyne Motrin or Motrin IB Triminicin  Aspirin plain, buffered or enteric coated  Durasal Myochrisine Trigesic  Aspirin Suppositories Easprin Nalfon Trillsate  Aspirin with Codeine Ecotrin Regular or Extra Strength Naprosyn Uracel  Atromid-S Efficin Naproxen Ursinus  Auranofin Capsules Elmiron Neocylate Vanquish  Axotal Emagrin Norgesic Verin  Azathioprine Empirin or Empirin with Codeine Normiflo Vitamin E  Azolid Emprazil Nuprin Voltaren  Bayer Aspirin plain, buffered or children's or timed BC Tablets or powders Encaprin Orgaran Warfarin Sodium  Buff-a-Comp Enoxaparin Orudis Zorpin  Buff-a-Comp with Codeine Equegesic Os-Cal-Gesic   Buffaprin Excedrin plain, buffered or Extra Strength Oxalid   Bufferin Arthritis Strength Feldene Oxphenbutazone   Bufferin plain or Extra Strength Feldene Capsules Oxycodone with Aspirin   Bufferin with Codeine Fenoprofen Fenoprofen Pabalate or Pabalate-SF   Buffets II Flogesic Panagesic   Buffinol plain or Extra Strength Florinal or Florinal with Codeine Panwarfarin   Buf-Tabs Flurbiprofen Penicillamine   Butalbital Compound Four-way cold tablets Penicillin   Butazolidin Fragmin Pepto-Bismol   Carbenicillin Geminisyn Percodan   Carna Arthritis Reliever Geopen Persantine   Carprofen Gold's salt Persistin   Chloramphenicol Goody's Phenylbutazone   Chloromycetin Haltrain Piroxlcam   Clmetidine heparin Plaquenil   Cllnoril Hyco-pap Ponstel   Clofibrate Hydroxy chloroquine Propoxyphen         Before stopping any of these medications, be sure to consult the physician who ordered them.  Some, such as Coumadin (Warfarin) are ordered to prevent or treat serious conditions such as "deep thrombosis", "pumonary embolisms", and other heart problems.  The amount of time that you may need off of the medication may also vary with the medication and the reason for which you were taking it.  If you are taking any of these medications, please make sure you notify your pain physician before you undergo any procedures.         Facet Blocks Patient  Information  Description: The facets are joints in the spine between the vertebrae.  Like any joints in the body, facets can become irritated and painful.  Arthritis can also effect the facets.  By injecting steroids and local anesthetic in and around these joints, we can temporarily block the nerve supply to them.  Steroids act directly on irritated nerves and tissues to reduce selling and inflammation which often leads to decreased pain.  Facet blocks may be done anywhere along the spine from the neck to the low back depending upon the location of your pain.   After numbing the skin with local anesthetic (like Novocaine), a small needle is passed onto the facet joints under x-ray guidance.  You may experience a sensation of pressure while this is being done.  The   entire block usually lasts about 15-25 minutes.   Conditions which may be treated by facet blocks:  Low back/buttock pain Neck/shoulder pain Certain types of headaches  Preparation for the injection:  Do not eat any solid food or dairy products within 8 hours of your appointment. You may drink clear liquid up to 3 hours before appointment.  Clear liquids include water, black coffee, juice or soda.  No milk or cream please. You may take your regular medication, including pain medications, with a sip of water before your appointment.  Diabetics should hold regular insulin (if taken separately) and take 1/2 normal NPH dose the morning of the procedure.  Carry some sugar containing items with you to your appointment. A driver must accompany you and be prepared to drive you home after your procedure. Bring all your current medications with you. An IV may be inserted and sedation may be given at the discretion of the physician. A blood pressure cuff, EKG and other monitors will often be applied during the procedure.  Some patients may need to have extra oxygen administered for a short period. You will be asked to provide medical information,  including your allergies and medications, prior to the procedure.  We must know immediately if you are taking blood thinners (like Coumadin/Warfarin) or if you are allergic to IV iodine contrast (dye).  We must know if you could possible be pregnant.  Possible side-effects:  Bleeding from needle site Infection (rare, may require surgery) Nerve injury (rare) Numbness & tingling (temporary) Difficulty urinating (rare, temporary) Spinal headache (a headache worse with upright posture) Light-headedness (temporary) Pain at injection site (serveral days) Decreased blood pressure (rare, temporary) Weakness in arm/leg (temporary) Pressure sensation in back/neck (temporary)   Call if you experience:  Fever/chills associated with headache or increased back/neck pain Headache worsened by an upright position New onset, weakness or numbness of an extremity below the injection site Hives or difficulty breathing (go to the emergency room) Inflammation or drainage at the injection site(s) Severe back/neck pain greater than usual New symptoms which are concerning to you  Please note:  Although the local anesthetic injected can often make your back or neck feel good for several hours after the injection, the pain will likely return. It takes 3-7 days for steroids to work.  You may not notice any pain relief for at least one week.  If effective, we will often do a series of 2-3 injections spaced 3-6 weeks apart to maximally decrease your pain.  After the initial series, you may be a candidate for a more permanent nerve block of the facets.  If you have any questions, please call [phone] [facility] 

## 2022-02-16 NOTE — Progress Notes (Signed)
PROVIDER NOTE: Information contained herein reflects review and annotations entered in association with encounter. Interpretation of such information and data should be left to medically-trained personnel. Information provided to patient can be located elsewhere in the medical record under "Patient Instructions". Document created using STT-dictation technology, any transcriptional errors that may result from process are unintentional.    Patient: Jean Stout  Service Category: E/M  Provider: Gillis Santa, MD  DOB: 01-Nov-1940  DOS: 02/16/2022  Referring Provider: Gladstone Lighter, MD  MRN: [id number]  Specialty: Interventional Pain Management  PCP: Gladstone Lighter, MD  Type: Established Patient  Setting: Ambulatory outpatient    Location: Office  Delivery: Face-to-face     HPI  Jean Stout, a 81 y.o. year old female, is here today because of her Lumbar facet arthropathy [M47.816]. Jean Stout's primary complain today is Back Pain (lower) Last encounter: My last encounter with her was on 02/07/2022. Pertinent problems: Jean Stout has Type 2 diabetes mellitus without complication, without long-term current use of insulin ([place]); Osteoarthritis of multiple joints; Primary osteoarthritis of left knee; Localized primary osteoarthritis of shoulder regions, bilateral; Right wrist pain; Chronic pain syndrome; Polyarthralgia; History of bilateral hip replacements; MDD (major depressive disorder), recurrent episode, moderate ([place]); GAD (generalized anxiety disorder); and Panic attacks on their pertinent problem list. Pain Assessment: Severity of Chronic pain is reported as a 5 /10. Location: Back Lower/denies. Onset: More than a month ago. Quality: Dull. Timing: Constant. Modifying factor(s): flexing spine, sitting. Vitals:  height is 5' (1.524 m) and weight is 185 lb (83.9 kg). Her temporal temperature is 97.2 F (36.2 C) (abnormal). Her blood pressure is 167/69 (abnormal) and her pulse is 56  (abnormal). Her respiration is 16 and oxygen saturation is 98%.   Reason for encounter: follow-up evaluation to review lumbar MRI.  ROS  Constitutional: Denies any fever or chills Gastrointestinal: No reported hemesis, hematochezia, vomiting, or acute GI distress Musculoskeletal: Denies any acute onset joint swelling, redness, loss of ROM, or weakness Neurological: No reported episodes of acute onset apraxia, aphasia, dysarthria, agnosia, amnesia, paralysis, loss of coordination, or loss of consciousness  Medication Review  DULoxetine, Magnesium, Semaglutide, amLODipine, atorvastatin, cholecalciferol, diazepam, diclofenac Sodium, docusate sodium, famotidine, glucosamine-chondroitin, hydrALAZINE, lisinopril, metFORMIN, methocarbamol, multivitamin with minerals, and omeprazole  History Review  Allergy: Jean Stout is allergic to gadolinium derivatives, iodinated contrast media, lotrel [amlodipine besy-benazepril hcl], and bextra [valdecoxib]. Drug: Jean Stout  reports no history of drug use. Alcohol:  reports current alcohol use of about 1.0 standard drink of alcohol per week. Tobacco:  reports that she has never smoked. She has never used smokeless tobacco. Social: Jean Stout  reports that she has never smoked. She has never used smokeless tobacco. She reports current alcohol use of about 1.0 standard drink of alcohol per week. She reports that she does not use drugs. Medical:  has a past medical history of Anxiety and depression, Aortic stenosis, Arthritis, Cardiac murmur, CKD (chronic kidney disease), stage III ([place]), Coronary artery disease, GERD (gastroesophageal reflux disease), History of 2019 novel coronavirus disease (COVID-19) (12/29/2020), History of MI (myocardial infarction) (2010), HLD (hyperlipidemia), HTN (hypertension), IDA (iron deficiency anemia), Mild mitral regurgitation, Myocardial infarction ([place]) (2010), OSA on CPAP, Osteopenia, PONV (postoperative nausea and vomiting), T2DM  (type 2 diabetes mellitus) ([place]), and Vaginal prolapse. Surgical: Jean Stout  has a past surgical history that includes Carotid stent; Joint replacement (Bilateral); Coronary angioplasty with stent (N/A, 2010); Eye surgery; Carpal tunnel release (Left, 1983); Cyst excision (1984); Kyphoplasty (N/A, 09/08/2020); Total knee  arthroplasty (Left, 01/12/2021); Total knee arthroplasty (Right, 04/27/2021); Total hip arthroplasty (Right, 03/2005); Total hip arthroplasty (Left, 05/2005); and Knee Closed Reduction (Right, 05/27/2021). Family: family history includes Anxiety disorder in her mother; Bipolar disorder in her sister; Cancer in her daughter; Heart attack in her father and son; Hyperlipidemia in her son; Hypertension in her mother and son; Sleep apnea in her son; Sudden Cardiac Death (age of onset: 88) in her father; Sudden Cardiac Death (age of onset: 62) in her brother.  Laboratory Chemistry Profile   Renal Lab Results  Component Value Date   BUN 18 04/28/2021   CREATININE 0.89 04/28/2021   GFRNONAA >60 04/28/2021    Hepatic Lab Results  Component Value Date   AST 27 04/15/2021   ALT 20 04/15/2021   ALBUMIN 4.1 04/15/2021   ALKPHOS 87 04/15/2021    Electrolytes Lab Results  Component Value Date   NA 134 (L) 04/28/2021   K 4.2 04/28/2021   CL 104 04/28/2021   CALCIUM 8.8 (L) 04/28/2021    Bone No results found for: "VD25OH", "VD125OH2TOT", "WR6045WU9", "WJ1914NW2", "25OHVITD1", "25OHVITD2", "25OHVITD3", "TESTOFREE", "TESTOSTERONE"  Inflammation (CRP: Acute Phase) (ESR: Chronic Phase) No results found for: "CRP", "ESRSEDRATE", "LATICACIDVEN"       Note: Above Lab results reviewed.  Recent Imaging Review  MR LUMBAR SPINE WO CONTRAST CLINICAL DATA:  Low back pain for over 6 weeks. Sacral pain for 2 months.  EXAM: MRI LUMBAR SPINE WITHOUT CONTRAST  TECHNIQUE: Multiplanar, multisequence MR imaging of the lumbar spine was performed. No intravenous contrast was  administered.  COMPARISON:  CT lumbar spine 08/23/2020  FINDINGS: Segmentation:  Standard.  Alignment: Mild grade 1 anterolisthesis of L3 on L4, L4 on L5 and L5 on S1 secondary to facet disease.  Vertebrae: No acute fracture, evidence of discitis, or aggressive bone lesion. Old L2 vertebral body compression fracture with prior augmentation and methylmethacrylate within the L2 vertebral body.  Conus medullaris and cauda equina: Conus extends to the L1 level. Conus and cauda equina appear normal.  Paraspinal and other soft tissues: No acute paraspinal abnormality.  Disc levels:  Disc spaces: Degenerative disease with disc height loss at T10-11, T11-12, T12-L1, L1-2. Disc desiccation throughout the remainder of the lumbar spine. Mild disc height loss at L5-S1.  T11-12: Mild broad-based disc bulge. No foraminal or central canal stenosis.  T12-L1: Broad-based disc bulge with a broad left paracentral disc protrusion. Mild spinal stenosis. Mild bilateral facet arthropathy. No foraminal stenosis.  L1-L2: Broad-based disc bulge. Mild bilateral facet arthropathy. Severe spinal stenosis. Moderate-severe bilateral foraminal stenosis.  L2-L3: Mild broad-based disc bulge. Mild bilateral facet arthropathy. Mild spinal stenosis. Moderate right and mild left foraminal stenosis.  L3-L4: Mild broad-based disc bulge. Moderate left and mild right facet arthropathy. Severe spinal stenosis. Mild right and moderate left foraminal stenosis.  L4-L5: Broad-based disc bulge. Moderate bilateral facet arthropathy. Severe spinal stenosis. Severe left and moderate-severe right foraminal stenosis.  L5-S1: Broad-based disc bulge with a broad central disc protrusion. Severe bilateral facet arthropathy. No foraminal stenosis. No spinal stenosis.  IMPRESSION: 1. Diffuse lumbar spine spondylosis as described above. 2. No acute osseous injury of the lumbar spine. 3. Chronic L2 vertebral body  compression fracture with prior augmentation and without residual marrow edema.  Electronically Signed   By: Kathreen Devoid M.D.   On: 02/12/2022 07:14 Note: Reviewed        Physical Exam  General appearance: Well nourished, well developed, and well hydrated. In no apparent acute distress Mental status: Alert,  oriented x 3 (person, place, & time)       Respiratory: No evidence of acute respiratory distress Eyes: PERLA Vitals: BP (!) 167/69   Pulse (!) 56   Temp (!) 97.2 F (36.2 C) (Temporal)   Resp 16   Ht 5' (1.524 m)   Wt 185 lb (83.9 kg)   SpO2 98%   BMI 36.13 kg/m  BMI: Estimated body mass index is 36.13 kg/m as calculated from the following:   Height as of this encounter: 5' (1.524 m).   Weight as of this encounter: 185 lb (83.9 kg). Ideal: Ideal body weight: 45.5 kg (100 lb 4.9 oz) Adjusted ideal body weight: 60.9 kg (134 lb 3 oz)  Lumbar Spine Area Exam  Skin & Axial Inspection: No masses, redness, or swelling Alignment: Symmetrical Functional ROM: Pain restricted ROM affecting both sides Stability: No instability detected Muscle Tone/Strength: Functionally intact. No obvious neuro-muscular anomalies detected. Sensory (Neurological): Musculoskeletal pain pattern, facet mediated Palpation: No palpable anomalies       Provocative Tests: Hyperextension/rotation test: (+) bilaterally for facet joint pain. Lumbar quadrant test (Kemp's test): (+) bilaterally for facet joint pain. Lateral bending test: (+) due to pain.  Gait & Posture Assessment  Ambulation: Unassisted Gait: Relatively normal for age and body habitus Posture: WNL  Lower Extremity Exam    Side: Right lower extremity  Side: Left lower extremity  Stability: No instability observed          Stability: No instability observed          Skin & Extremity Inspection: Evidence of prior arthroplastic surgery  Skin & Extremity Inspection: Evidence of prior arthroplastic surgery  Functional ROM: Unrestricted ROM                   Functional ROM: Unrestricted ROM                  Muscle Tone/Strength: Functionally intact. No obvious neuro-muscular anomalies detected.  Muscle Tone/Strength: Functionally intact. No obvious neuro-muscular anomalies detected.  Sensory (Neurological): Neurogenic pain pattern        Sensory (Neurological): Neurogenic pain pattern        DTR: Patellar: deferred today Achilles: deferred today Plantar: deferred today  DTR: Patellar: deferred today Achilles: deferred today Plantar: deferred today  Palpation: No palpable anomalies  Palpation: No palpable anomalies    Assessment   Diagnosis Status  1. Lumbar facet arthropathy   2. Lumbar spondylosis   3. Lumbar degenerative disc disease   4. Chronic pain syndrome    Worsening Worsening Worsening   Plan of Care   Amor Hyle has a history of greater than 3 months of moderate to severe pain which is resulted in functional impairment.  The patient has tried various conservative therapeutic options such as NSAIDs, Tylenol, muscle relaxants, physical therapy which was inadequately effective.  Patient's pain is predominantly axial with physical exam and L-MRI findings indicative of severe facet arthropathy.  Lumbar facet medial branch nerve blocks were discussed with the patient.  Risks and benefits were reviewed.  Patient would like to proceed with bilateral L3, L4, L5 medial branch nerve block.    Orders:  Orders Placed This Encounter  Procedures   LUMBAR FACET(MEDIAL BRANCH NERVE BLOCK) MBNB    Standing Status:   Future    Standing Expiration Date:   05/19/2022    Scheduling Instructions:     Procedure: Lumbar facet block (AKA.: Lumbosacral medial branch nerve block)     Side: Bilateral  Level: L3-4 & L5-S1 Facets ( L3, L4, L5,  Medial Branch Nerves)     Sedation: LOCAL     Timeframe: ASAA    Order Specific Question:   Where will this procedure be performed?    Answer:   [facility]   Follow-up  plan:   Return in about 5 weeks (around 03/23/2022) for B/L L3, 4, 5 MBNB, in clinic [facility].     Left intra-articular knee steroid injection on 05/27/2019, bilateral knee steroid injection 08/21/2019, 11/13/19, 02/05/20.  Has previously had 5 series of viscosupplementation at outside clinic.  States it was not effective.  Status post genicular nerve block which was not effective.  Can consider sprint PNS in future       Recent Visits Date Type Provider Dept  02/03/22 Office Visit Gillis Santa, MD [facility]  Showing recent visits within past 90 days and meeting all other requirements Today's Visits Date Type Provider Dept  02/16/22 Office Visit Gillis Santa, MD [facility]  Showing today's visits and meeting all other requirements Future Appointments Date Type Provider Dept  03/30/22 Appointment Gillis Santa, MD [facility]  Showing future appointments within next 90 days and meeting all other requirements  I discussed the assessment and treatment plan with the patient. The patient was provided an opportunity to ask questions and all were answered. The patient agreed with the plan and demonstrated an understanding of the instructions.  Patient advised to call back or seek an in-person evaluation if the symptoms or condition worsens.  Duration of encounter: 30 minutes.  Total time on encounter, as per AMA guidelines included both the face-to-face and non-face-to-face time personally spent by the physician and/or other qualified health care professional(s) on the day of the encounter (includes time in activities that require the physician or other qualified health care professional and does not include time in activities normally performed by clinical staff). Physician's time may include the following activities when performed: preparing to see the patient (eg, review of tests, pre-charting review of records) obtaining and/or reviewing separately obtained  history performing a medically appropriate examination and/or evaluation counseling and educating the patient/family/caregiver ordering medications, tests, or procedures referring and communicating with other health care professionals (when not separately reported) documenting clinical information in the electronic or other health record independently interpreting results (not separately reported) and communicating results to the patient/ family/caregiver care coordination (not separately reported)  Note by: Gillis Santa, MD Date: 02/16/2022; Time: 3:15 PM

## 2022-03-08 ENCOUNTER — Telehealth: Payer: Self-pay

## 2022-03-08 NOTE — Telephone Encounter (Signed)
Patient called back and spoke to [facility] and wanted it documented that she is dissatisfied with the code, and should not be charged 200 + dollars for an evaluation.

## 2022-03-08 NOTE — Telephone Encounter (Signed)
She is wanting to know what billing code Dr. Cherylann Ratel used for her 02/03/22 appt. It looks like an eval, but I dont know how to find out what code he used.Please advise.

## 2022-03-23 ENCOUNTER — Telehealth: Payer: Self-pay | Admitting: Student in an Organized Health Care Education/Training Program

## 2022-03-23 NOTE — Telephone Encounter (Signed)
Attempted to call patient, message left. 

## 2022-03-23 NOTE — Telephone Encounter (Signed)
Patient is appealing a charge from her last visit. She says she was in acute pain and her visit should not have been marked as routine. She wants to speak to a nurse that enters charges.

## 2022-03-23 NOTE — Telephone Encounter (Signed)
Explained to patient that we charge all established patients the same. She wants to know if there is anything else that we can do to reduce that charge.

## 2022-03-30 ENCOUNTER — Ambulatory Visit: Payer: Medicare Other | Admitting: Student in an Organized Health Care Education/Training Program

## 2022-06-15 ENCOUNTER — Ambulatory Visit
Admission: RE | Admit: 2022-06-15 | Discharge: 2022-06-15 | Disposition: A | Discharge status: Home / Self Care | Payer: Medicare Other | Source: Ambulatory Visit | Attending: Physician Assistant | Admitting: Physician Assistant

## 2022-06-15 ENCOUNTER — Other Ambulatory Visit: Payer: Self-pay | Admitting: Physician Assistant

## 2022-06-15 DIAGNOSIS — I87303 Chronic venous hypertension (idiopathic) without complications of bilateral lower extremity: Secondary | ICD-10-CM | POA: Insufficient documentation

## 2022-12-06 ENCOUNTER — Telehealth: Payer: Self-pay

## 2022-12-06 ENCOUNTER — Ambulatory Visit: Payer: Medicare Other | Admitting: Psychiatry

## 2022-12-06 ENCOUNTER — Encounter: Payer: Self-pay | Admitting: Psychiatry

## 2022-12-06 VITALS — BP 190/76 | HR 53 | Temp 98.4°F | Ht 60.0 in | Wt 163.0 lb

## 2022-12-06 DIAGNOSIS — Z79899 Other long term (current) drug therapy: Secondary | ICD-10-CM | POA: Diagnosis not present

## 2022-12-06 DIAGNOSIS — F411 Generalized anxiety disorder: Secondary | ICD-10-CM | POA: Diagnosis not present

## 2022-12-06 DIAGNOSIS — F331 Major depressive disorder, recurrent, moderate: Secondary | ICD-10-CM | POA: Diagnosis not present

## 2022-12-06 MED ORDER — VORTIOXETINE HBR 5 MG PO TABS: 5.0000 mg | ORAL_TABLET | Freq: Every day | ORAL | 1 refills | Status: DC

## 2022-12-06 NOTE — Patient Instructions (Signed)
Vortioxetine Tablets What is this medication? Vortioxetine (vor tee [employer] teen) treats depression. It increases the amount of serotonin in the brain, a substance that helps regulate mood. This medicine may be used for other purposes; ask your health care provider or pharmacist if you have questions. COMMON BRAND NAME(S): BRINTELLIX, Trintellix What should I tell my care team before I take this medication? They need to know if you have any of these conditions: Bipolar disorder or a family history of bipolar disorder Bleeding disorder Glaucoma Liver disease Low levels of sodium in the blood Seizures Suicidal thoughts, plans, or attempt by you or a family member Take medications that treat or prevent blood clots Taken an MAOI, such as Marplan, Nardil, or Parnate in the last 14 days An unusual or allergic reaction to vortioxetine, other medications, foods, dyes, or preservatives Pregnant or trying to get pregnant Breastfeeding How should I use this medication? Take this medication by mouth with water. Take it as directed on the prescription label at the same time every day. You can take it with or without food. If it upsets your stomach, take it with food. Keep taking it unless your care team tells you to stop. A special MedGuide will be given to you by the pharmacist with each prescription and refill. Be sure to read this information carefully each time. Talk to your care team about the use of this medication in children. Special care may be needed. Overdosage: If you think you have taken too much of this medicine contact a poison control center or emergency room at once. NOTE: This medicine is only for you. Do not share this medicine with others. What if I miss a dose? If you miss a dose, take it as soon as you can. If it is almost time for your next dose, take only that dose. Do not take double or extra doses. What may interact with this medication? Do not take this medication with any of  the following: Linezolid MAOIs, such as Marplan, Nardil, and Parnate Methylene blue (injected into a vein) This medication may also interact with the following: Aspirin and aspirin-like medications Certain medications for depression, anxiety, or other mental health conditions Certain medications for migraines, such as sumatriptan Certain medications for seizures Diuretics Lithium Medications that treat or prevent blood clots, such as warfarin NSAIDs, medications for pain and inflammation, such as ibuprofen or naproxen Opioids Quinidine St. John's wort Stimulant medications for ADHD, weight loss, or staying awake Tryptophan This list may not describe all possible interactions. Give your health care provider a list of all the medicines, herbs, non-prescription drugs, or dietary supplements you use. Also tell them if you smoke, drink alcohol, or use illegal drugs. Some items may interact with your medicine. What should I watch for while using this medication? Visit your care team for regular checks on your progress. Tell your care team if your symptoms do not start to get better or if they get worse. This medication may cause thoughts of suicide or depression. This includes sudden changes in mood, behaviors, or thoughts. These changes can happen at any time but are more common in the beginning of treatment or after a change in dose. Call your care team right away if you experience these thoughts or worsening depression. This medication may affect your coordination, reaction time, or judgment. Do not drive or operate machinery until you know how this medication affects you. Sit up or stand slowly to reduce the risk of dizzy or fainting spells.  Drinking alcohol with this medication can increase the risk of these side effects. What side effects may I notice from receiving this medication? Side effects that you should report to your care team as soon as possible: Allergic reactions--skin rash,  itching, hives, swelling of the face, lips, tongue, or throat Bleeding--bloody or black, tar-like stools, vomiting blood or brown material that looks like coffee grounds, red or dark brown urine, small red or purple spots on skin, unusual bruising or bleeding Irritability, confusion, fast or irregular heartbeat, muscle stiffness, twitching muscles, sweating, high fever, seizure, chills, vomiting, diarrhea, which may be signs of serotonin syndrome Low sodium level--muscle weakness, fatigue, dizziness, headache, confusion Sudden eye pain or change in vision such as blurry vision, seeing halos around lights, vision loss Thoughts of suicide or self-harm, worsening mood, feelings of depression Side effects that usually do not require medical attention (report to your care team if they continue or are bothersome): Change in sex drive or performance Constipation Nausea Vomiting This list may not describe all possible side effects. Call your doctor for medical advice about side effects. You may report side effects to FDA at 1-800-FDA-1088. Where should I keep my medication? Keep out of the reach of children and pets. Store at room temperature between 20 and 25 degrees C (68 and 77 degrees F). Throw away any unused medication after the expiration date. NOTE: This sheet is a summary. It may not cover all possible information. If you have questions about this medicine, talk to your doctor, pharmacist, or health care provider.  2024 Elsevier/Gold Standard (2022-06-22 00:00:00)

## 2022-12-06 NOTE — Progress Notes (Signed)
Psychiatric Initial Adult Assessment   Patient Identification: Jean Stout MRN:  [id number] Date of Evaluation:  12/06/2022 Referral Source: Dr.Radhika Mack [place] Chief Complaint:   Chief Complaint  Patient presents with   Establish Care   Depression   Anxiety   Medication Problem   Visit Diagnosis:    ICD-10-CM   1. MDD (major depressive disorder), recurrent episode, moderate (HCC)  F33.1 vortioxetine HBr (TRINTELLIX) 5 MG TABS tablet    Sodium    Platelet count    2. GAD (generalized anxiety disorder)  F41.1 vortioxetine HBr (TRINTELLIX) 5 MG TABS tablet    Sodium    Platelet count    3. High risk medication use  Z79.899 Sodium    Platelet count      History of Present Illness:  Jean Stout is a 82 year old old Caucasian female, widowed, lives in [place], has a history of MDD, GAD, hypertension, hyperlipidemia, coronary artery disease, diabetes mellitus, presented to reestablish care.  Patient's last visit with this provider was in August 2020.    Patient today returns reporting that she had relocated to [place] in September 2020.  However that did not work out for her so she came back to [place] and thereafter moved in with her son in [place].  She reports she did not like that and hence decided to come back to [place].  Patient reports she has a lot going on.  She reports her brother just recently passed away, he was 83 years old.  The funeral is in [place] in 2 weeks from now.  She is planning to attend that.  She reports her twin sister whom she was taking care of was moved to a nursing facility at [facility] recently.  She reports her sister has a lot going on health wise and she is unable to care for her herself.  That makes her feel guilty and depressed however she understands there is nothing much she can do about it.  Patient hence struggles with sadness, low energy, concentration problems, worrying about things in general, feeling nervous,  decreased appetite and so on.  This has been getting worse.  Patient reports her primary care provider recently tried Pristiq however she stopped it after a week since it did not help her.  She denies any side effects to the Pristiq.  Patient has a history of anxiety attacks however currently denies it at this time.  Patient has any manic or hypomanic symptoms.  Patient reports she has tried and failed multiple medications in the past however a few of these medications she may have only taken for a few weeks and decided to stop.  Patient reports sleep is overall okay on the melatonin at this time.  She currently denies any suicidality, homicidality or perceptual disturbances.  Patient is alert, oriented to person place time situation.  3 word memory immediate 3 out of 3, after 5 minutes 3 out of 3.  Patient was able to do calculation well, serial sevens.  Attention and focus seems to be good.     Associated Signs/Symptoms: Depression Symptoms:  depressed mood, anhedonia, feelings of worthlessness/guilt, difficulty concentrating, decreased appetite, (Hypo) Manic Symptoms:   Denies Anxiety Symptoms:  Excessive Worry, Psychotic Symptoms:   Denies PTSD Symptoms: Negative  Past Psychiatric History: Patient with previous diagnosis of MDD, GAD.  Patient used to be under the care of this provider here at [facility] , several years ago.  Patient denies suicide attempts.  Denies inpatient behavioral health admissions.  Previous Psychotropic Medications: Yes past trials of medications like Zoloft, Lexapro, Celexa, Cymbalta, mirtazapine, Prozac-tried several years ago, Wellbutrin.  Pristiq-tried it only for a week and stopped since she felt it was not working.  Substance Abuse History in the last 12 months:  No.  Consequences of Substance Abuse: Negative  Past Medical History:  Past Medical History:  Diagnosis Date   Anxiety and depression    Aortic stenosis    Arthritis    Cardiac murmur     a.) Grade I/VI medium pitched mid systolic blowing at lower LSB   CKD (chronic kidney disease), stage III (HCC)    Coronary artery disease    GERD (gastroesophageal reflux disease)    History of 2019 novel coronavirus disease (COVID-19) 12/29/2020   a.) asymptomatic; PCR (+) on 12/29/2020 with confirmatory (+) rapid PCR on 12/30/2020.   History of MI (myocardial infarction) 2010   HLD (hyperlipidemia)    HTN (hypertension)    IDA (iron deficiency anemia)    Mild mitral regurgitation    Myocardial infarction [facility]) 2010   a.) Tx'd at [facility]; PCI with stents x 2 (unknown type and location).   OSA on CPAP    a.) not complaint with prescribed nocturnal PAP therapy   Osteopenia    PONV (postoperative nausea and vomiting)    T2DM (type 2 diabetes mellitus) (HCC)    Vaginal prolapse     Past Surgical History:  Procedure Laterality Date   CAROTID STENT     CARPAL TUNNEL RELEASE Left 1983   CORONARY ANGIOPLASTY WITH STENT PLACEMENT N/A 2010   stents x 2 placed (unknown type and location); Location: [facility]   CYST EXCISION  1984   from Left side of neck   EYE SURGERY     JOINT REPLACEMENT Bilateral    hips & knees   KNEE CLOSED REDUCTION Right 05/27/2021   Procedure: CLOSED MANIPULATION KNEE;  Surgeon: Kennedy Bucker, MD;  Location: [facility];  Service: Orthopedics;  Laterality: Right;   KYPHOPLASTY N/A 09/08/2020   Procedure: L2  KYPHOPLASTY;  Surgeon: Kennedy Bucker, MD;  Location: [facility];  Service: Orthopedics;  Laterality: N/A;   TOTAL HIP ARTHROPLASTY Right 03/2005   TOTAL HIP ARTHROPLASTY Left 05/2005   TOTAL KNEE ARTHROPLASTY Left 01/12/2021   Procedure: TOTAL KNEE ARTHROPLASTY;  Surgeon: Kennedy Bucker, MD;  Location: [facility];  Service: Orthopedics;  Laterality: Left;   TOTAL KNEE ARTHROPLASTY Right 04/27/2021   Procedure: TOTAL KNEE ARTHROPLASTY;  Surgeon: Kennedy Bucker, MD;  Location: [facility];  Service: Orthopedics;  Laterality: Right;     Family Psychiatric History: As noted below.  Family History:  Family History  Problem Relation Age of Onset   Hypertension Mother    Anxiety disorder Mother    Heart attack Father    Sudden Cardiac Death Father 25   Bipolar disorder Sister    Sudden Cardiac Death Brother 68   Cancer Daughter        Breast   Hypertension Son    Hyperlipidemia Son    Heart attack Son    Sleep apnea Son     Social History:   Social History   Socioeconomic History   Marital status: Widowed    Spouse name: Not on file   Number of children: 3   Years of education: Not on file   Highest education level: Bachelor's degree (e.g., BA, AB, BS)  Occupational History   Not on file  Tobacco Use   Smoking status:  Never   Smokeless tobacco: Never  Vaping Use   Vaping status: Never Used  Substance and Sexual Activity   Alcohol use: Yes    Alcohol/week: 1.0 standard drink of alcohol    Types: 1 Glasses of wine per week    Comment: rarely   Drug use: Never   Sexual activity: Not Currently  Other Topics Concern   Not on file  Social History Narrative   Lives with sister   Social Determinants of Health   Financial Resource Strain: Low Risk  (10/27/2022)   Received from [facility]   Overall Financial Resource Strain (CARDIA)    Difficulty of Paying Living Expenses: Not hard at all  Food Insecurity: No Food Insecurity (10/27/2022)   [facility]   Hunger Vital Sign    Worried About Running Out of Food in the Last Year: Never true    Ran Out of Food in the Last Year: Never true  Transportation Needs: No Transportation Needs (10/27/2022)   Received from [facility] - Transportation    In the past 12 months, has lack of transportation kept you from medical appointments or from getting medications?: No    Lack of Transportation (Non-Medical): No  Physical Activity: Insufficiently Active (04/30/2018)   Exercise Vital Sign     Days of Exercise per Week: 5 days    Minutes of Exercise per Session: 20 min  Stress: Stress Concern Present (04/30/2018)   [employer] of Occupational Health - Occupational Stress Questionnaire    Feeling of Stress : Rather much  Social Connections: Unknown (04/30/2018)   Social Connection and Isolation Panel [NHANES]    Frequency of Communication with Friends and Family: Not on file    Frequency of Social Gatherings with Friends and Family: Not on file    Attends Religious Services: More than 4 times per year    Active Member of [employer] or Organizations: Yes    Attends Banker Meetings: Never    Marital Status: Widowed    Additional Social History: Patient is widowed.  She currently lives in [place].  She is a retired Charity fundraiser.  She has 2 sons and 1 daughter.  She has a twin sister.  Her brother recently passed away.  Patient is planning to relocate to [place] end of September. Allergies:   Allergies  Allergen Reactions   Gadolinium Derivatives Rash   Iodinated Contrast Media Itching   Lotrel [Amlodipine Besy-Benazepril Hcl] Itching   Bextra [Valdecoxib] Rash    Metabolic Disorder Labs: No results found for: "HGBA1C", "MPG" No results found for: "PROLACTIN" No results found for: "CHOL", "TRIG", "HDL", "CHOLHDL", "VLDL", "LDLCALC" No results found for: "TSH"  Therapeutic Level Labs: No results found for: "LITHIUM" No results found for: "CBMZ" No results found for: "VALPROATE"  Current Medications: Current Outpatient Medications  Medication Sig Dispense Refill   amLODipine (NORVASC) 5 MG tablet Take 5 mg by mouth daily.     atorvastatin (LIPITOR) 40 MG tablet Take 40 mg by mouth at bedtime.     cholecalciferol (VITAMIN D3) 25 MCG (1000 UNIT) tablet Take 1,000 Units by mouth daily.     diazepam (VALIUM) 5 MG tablet Take 1 tablet (5 mg total) by mouth 60 (sixty) minutes before procedure for 1 dose. 1 tab PO 60 min pre-MRI. 1 tablet 0   diclofenac Sodium (VOLTAREN)  1 % GEL Apply 1 application topically 3 (three) times daily as needed (pain).  famotidine (PEPCID) 40 MG tablet Take 40 mg by mouth at bedtime.     glucosamine-chondroitin 500-400 MG tablet Take 2 tablets by mouth daily.     hydrALAZINE (APRESOLINE) 50 MG tablet Take 50 mg by mouth 3 (three) times daily.     hydrOXYzine (ATARAX) 10 MG tablet Take by mouth.     lisinopril (PRINIVIL,ZESTRIL) 40 MG tablet Take 40 mg by mouth daily.      metFORMIN (GLUCOPHAGE-XR) 500 MG 24 hr tablet Take 500 mg by mouth daily with breakfast.      Multiple Vitamins-Minerals (MULTIVITAMIN WITH MINERALS) tablet Take 1 tablet by mouth daily.     omeprazole (PRILOSEC) 20 MG capsule Take 20 mg by mouth daily.     vortioxetine HBr (TRINTELLIX) 5 MG TABS tablet Take 1 tablet (5 mg total) by mouth daily. 30 tablet 1   Magnesium 250 MG TABS Take 250 mg by mouth daily. (Patient not taking: Reported on 12/06/2022)     No current facility-administered medications for this visit.    Musculoskeletal: Strength & Muscle Tone: within normal limits Gait & Station: normal Patient leans: N/A  Psychiatric Specialty Exam: Review of Systems  Psychiatric/Behavioral:  Positive for dysphoric mood. The patient is nervous/anxious.        Grief    Blood pressure (!) 190/76, pulse (!) 53, temperature 98.4 F (36.9 C), temperature source Skin, height 5' (1.524 m), weight 163 lb (73.9 kg).Body mass index is 31.83 kg/m.  General Appearance: Fairly Groomed  Eye Contact:  Fair  Speech:  Clear and Coherent  Volume:  Normal  Mood:  Anxious and Depressed, grief  Affect:  Tearful  Thought Process:  Goal Directed and Descriptions of Associations: Intact  Orientation:  Full (Time, Place, and Person)  Thought Content:  Logical  Suicidal Thoughts:  No  Homicidal Thoughts:  No  Memory:  Immediate;   Fair Recent;   Fair Remote;   Fair  Judgement:  Fair  Insight:  Fair  Psychomotor Activity:  Normal  Concentration:  Concentration: Fair  and Attention Span: Fair  Recall:  [employer] of Knowledge:Fair  Language: Fair  Akathisia:  No  Handed:  Right  AIMS (if indicated):  not done  Assets:  Communication Skills Desire for Improvement Housing Social Support  ADL's:  Intact  Cognition: WNL  Sleep:  Fair   Screenings: PHQ2-9    Flowsheet Row Office Visit from 02/03/2022 in [facility] Visit from 09/15/2020 in [facility] Visit from 05/19/2020 in [facility] Procedure visit from 05/06/2020 in [facility] Procedure visit from 02/05/2020 in [facility] Total Score 0 0 0 0 0      Flowsheet Row ED from 09/04/2021 in [facility] Emergency Department at [facility] Pre-Admission Testing 45 from 05/25/2021 in [facility] PRE ADMISSION TESTING Admission (Discharged) from 04/27/2021 in [facility] SURGE PERIOPERATIVE AREA  C-SSRS RISK CATEGORY No Risk No Risk No Risk       Assessment and Plan: Jean Stout is a 82 year old Caucasian female who is retired, lives in [place], has a history of multiple medical problems, worsening depression symptoms, multiple trials of antidepressants in the past, noncompliance with medications, currently will benefit from addition of an antidepressant to target her mood symptoms, plan as noted below. The  patient demonstrates the following risk factors for suicide: Chronic risk factors for suicide include: psychiatric disorder of MDD, GAD and medical illness Multiple . Acute risk factors for suicide include: loss (financial, interpersonal, professional). Protective factors for this patient include: positive social support, positive therapeutic  relationship, coping skills, hope for the future, and religious beliefs against suicide. Considering these factors, the overall suicide risk at this point appears to be low. Patient is appropriate for outpatient follow up.  Plan  MDD-unstable Start Trintellix 5 mg p.o. daily.  Will consider increasing the dosage to 10 mg in a few weeks.  Patient to monitor for side effects and let this writer know. Discussed referral for CBT however patient is moving to [place] soon and will establish care with a therapist once she relocates.  GAD-unstable Continue hydroxyzine 10 mg p.o. twice daily as needed Patient will benefit from CBT.  High risk medication use-patient will benefit from getting sodium as well as platelet count repeated in a week from now since she is on an antidepressant.  Patient provided lab slip.  Patient provided medication education, discussed side effects.  Collaboration of Care: Referral or follow-up with counselor/therapist AEB patient Jean Stout to establish care with therapist. I have reviewed notes per Dr. Kalisetti-10/27/2022-patient with diagnosis of MDD, hypertension, GERD, diabetes, chronic kidney disease stage III. Patient/Guardian was advised Release of Information must be obtained prior to any record release in order to collaborate their care with an outside provider. Patient/Guardian was advised if they have not already done so to contact the registration department to sign all necessary forms in order for [place] to release information regarding their care.   Consent: Patient/Guardian gives verbal consent for treatment and assignment of benefits for services provided during this visit. Patient/Guardian expressed understanding and agreed to proceed.    Follow-up in clinic in 2 weeks or sooner if needed.  This note was generated in part or whole with voice recognition software. Voice recognition is usually quite accurate but there are transcription errors that can and very often do  occur. I apologize for any typographical errors that were not detected and corrected.    Jomarie Longs, MD 9/10/20244:28 PM

## 2022-12-06 NOTE — Telephone Encounter (Signed)
Patient in today for a visit with Dr Elna Breslow her b/p was elevated (154/74) checked again after her visit  (190/76) b/p was still elevated Dr Elna Breslow suggested that she sit for 20 minutes to recheck, patient was getting upset and wanted to leave because she was worried about her dog being home alone . Patient stated that she would be fine and she would monitor her b/p at home and will contact her PCP if b/p continues to be elevated

## 2022-12-13 ENCOUNTER — Ambulatory Visit: Payer: Medicare Other | Admitting: Psychiatry

## 2022-12-30 ENCOUNTER — Telehealth: Payer: Self-pay | Admitting: Psychiatry

## 2022-12-30 NOTE — Telephone Encounter (Signed)
Patient left voice message on 12/30/22. She has moved to [place] and doing well. Not on any medication at this time, she will write you a letter with the updates. Wanted you to be aware and thank you for her care.

## 2023-01-02 NOTE — Telephone Encounter (Signed)
Thank you :)

## 2023-02-17 DIAGNOSIS — I219 Acute myocardial infarction, unspecified: Secondary | ICD-10-CM | POA: Insufficient documentation

## 2023-05-25 DIAGNOSIS — M5126 Other intervertebral disc displacement, lumbar region: Secondary | ICD-10-CM | POA: Insufficient documentation

## 2023-12-28 NOTE — Progress Notes (Signed)
 Chief Complaint:   Chief Complaint  Patient presents with  . Follow-up    Subjective:   Jean Stout is a 83 y.o. female in today for: Follow-up today  History of Present Illness An 83 year old female presents for follow-up today  Patient was seen almost a year ago.  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  She moved to [place]  to be with her son from there she had to move to [place]  to be with her daughter but finally moved back to [place]  about a month ago.  She is in her own apartment at this time and independent.  Had to give up her driving because of vision issues.  She is reestablishing care with all her providers.  Has history of chronic pain syndrome, major depression, arthritis in both knees, hypertension, CKD stage III.  She has reestablished with her nephrologist.  Who noted that she had to be started back on her blood pressure medications because blood pressure has been elevated.  Also has lower extremity edema.  She has been experiencing fluid-related swelling and started Lasix (furosemide) 20 mg on September 24th by her kidney specialist..   She was seeing the pain provider here who has put her on hydrocodone  and methocarbamol .  She has a follow-up for reestablishing care next week.  Needs refill on these medications for a week.  She has a history of anemia, with hemoglobin levels dropping from 11 to 10.  She has not been taking iron supplements due to constipation    Current Outpatient Medications  Medication Sig Dispense Refill  . amLODIPine  (NORVASC ) 10 MG tablet Take 10 mg by mouth once daily    . aspirin  81 MG EC tablet Take 1 tablet (81 mg total) by mouth once daily    . atorvastatin  (LIPITOR) 40 MG tablet take 1 tablet by mouth everyday at bedtime 90 tablet 1  . cholecalciferol  (VITAMIN D3) 1000 unit tablet Take by mouth    . cyanocobalamin (VITAMIN B12) 1,000 mcg/mL injection Inject 1 mL (1,000 mcg total) into the  muscle monthly 1 mL 3  . DULoxetine  (CYMBALTA ) 30 MG DR capsule Take 3 capsules (90 mg total) by mouth once daily for 30 days Take 3 times daily 90 capsule 4  . FUROsemide (LASIX) 20 MG tablet Take 20 mg by mouth once daily    . glucosamine su 2KCl-chondroit 500-400 mg Tab Take by mouth    . hydrALAZINE  (APRESOLINE ) 50 MG tablet Take 1 tablet (50 mg total) by mouth 3 (three) times daily 270 tablet 3  . HYDROcodone -acetaminophen  (NORCO) 7.5-325 mg tablet Take 1 tablet by mouth every 6 (six) hours as needed for up to 7 days 28 tablet 0  . lisinopriL  (ZESTRIL ) 40 MG tablet take 1 tablet by mouth every day 90 tablet 1  . methocarbamoL  (ROBAXIN ) 750 MG tablet Take 1 tablet (750 mg total) by mouth 3 (three) times daily 21 tablet 0  . multivitamin tablet Take 1 tablet by mouth once daily    . omeprazole (PRILOSEC) 20 MG DR capsule TAKE 1 CAPSULE BY MOUTH TWICE A DAY BEFORE MEALS 90 capsule 1  . TURMERIC ORAL Take by mouth     No current facility-administered medications for this visit.    Allergies as of 12/28/2023 - Reviewed 12/28/2023  Allergen Reaction Noted  . Gadolinium-containing contrast media Rash 03/01/2018  . Iodinated contrast media Anaphylaxis 07/10/2018  . Amlodipine  besylate Itching 12/22/2021  . Bextra [valdecoxib] Rash  03/01/2018  . Lotrel [amlodipine -benazepril] Rash 03/01/2018    Past Medical History:  Diagnosis Date  . Allergy   . Anxiety   . Arthritis   . Depression   . Diabetes mellitus without complication (CMS/HHS-HCC)   . GERD (gastroesophageal reflux disease)   . History of myocardial infarction   . Hyperlipidemia   . Hypertension   . Sleep apnea   . Vaginal prolapse     Past Surgical History:  Procedure Laterality Date  . L2 FRACTURE  09/08/2020   Lumbar  . ARTHROPLASTY TOTAL KNEE Left 01/12/2021   by Dr. Kathlynn  . ARTHROPLASTY TOTAL KNEE Right 04/27/2021   By Dr. Kathlynn  . BTL    . CORONARY ANGIOPLASTY    . ENDOSCOPIC CARPAL TUNNEL RELEASE Right   .  KNEE ARTHROSCOPY    . 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 High blood pressure (Hypertension) Mother    . Anxiety Mother    . Myocardial Infarction (Heart attack) Father    . Sudden cardiac death Father  52  . Hyperlipidemia (Elevated cholesterol) Sister Rollo   . High blood pressure (Hypertension) Sister Rollo   . Myocardial Infarction (Heart attack) Sister Rollo   . Myocardial Infarction (Heart attack) Brother    . High blood pressure (Hypertension) Brother    . Sudden cardiac death Brother  71  . Breast cancer Brother    . Testicular cancer Brother    . High blood pressure (Hypertension) Sister Twin - 55   . Anxiety Sister Twin - 46   . Breast cancer Daughter Elida   . Myocardial Infarction (Heart attack) Son Lynwood   . Hyperlipidemia (Elevated cholesterol) Son Lynwood   . Coronary Artery Disease (Blocked arteries around heart) Son Lynwood   . Hearing loss Brother Parrott   . Atrial fibrillation (Abnormal heart rhythm sometimes requiring treatment with blood thinners) Brother Ted   . High blood pressure (Hypertension) Brother Ted   . Hyperlipidemia (Elevated cholesterol) Brother Ted   . Sleep apnea Son [place]   . Hyperlipidemia (Elevated cholesterol) Son Engineer, maintenance (IT)     Social History:  reports that she has never smoked. She has never used smokeless tobacco. She reports that she does not currently use alcohol. She reports that she does not use drugs.  Results for orders placed or performed in visit on 08/08/22  C difficile Toxin Gene NAA - [facility]  Result Value Ref Range   C difficile Toxin Gene NAA - [facility] Negative Negative   Narrative   Performed at:  [address] [place], [place]  [id number] Lab Director: Frankey Sas MD, Phone:  [phone]      ROS:  General: No fever, chills or recent illness. No change in  weight HEENT: No change in vision or hearing. No pain or difficulty with swallowing Respiratory: No cough or shortness of breath CV:  No chest pain or palpitations GI:  No pain, dyspepsia or change in bowel habits GU:  No dysuria, frequency, or hesitancy MSK:  Bilateral lower extremity edema, low back pain, bilateral knee pain Neurological: No headaches, changes in mental status, loss of sensation or strength  Psychological: No anxiety insomnia or depression  Objective:   Body mass index is 28.01 kg/m.  BP (!) 152/60   Pulse 69   Ht 157.5 cm (5' 2.01)  Wt 69.5 kg (153 lb 3.2 oz)   LMP  (LMP Unknown)   SpO2 98%   BMI 28.01 kg/m   General: WD/WN female, in no acute distress HEENT: Pupils equal and round, EOMI. oral mucosa moist.  Oropharynx clear. Neck: supple, trachea midline; no thyromegaly Respiratory:clear to auscultation.  No dullness to percussion.  No use of accessory muscles. Cardiac:  Regular rate and rhythm without murmur, gallops, or rubs Vascular: No carotid bruit and radials 2+; distal pulses 2+ Abdominal:soft, nontender, positive bowel sounds.  No organomegaly. Musculoskeletal:  No clubbing, cyanosis.  Has 4+ lower extremity edema all the way below the knees Neuro: CN grossly intact.  No acute decrease in sensation in the upper and lower extremities bilaterally. Lymph: no cervical or supraclavicular lymphadenopathy   Assessment/Plan:   Type 2 diabetes mellitus without complication, without long-term current use of insulin  (CMS/HHS-HCC)  (primary encounter diagnosis) Need for vaccination Essential hypertension MDD (major depressive disorder), recurrent episode, moderate (CMS-HCC) Stage 3a chronic kidney disease (CMS-HCC) Hyperparathyroidism due to renal insufficiency (HHS-HCC)  Assessment & Plan  1.  Hypertension- - Blood pressure is still elevated however has been just restarted back on her medications - Followed with nephrology yesterday - On  amlodipine , lisinopril , hydralazine  - Lasix has been added - Low-salt diet and continue to monitor blood pressure.  2. Lower extremity edema - Swelling due to fluid retention,  - Continue furosemide 20 mg daily. - Consider Una boots if swelling persists. - Also increase Lasix dose for the next [redacted] weeks along with potassium supplements.  3. Chronic kidney disease, stage 2  -Avoid nephrotoxins - On lisinopril  - Blood pressure control needed - Follow-up with nephrology.  Labs done through kidney specialist last month.  4.  Chronic pain syndrome and chronic low back pain -Reestablishing with the pain clinic - 1 week worth of Norco and methocarbamol  filled in.  She has been chronically on these medications followed by the pain clinic.   5. Iron deficiency anemia -Anemia of chronic disease.  Advised to restart iron supplements - Continue fiber gummies to alleviate constipation.  6.  Depression- - Restarted Cymbalta  which she has been taking for neuropathy pain and also to help with the depression.    Follow-up with me in 3 months for follow-up of hypertension, lower extremity edema, CKD     Goals     . * Lose Weight (pt-stated)        LAVENIA BEAVER, MD  Portions of this note were created using dictation software and may contain typographical errors.  *Some images could not be shown.

## 2024-01-02 ENCOUNTER — Ambulatory Visit
Admission: RE | Admit: 2024-01-02 | Discharge: 2024-01-02 | Disposition: A | Discharge status: Home / Self Care | Source: Ambulatory Visit | Attending: Nurse Practitioner | Admitting: Nurse Practitioner

## 2024-01-02 ENCOUNTER — Ambulatory Visit
Admission: RE | Admit: 2024-01-02 | Discharge: 2024-01-02 | Disposition: A | Source: Ambulatory Visit | Attending: Nurse Practitioner | Admitting: Nurse Practitioner

## 2024-01-02 ENCOUNTER — Ambulatory Visit: Admitting: Nurse Practitioner

## 2024-01-02 ENCOUNTER — Encounter: Payer: Self-pay | Admitting: Nurse Practitioner

## 2024-01-02 VITALS — BP 153/76 | Temp 97.2°F | Resp 18 | Ht 60.0 in | Wt 148.0 lb

## 2024-01-02 DIAGNOSIS — G8929 Other chronic pain: Secondary | ICD-10-CM | POA: Insufficient documentation

## 2024-01-02 DIAGNOSIS — Z79899 Other long term (current) drug therapy: Secondary | ICD-10-CM | POA: Insufficient documentation

## 2024-01-02 DIAGNOSIS — G894 Chronic pain syndrome: Secondary | ICD-10-CM | POA: Insufficient documentation

## 2024-01-02 DIAGNOSIS — M47816 Spondylosis without myelopathy or radiculopathy, lumbar region: Secondary | ICD-10-CM

## 2024-01-02 DIAGNOSIS — Z0289 Encounter for other administrative examinations: Secondary | ICD-10-CM | POA: Insufficient documentation

## 2024-01-02 DIAGNOSIS — M542 Cervicalgia: Secondary | ICD-10-CM

## 2024-01-02 DIAGNOSIS — Z7189 Other specified counseling: Secondary | ICD-10-CM | POA: Insufficient documentation

## 2024-01-02 MED ORDER — METHOCARBAMOL 750 MG PO TABS: 750.0000 mg | ORAL_TABLET | Freq: Four times a day (QID) | ORAL | 2 refills | Status: DC | PRN

## 2024-01-02 MED ORDER — HYDROCODONE-ACETAMINOPHEN 7.5-325 MG PO TABS: 1.0000 | ORAL_TABLET | Freq: Four times a day (QID) | ORAL | 0 refills | Status: DC | PRN

## 2024-01-02 NOTE — Progress Notes (Signed)
 Nursing Pain Medication Assessment:  Safety precautions to be maintained throughout the outpatient stay will include: orient to surroundings, keep bed in low position, maintain call bell within reach at all times, provide assistance with transfer out of bed and ambulation.  Medication Inspection Compliance: [name] did not comply with our request to bring her pills to be counted. She was reminded that bringing the medication bottles, even when empty, is a requirement.  Medication: None brought in. Pill/Patch Count: None available to be counted. Bottle Appearance: No container available. Did not bring bottle(s) to appointment. Filled Date: N/A Last Medication intake:  TodaySafety precautions to be maintained throughout the outpatient stay will include: orient to surroundings, keep bed in low position, maintain call bell within reach at all times, provide assistance with transfer out of bed and ambulation.

## 2024-01-02 NOTE — Progress Notes (Signed)
 PROVIDER NOTE: Interpretation of information contained herein should be left to medically-trained personnel. Specific patient instructions are provided elsewhere under Patient Instructions section of medical record. This document was created in part using AI and STT-dictation technology, any transcriptional errors that may result from this process are unintentional.  Patient: Jean Stout  Service: E/M   PCP: Sherial Bail, MD  DOB: June 28, 1940  DOS: 01/02/2024  Provider: Emmy MARLA Blanch, NP  MRN: [id number]  Delivery: Face-to-face  Specialty: Interventional Pain Management  Type: Established Patient  Setting: Ambulatory outpatient facility  Specialty designation: 09  Referring Prov.: Sherial Bail, MD  Location: Outpatient office facility       History of present illness (HPI) [name], a 83 y.o. year old female, is here today because of her Lumbar facet arthropathy [M47.816]. [name]'s primary complain today is Back Pain (lower)  Pertinent problems: [name] has Type 2 diabetes mellitus without complication, without long-term current use of insulin  (HCC); Osteoarthritis of multiple joints; Primary osteoarthritis of left knee; Localized primary osteoarthritis of shoulder regions, bilateral; Right wrist pain; Chronic pain syndrome; Polyarthralgia; History of bilateral hip replacements; MDD (major depressive disorder), recurrent episode, moderate (HCC); GAD (generalized anxiety disorder); and Chronic pain syndrome on their pertinent problem list.  Pain Assessment: Severity of Chronic pain is reported as a 7 /10. Location: Back Lower/left leg to the ankle. Onset: More than a month ago. Quality: Other (Comment) (vibrating). Timing:  . Modifying factor(s): Hydrocodone , heating pad, elevation of legs, Robaxin . Vitals:  height is 5' (1.524 m) and weight is 148 lb (67.1 kg). Her temporal temperature is 97.2 F (36.2 C) (abnormal). Her blood pressure is 153/76 (abnormal). Her respiration  is 18 and oxygen saturation is 98%.  BMI: Estimated body mass index is 28.9 kg/m as calculated from the following:   Height as of this encounter: 5' (1.524 m).   Weight as of this encounter: 148 lb (67.1 kg).  Last encounter: 02/16/2022 Last procedure: Visit date not found.  Reason for encounter: medication management. No change in medical history since last visit.  Patient's pain is at baseline.  Patient continues multimodal pain regimen as prescribed.  States that it provides pain relief and improvement in functional status.  The patient continues struggling with lower back pain that significantly impacts her daily activities.  Her last visit to the pain clinic was on 02/16/2022.  She recently moved back to the [place]  from [place] , where she received 2 lumbar facet blocks that provided limited pain relief, though she is unable to specify the duration of the benefit. Pharmacotherapy Assessment   Started Norco 7.5-325 mg tablet every 6 hours as needed for pain Methocarbamol  750 mg tablet every 6 hours as needed for pain Monitoring: [place] PMP: PDMP reviewed during this encounter.       Pharmacotherapy: No side-effects or adverse reactions reported. Compliance: No problems identified. Effectiveness: Clinically acceptable.  [name], RN  01/02/2024  3:12 PM  Sign when Signing Visit Nursing Pain Medication Assessment:  Safety precautions to be maintained throughout the outpatient stay will include: orient to surroundings, keep bed in low position, maintain call bell within reach at all times, provide assistance with transfer out of bed and ambulation.  Medication Inspection Compliance: Ms. [name] did not comply with our request to bring her pills to be counted. She was reminded that bringing the medication bottles, even when empty, is a requirement.  Medication: None brought in. Pill/Patch Count: None available to be counted. Bottle Appearance: No  container available. Did not bring  bottle(s) to appointment. Filled Date: N/A Last Medication intake:  TodaySafety precautions to be maintained throughout the outpatient stay will include: orient to surroundings, keep bed in low position, maintain call bell within reach at all times, provide assistance with transfer out of bed and ambulation.     UDS:  Summary  Date Value Ref Range Status  05/17/2021 Note  Final    Comment:    ==================================================================== Tox[employer] Select 13 (MW) ==================================================================== Test                             Result       Flag       Units  Drug Present and Declared for Prescription Verification   Hydrocodone                     >4608        EXPECTED   ng/mg creat   Hydromorphone                   132          EXPECTED   ng/mg creat   Dihydrocodeine                 547          EXPECTED   ng/mg creat   Norhydrocodone                 >2304        EXPECTED   ng/mg creat    Sources of hydrocodone  include scheduled prescription medications.    Hydromorphone , dihydrocodeine and norhydrocodone are expected    metabolites of hydrocodone . Hydromorphone  and dihydrocodeine are    also available as scheduled prescription medications.  Drug Present not Declared for Prescription Verification   Oxycodone                       1116         UNEXPECTED ng/mg creat   Oxymorphone                    136          UNEXPECTED ng/mg creat   Noroxymorphone                 102          UNEXPECTED ng/mg creat    Sources of oxycodone  include scheduled prescription medications.    Oxymorphone and noroxymorphone are expected metabolites of oxycodone .    Oxymorphone is also available as a scheduled prescription    medication.  ==================================================================== Test                      Result    Flag   Units      Ref Range   Creatinine              217              mg/dL       >=79 ==================================================================== Declared Medications:  The flagging and interpretation on this report are based on the  following declared medications.  Unexpected results may arise from  inaccuracies in the declared medications.   **Note: The testing scope of this panel includes these medications:   Hydrocodone  (Norco)   **Note: The testing scope of this panel does not include the  following reported medications:   Acetaminophen  (  Norco)  Amlodipine  (Norvasc )  Atorvastatin  (Lipitor)  Chondroitin  Diclofenac (Voltaren)  Docusate (Colace)  Duloxetine  (Cymbalta )  Enoxaparin  (Lovenox )  Famotidine  (Pepcid )  Glucosamine  Hydralazine  (Apresoline )  Iron  Lisinopril  (Zestril )  Magnesium   Metformin   Methocarbamol  (Robaxin )  Omeprazole (Prilosec)  Vitamin D3 ==================================================================== For clinical consultation, please call [phone]. ====================================================================     No results found for: CBDTHCR No results found for: D8THCCBX No results found for: D9THCCBX  ROS  Constitutional: Denies any fever or chills Gastrointestinal: No reported hemesis, hematochezia, vomiting, or acute GI distress Musculoskeletal: Lower back pain Neurological: No reported episodes of acute onset apraxia, aphasia, dysarthria, agnosia, amnesia, paralysis, loss of coordination, or loss of consciousness  Medication Review  DULoxetine , HYDROcodone -acetaminophen , Turmeric, amLODipine , aspirin  EC, atorvastatin , cholecalciferol , diclofenac Sodium, ferrous sulfate, furosemide, glucosamine-chondroitin, hydrALAZINE , lisinopril , methocarbamol , multivitamin with minerals, and omeprazole  History Review  Allergy: Ms. Delph is allergic to gadolinium derivatives, hm lidocaine  patch [lidocaine ], iodinated contrast media, lotrel [amlodipine  besy-benazepril hcl], and bextra  [valdecoxib]. Drug: Ms. Isaacs  reports no history of drug use. Alcohol:  reports current alcohol use of about 1.0 standard drink of alcohol per week. Tobacco:  reports that she has never smoked. She has never used smokeless tobacco. Social: Ms. Podolski  reports that she has never smoked. She has never used smokeless tobacco. She reports current alcohol use of about 1.0 standard drink of alcohol per week. She reports that she does not use drugs. Medical:  has a past medical history of Anxiety and depression, Aortic stenosis, Arthritis, Cardiac murmur, CKD (chronic kidney disease), stage III (HCC), Coronary artery disease, GERD (gastroesophageal reflux disease), History of 2019 novel coronavirus disease (COVID-19) (12/29/2020), History of MI (myocardial infarction) (2010), HLD (hyperlipidemia), HTN (hypertension), IDA (iron deficiency anemia), Mild mitral regurgitation, Myocardial infarction (HCC) (2010), OSA on CPAP, Osteopenia, PONV (postoperative nausea and vomiting), T2DM (type 2 diabetes mellitus) (HCC), and Vaginal prolapse. Surgical: Ms. Schickling  has a past surgical history that includes Carotid stent; Joint replacement (Bilateral); Coronary angioplasty with stent (N/A, 2010); Eye surgery; Carpal tunnel release (Left, 1983); Cyst excision (1984); Kyphoplasty (N/A, 09/08/2020); Total knee arthroplasty (Left, 01/12/2021); Total knee arthroplasty (Right, 04/27/2021); Total hip arthroplasty (Right, 03/2005); Total hip arthroplasty (Left, 05/2005); and Knee Closed Reduction (Right, 05/27/2021). Family: family history includes Anxiety disorder in her mother; Bipolar disorder in her sister; Cancer in her daughter; Heart attack in her father and son; Hyperlipidemia in her son; Hypertension in her mother and son; Sleep apnea in her son; Sudden Cardiac Death (age of onset: 86) in her father; Sudden Cardiac Death (age of onset: 32) in her brother.  Laboratory Chemistry Profile   Renal Lab Results  Component Value  Date   BUN 18 04/28/2021   CREATININE 0.89 04/28/2021   GFRNONAA >60 04/28/2021    Hepatic Lab Results  Component Value Date   AST 27 04/15/2021   ALT 20 04/15/2021   ALBUMIN 4.1 04/15/2021   ALKPHOS 87 04/15/2021    Electrolytes Lab Results  Component Value Date   NA 134 (L) 04/28/2021   K 4.2 04/28/2021   CL 104 04/28/2021   CALCIUM  8.8 (L) 04/28/2021    Bone No results found for: VD25OH, VD125OH2TOT, CI6874NY7, CI7874NY7, 25OHVITD1, 25OHVITD2, 25OHVITD3, TESTOFREE, TESTOSTERONE  Inflammation (CRP: Acute Phase) (ESR: Chronic Phase) No results found for: CRP, ESRSEDRATE, LATICACIDVEN       Note: Above Lab results reviewed.  Recent Imaging Review  US  Venous Img Lower Unilateral Left (DVT) CLINICAL DATA:  Left lower pain and edema.  Evaluate for DVT.  EXAM: LEFT LOWER EXTREMITY VENOUS DOPPLER ULTRASOUND  TECHNIQUE: Gray-scale sonography with graded compression, as well as color Doppler and duplex ultrasound were performed to evaluate the lower extremity deep venous systems from the level of the common femoral vein and including the common femoral, femoral, profunda femoral, popliteal and calf veins including the posterior tibial, peroneal and gastrocnemius veins when visible. The superficial great saphenous vein was also interrogated. Spectral Doppler was utilized to evaluate flow at rest and with distal augmentation maneuvers in the common femoral, femoral and popliteal veins.  COMPARISON:  Left lower extremity venous Doppler ultrasound-10/29/2019 (negative).  FINDINGS: Contralateral Common Femoral Vein: Respiratory phasicity is normal and symmetric with the symptomatic side. No evidence of thrombus. Normal compressibility.  Common Femoral Vein: No evidence of thrombus. Normal compressibility, respiratory phasicity and response to augmentation.  Saphenofemoral Junction: No evidence of thrombus. Normal compressibility and flow on color  Doppler imaging.  Profunda Femoral Vein: No evidence of thrombus. Normal compressibility and flow on color Doppler imaging.  Femoral Vein: No evidence of thrombus. Normal compressibility, respiratory phasicity and response to augmentation.  Popliteal Vein: No evidence of thrombus. Normal compressibility, respiratory phasicity and response to augmentation.  Calf Veins: No evidence of thrombus. Normal compressibility and flow on color Doppler imaging.  Superficial Great Saphenous Vein: No evidence of thrombus. Normal compressibility.  Other Findings:  None.  IMPRESSION: No evidence of DVT within the left lower extremity.  Electronically Signed   By: Norleen Roulette M.D.   On: 06/15/2022 12:09 Note: Reviewed        Physical Exam  Vitals: BP (!) 153/76 (Cuff Size: Normal)   Temp (!) 97.2 F (36.2 C) (Temporal)   Resp 18   Ht 5' (1.524 m)   Wt 148 lb (67.1 kg)   SpO2 98%   BMI 28.90 kg/m  BMI: Estimated body mass index is 28.9 kg/m as calculated from the following:   Height as of this encounter: 5' (1.524 m).   Weight as of this encounter: 148 lb (67.1 kg). Ideal: Ideal body weight: 45.5 kg (100 lb 4.9 oz) Adjusted ideal body weight: 54.2 kg (119 lb 6.2 oz) General appearance: Well nourished, well developed, and well hydrated. In no apparent acute distress Mental status: Alert, oriented x 3 (person, place, & time)       Respiratory: No evidence of acute respiratory distress Eyes: PERLA  Musculoskeletal: + LBP (facet loading)  Cervical Spine Exam  Skin & Axial Inspection: No masses, redness, edema, swelling, or associated skin lesions Alignment: Symmetrical Functional ROM: Pain restricted ROM, bilaterally Stability: No instability detected Muscle Tone/Strength: Functionally intact. No obvious neuro-muscular anomalies detected. Sensory (Neurological): Musculoskeletal pain pattern Palpation: No palpable anomalies             Lumbar Exam  Skin & Axial Inspection: No  masses, redness, or swelling Alignment: Symmetrical Functional ROM: Pain restricted ROM       Stability: No instability detected Muscle Tone/Strength: Functionally intact. No obvious neuro-muscular anomalies detected. Sensory (Neurological): Musculoskeletal pain pattern Palpation: No palpable anomalies       Provocative Tests: Hyperextension/rotation test: (+) bilaterally for facet joint pain. Lumbar quadrant test (Kemp's test): (+) bilaterally for facet joint pain. Lateral bending test: (+) due to pain.  *(Flexion, ABduction and External Rotation) Upper Extremity (UE) Exam      Right  Left  Inspection    Skin color, temperature, and hair growth are WNL. No peripheral edema or cyanosis. No masses, redness, swelling, asymmetry,  or associated skin lesions. No contractures.  Skin color, temperature, and hair growth are WNL. No peripheral edema or cyanosis. No masses, redness, swelling, asymmetry, or associated skin lesions. No contractures.          Functional ROM    Pain restricted ROM          Pain restricted ROM                  Muscle Tone/Strength    Functionally intact. No obvious neuro-muscular anomalies detected.  Functionally intact. No obvious neuro-muscular anomalies detected.          Sensory (Neurological)    Musculoskeletal pain pattern          Musculoskeletal pain pattern                  Palpation    No palpable anomalies              No palpable anomalies                      Maneuver Shoulder abduction (deltoid/supraspinatus, axillary/supra scapular n,, C5) Elbow flexion (biceps brachial, musculoskeletal n, C5-6) Elbow extension (triceps, radial n, C7) Finger abduction (interossei, ulnar n, T1)    Shoulder abduction (deltoid/supraspinatus, axillary/supra scapular n,, C5) Elbow flexion (biceps brachial, musculoskeletal n, C5-6) Elbow extension (triceps, radial n, C7) Wrist extensors (C6) Finger extensors (C8) Finger abduction (interossei, ulnar n, T1)             Provocative Test    Phalen's test: deferred Tinel's test: deferred Apley's scratch test (touch opposite shoulder):  Action 1 (Across chest): deferred Action 2 (Overhead): deferred Action 3 (LB reach): deferred  Phalen's test: deferred Tinel's test: deferred Apley's scratch test (touch opposite shoulder):  Action 1 (Across chest): deferred Action 2 (Overhead): deferred Action 3 (LB reach): deferred             Level  Myotome  Dermatome  Sclerotome  ROM  C5  Elbow flexion  Lateral upper arm      C6  Wrist extension  Thumb and index      C7  Elbow extension  Middle finger      C8  Finger extension  Ring and pinky finger      T1  Finger abduction  Medial elbow and axilla                                                                                                                                         Assessment   Diagnosis Status  1. Lumbar facet arthropathy   2. Lumbar spondylosis   3. Chronic pain syndrome   4. Medication management   5. Pain management contract signed   6. Pain management contract discussed   7. Chronic cervical pain    Persistent Controlled Controlled   Updated Problems: Problem  Chronic Cervical  Pain    Plan of Care  Problem-specific:  Assessment and Plan Chronic pain syndrome: Started hydrocodone -acetaminophen  7.5-325 mg every 6 hours as needed for pain Started Robaxin  750 mg tablet every 6 hours as needed for musculoskeletal pain. Continue on current medication regimen, prescribing drug monitoring (PDMP) reviewed. Routine UDS ordered today.  Schedule follow-up in 90 days for medication management.  Lumbar facet arthropathy: he patient continues struggling with lower back pain that significantly impacts her daily activities. She recently moved back to the [place]  from [place] , where she received 2 lumbar facet blocks that provided limited pain relief, though she is unable to specify the duration of the benefit.  We also  discussed lumbar radiofrequency ablation (RFA), which targets to ablate the nerves transmitting pain signal to provide longer term relief of chronic low back pain.  Plan: Lumbar x-ray Cervical x-ray  Ms. Eilah Common has a current medication list which includes the following long-term medication(s): amlodipine , atorvastatin , furosemide, hydralazine , lisinopril , and omeprazole.  Pharmacotherapy (Medications Ordered): Meds ordered this encounter  Medications   HYDROcodone -acetaminophen  (NORCO) 7.5-325 MG tablet    Sig: Take 1 tablet by mouth every 6 (six) hours as needed for moderate pain (pain score 4-6).    Dispense:  30 tablet    Refill:  0   HYDROcodone -acetaminophen  (NORCO) 7.5-325 MG tablet    Sig: Take 1 tablet by mouth every 6 (six) hours as needed for moderate pain (pain score 4-6).    Dispense:  30 tablet    Refill:  0   HYDROcodone -acetaminophen  (NORCO) 7.5-325 MG tablet    Sig: Take 1 tablet by mouth every 6 (six) hours as needed for moderate pain (pain score 4-6).    Dispense:  30 tablet    Refill:  0   methocarbamol  (ROBAXIN ) 750 MG tablet    Sig: Take 1 tablet (750 mg total) by mouth every 6 (six) hours as needed for muscle spasms.    Dispense:  90 tablet    Refill:  2   Orders:  Orders Placed This Encounter  Procedures   DG Cervical Spine With Flex & Extend    Patient presents with axial pain with possible radicular component.  Please evaluate for any evidence of cervical spine instability. Describe the presence of any spondylolisthesis (Antero- or retrolisthesis). If present, provide displacement Grade and measurement in cm. Please describe presence and specific location (Level & Laterality) of any signs of  osteoarthritis, zygapophyseal (Facet) joints DJD (including decreased joint space and/or osteophytosis), DDD, Foraminal narrowing, as well as any sclerosis and/or cyst formation. Please comment on ROM. Patient presents with axial pain with possible radicular  component. Please assist us  in identifying specific level(s) and laterality of any additional findings such as: 1. Facet (Zygapophyseal) joint DJD (Hypertrophy, space narrowing, subchondral sclerosis, and/or osteophyte formation) 2. DDD and/or IVDD (Loss of disc height, desiccation, gas patterns, osteophytes, endplate sclerosis, or Black disc disease) 3. Pars defects 4. Spondylolisthesis, spondylosis, and/or spondyloarthropathies (include Degree/Grade of displacement in mm) (stability) 5. Vertebral body Fractures (acute/chronic) (state percentage of collapse) 6. Demineralization (osteopenia/osteoporotic) 7. Bone pathology 8. Foraminal narrowing  9. Surgical changes    Standing Status:   Future    Number of Occurrences:   1    Expected Date:   01/02/2024    Expiration Date:   04/03/2024    Scheduling Instructions:     Please make sure that the patient understands that this needs to be done as soon as possible. Never have the patient do  the imaging just before the next appointment. Inform patient that having the imaging done within the [facility] will expedite the availability of the results and will provide      imaging availability to the requesting physician. In addition inform the patient that the imaging order has an expiration date and will not be renewed if not done within the active period.    Reason for Exam (SYMPTOM  OR DIAGNOSIS REQUIRED):   Cervicalgia, chronic neck pain    Preferred imaging location?:   [facility]    Call Results- Best Contact Number?:   [phone] [facility]    Radiology Contrast Protocol - do NOT remove file path:   [email or web address]    Release to patient:   Immediate   DG Lumbar Spine Complete W/Bend    Patient presents with axial pain with possible radicular component. Please assist us  in identifying specific level(s) and laterality of any additional  findings such as: 1. Facet (Zygapophyseal) joint DJD (Hypertrophy, space narrowing, subchondral sclerosis, and/or osteophyte formation) 2. DDD and/or IVDD (Loss of disc height, desiccation, gas patterns, osteophytes, endplate sclerosis, or Black disc disease) 3. Pars defects 4. Spondylolisthesis, spondylosis, and/or spondyloarthropathies (include Degree/Grade of displacement in mm) (stability) 5. Vertebral body Fractures (acute/chronic) (state percentage of collapse) 6. Demineralization (osteopenia/osteoporotic) 7. Bone pathology 8. Foraminal narrowing  9. Surgical changes    Standing Status:   Future    Number of Occurrences:   1    Expiration Date:   04/03/2024    Scheduling Instructions:     Please make sure that the patient understands that this needs to be done as soon as possible. Never have the patient do the imaging just before the next appointment. Inform patient that having the imaging done within the [facility] will expedite the availability of the results and will provide      imaging availability to the requesting physician. In addition inform the patient that the imaging order has an expiration date and will not be renewed if not done within the active period.    Reason for Exam (SYMPTOM  OR DIAGNOSIS REQUIRED):   Low back pain    Preferred imaging location?:   [facility]    Call Results- Best Contact Number?:   [phone] [facility] at [facility]    Radiology Contrast Protocol - do NOT remove file path:   [email or web address]    Release to patient:   Immediate   ToxASSURE Select 13 (MW), Urine    Volume: 30 ml(s). Minimum 3 ml of urine is needed. Document temperature of fresh sample. Indications: Long term (current) use of opiate analgesic (S20.108)    Release to patient:   Immediate        Return in about 2 weeks (around 01/16/2024) for (VV), review of ordered tests, Emmy Blanch  NP.    Recent Visits No visits were found meeting these conditions. Showing recent visits within past 90 days and meeting all other requirements Today's Visits Date Type Provider Dept  01/02/24 Office Visit Yojan Paskett K, NP [facility]  Showing today's visits and meeting all other requirements Future Appointments Date Type Provider Dept  01/17/24 Appointment Darriana Deboy K, NP [facility]  03/27/24 Appointment Claudis Giovanelli K, NP [facility]  Showing future appointments within next 90 days and meeting all other requirements  I discussed the assessment and treatment plan with the patient. The patient was provided an  opportunity to ask questions and all were answered. The patient agreed with the plan and demonstrated an understanding of the instructions.  Patient advised to call back or seek an in-person evaluation if the symptoms or condition worsens.  I personally spent a total of 30 minutes in the care of the patient today including preparing to see the patient, getting/reviewing separately obtained history, performing a medically appropriate exam/evaluation, counseling and educating, placing orders, referring and communicating with other health care professionals, documenting clinical information in the EHR, independently interpreting results, communicating results, and coordinating care.   Note by: Emmy MARLA Blanch, NP  Date: 01/02/2024; Time: 3:13 PM

## 2024-01-05 ENCOUNTER — Telehealth: Payer: Self-pay

## 2024-01-05 NOTE — Telephone Encounter (Signed)
 Patient called and states that [facility] didn't get her [facility]in  script.  The scripts were sent to [facility] home [facility].  I called them and the Robaxin  and Hydrocodone  have already been shipped.  Patient notified.  I questioned which pharmacy she wanted her meds to be sent to and she said [facility] which is what is listed in her profile.

## 2024-01-06 LAB — TOXASSURE SELECT 13 (MW), URINE

## 2024-01-10 ENCOUNTER — Telehealth: Payer: Self-pay | Admitting: *Deleted

## 2024-01-10 ENCOUNTER — Telehealth: Payer: Self-pay | Admitting: Nurse Practitioner

## 2024-01-10 ENCOUNTER — Other Ambulatory Visit: Payer: Self-pay | Admitting: Nurse Practitioner

## 2024-01-10 DIAGNOSIS — M47816 Spondylosis without myelopathy or radiculopathy, lumbar region: Secondary | ICD-10-CM

## 2024-01-10 DIAGNOSIS — G894 Chronic pain syndrome: Secondary | ICD-10-CM

## 2024-01-10 DIAGNOSIS — Z79899 Other long term (current) drug therapy: Secondary | ICD-10-CM

## 2024-01-10 MED ORDER — HYDROCODONE-ACETAMINOPHEN 7.5-325 MG PO TABS: 1.0000 | ORAL_TABLET | Freq: Four times a day (QID) | ORAL | 0 refills | Status: DC | PRN

## 2024-01-10 MED ORDER — HYDROCODONE-ACETAMINOPHEN 7.5-325 MG PO TABS
1.0000 | ORAL_TABLET | Freq: Four times a day (QID) | ORAL | 0 refills | Status: DC | PRN
Start: 2024-03-05 — End: 2024-02-05

## 2024-01-10 NOTE — Telephone Encounter (Signed)
 Called patient back to let her know that [facility] sent in another Rx for correct qty.  Told her she would have to wait until November fill for corrected qty.  Patient states she is unable to wait until Friday and then she will be out.  She would like enough pain med to last until her next fill.

## 2024-01-10 NOTE — Telephone Encounter (Signed)
 PT stated that she only have 30 pills and she is taking them every 6 hours. She needs a new prescription for 30 days.

## 2024-01-11 ENCOUNTER — Telehealth: Payer: Self-pay | Admitting: *Deleted

## 2024-01-11 NOTE — Telephone Encounter (Signed)
 Attempted to reach patient to discuss Tramadol  being sent in.

## 2024-01-15 ENCOUNTER — Other Ambulatory Visit: Payer: Self-pay | Admitting: Nurse Practitioner

## 2024-01-15 DIAGNOSIS — Z79899 Other long term (current) drug therapy: Secondary | ICD-10-CM

## 2024-01-15 DIAGNOSIS — G894 Chronic pain syndrome: Secondary | ICD-10-CM

## 2024-01-15 DIAGNOSIS — M47816 Spondylosis without myelopathy or radiculopathy, lumbar region: Secondary | ICD-10-CM

## 2024-01-15 MED ORDER — TRAMADOL HCL 50 MG PO TABS: 50.0000 mg | ORAL_TABLET | Freq: Three times a day (TID) | ORAL | 0 refills | Status: DC | PRN

## 2024-01-15 MED ORDER — TRAMADOL HCL 50 MG PO TABS: 50.0000 mg | ORAL_TABLET | Freq: Three times a day (TID) | ORAL | 0 refills | Status: AC | PRN

## 2024-01-17 ENCOUNTER — Telehealth: Admitting: Nurse Practitioner

## 2024-01-18 ENCOUNTER — Ambulatory Visit: Attending: Nurse Practitioner | Admitting: Nurse Practitioner

## 2024-01-18 ENCOUNTER — Encounter: Payer: Self-pay | Admitting: Nurse Practitioner

## 2024-01-18 VITALS — BP 144/70 | HR 66 | Temp 97.2°F | Resp 16 | Ht 62.0 in | Wt 141.0 lb

## 2024-01-18 DIAGNOSIS — M542 Cervicalgia: Secondary | ICD-10-CM | POA: Diagnosis present

## 2024-01-18 DIAGNOSIS — G8929 Other chronic pain: Secondary | ICD-10-CM | POA: Insufficient documentation

## 2024-01-18 DIAGNOSIS — G894 Chronic pain syndrome: Secondary | ICD-10-CM | POA: Insufficient documentation

## 2024-01-18 DIAGNOSIS — Z9889 Other specified postprocedural states: Secondary | ICD-10-CM | POA: Diagnosis present

## 2024-01-18 DIAGNOSIS — M47816 Spondylosis without myelopathy or radiculopathy, lumbar region: Secondary | ICD-10-CM | POA: Diagnosis present

## 2024-01-18 NOTE — Progress Notes (Signed)
 Safety precautions to be maintained throughout the outpatient stay will include: orient to surroundings, keep bed in low position, maintain call bell within reach at all times, provide assistance with transfer out of bed and ambulation.

## 2024-01-18 NOTE — Progress Notes (Signed)
 PROVIDER NOTE: Interpretation of information contained herein should be left to medically-trained personnel. Specific patient instructions are provided elsewhere under Patient Instructions section of medical record. This document was created in part using AI and STT-dictation technology, any transcriptional errors that may result from this process are unintentional.  Patient: Jean Stout  Service: E/M   PCP: Sherial Bail, MD  DOB: May 21, 1940  DOS: 01/18/2024  Provider: Emmy MARLA Blanch, NP  MRN: [id number]  Delivery: Face-to-face  Specialty: Interventional Pain Management  Type: Established Patient  Setting: Ambulatory outpatient facility  Specialty designation: 09  Referring Prov.: Sherial Bail, MD  Location: Outpatient office facility       History of present illness (HPI) Jean Stout, a 83 y.o. year old female, is here today because of her Chronic cervical pain [M54.2, G89.29]. Jean Stout's primary complain today is Hip Pain (left), Neck pain.   Pertinent problems: Jean Stout  has Type 2 diabetes mellitus without complication, without long-term current use of insulin  (HCC); Osteoarthritis of multiple joints; Primary osteoarthritis of left knee; Localized primary osteoarthritis of shoulder regions, bilateral; Right wrist pain; Chronic pain syndrome; Polyarthralgia; History of bilateral hip replacements; MDD (major depressive disorder), recurrent episode, moderate (HCC); GAD (generalized anxiety disorder); and Chronic pain syndrome on their pertinent problem list.  Pain Assessment: Severity of Chronic pain is reported as a 10-Worst pain ever/10. Location: Hip Left/usually stays in left hip but  sometimes will go down side of leg to ankle. Onset: More than a month ago. Quality: Numbness, Discomfort, Burning, Aching. Timing: Constant. Modifying factor(s): rest, elevate legs. Vitals:  height is 5' 2 (1.575 m) and weight is 141 lb (64 kg). Her temperature is 97.2 F (36.2 C) (abnormal).  Her blood pressure is 144/70 (abnormal) and her pulse is 66. Her respiration is 16 and oxygen saturation is 97%.  BMI: Estimated body mass index is 25.79 kg/m as calculated from the following:   Height as of this encounter: 5' 2 (1.575 m).   Weight as of this encounter: 141 lb (64 kg).  Last encounter: 01/02/2024. Last procedure: Visit date not found.  Reason for encounter: evaluation for possible interventional PM therapy/treatment.   Discussed the use of AI scribe software for clinical note transcription with the patient, who gave verbal consent to proceed.  History of Present Illness   Jean Stout is an 83 year old female who presents with neck and back pain. She continues experiences neck pain and tingling down her arm, associated with cervical spine issues. She also has back pain and has been told she has degenerative changes with anterolisthesis in her lumbar spine.  We discussed to schedule her for cervical facet block.   She takes tramadol  50 mg every six hours for pain management and 81 mg of aspirin .  She traveled to the appointment by bus, indicating a lack of personal transportation, which impacts her ability to attend medical appointments.     Pharmacotherapy Assessment   Monitoring: [place] PMP: PDMP not reviewed this encounter.       Pharmacotherapy: No side-effects or adverse reactions reported. Compliance: No problems identified. Effectiveness: Clinically acceptable.  Dorlene Montie FALCON, RN  01/18/2024  1:50 PM  Sign when Signing Visit Safety precautions to be maintained throughout the outpatient stay will include: orient to surroundings, keep bed in low position, maintain call bell within reach at all times, provide assistance with transfer out of bed and ambulation.     UDS:  Summary  Date Value Ref Range Status  01/02/2024  FINAL  Final    Comment:    ==================================================================== [employer] [facility]  (MW) ==================================================================== Test                             Result       Flag       Units  Drug Present and Declared for Prescription Verification   Hydrocodone                     3325         EXPECTED   ng/mg creat   Hydromorphone                   155          EXPECTED   ng/mg creat   Dihydrocodeine                 403          EXPECTED   ng/mg creat   Norhydrocodone                 4108         EXPECTED   ng/mg creat    Sources of hydrocodone  include scheduled prescription medications.    Hydromorphone , dihydrocodeine and norhydrocodone are expected    metabolites of hydrocodone . Hydromorphone  and dihydrocodeine are    also available as scheduled prescription medications.  Drug Absent but Declared for Prescription Verification   Diazepam                        Not Detected UNEXPECTED ng/mg creat ==================================================================== Test                      Result    Flag   Units      Ref Range   Creatinine              77               mg/dL      >=79 ==================================================================== Declared Medications:  The flagging and interpretation on this report are based on the  following declared medications.  Unexpected results may arise from  inaccuracies in the declared medications.   **Note: The testing scope of this panel includes these medications:   Diazepam  (Valium )  Hydrocodone  (Norco)   **Note: The testing scope of this panel does not include the  following reported medications:   Acetaminophen  (Norco)  Amlodipine  (Norvasc )  Aspirin   Atorvastatin  (Lipitor)  Chondroitin  Diclofenac (Voltaren)  Duloxetine  (Cymbalta )  Famotidine  (Pepcid )  Furosemide (Lasix)  Glucosamine  Hydralazine  (Apresoline )  Iron  Lisinopril  (Zestril )  Magnesium   Metformin   Methocarbamol  (Robaxin )  Multivitamin  Omeprazole (Prilosec)  Turmeric  Vitamin D3  Vortioxetine   (Trintellix ) ==================================================================== For clinical consultation, please call [phone]. ====================================================================     No results found for: CBDTHCR No results found for: D8THCCBX No results found for: D9THCCBX  ROS  Constitutional: Denies any fever or chills Gastrointestinal: No reported hemesis, hematochezia, vomiting, or acute GI distress Musculoskeletal: Neck pain, low back pain, hip pain Neurological: No reported episodes of acute onset apraxia, aphasia, dysarthria, agnosia, amnesia, paralysis, loss of coordination, or loss of consciousness  Medication Review  DULoxetine , HYDROcodone -acetaminophen , Turmeric, amLODipine , aspirin  EC, atorvastatin , cholecalciferol , diclofenac Sodium, ferrous sulfate, furosemide, glucosamine-chondroitin, hydrALAZINE , lisinopril , methocarbamol , multivitamin with minerals, omeprazole, and traMADol   History Review  Allergy: Ms. Pendergraph is allergic to gadolinium derivatives, hm lidocaine  patch [  lidocaine ], iodinated contrast media, lotrel [amlodipine  besy-benazepril hcl], and bextra [valdecoxib]. Drug: Ms. Oddo  reports no history of drug use. Alcohol:  reports current alcohol use of about 1.0 standard drink of alcohol per week. Tobacco:  reports that she has never smoked. She has never used smokeless tobacco. Social: Ms. Calais  reports that she has never smoked. She has never used smokeless tobacco. She reports current alcohol use of about 1.0 standard drink of alcohol per week. She reports that she does not use drugs. Medical:  has a past medical history of Anxiety and depression, Aortic stenosis, Arthritis, Cardiac murmur, CKD (chronic kidney disease), stage III (HCC), Coronary artery disease, GERD (gastroesophageal reflux disease), History of 2019 novel coronavirus disease (COVID-19) (12/29/2020), History of MI (myocardial infarction) (2010), HLD  (hyperlipidemia), HTN (hypertension), IDA (iron deficiency anemia), Mild mitral regurgitation, Myocardial infarction (HCC) (2010), OSA on CPAP, Osteopenia, PONV (postoperative nausea and vomiting), T2DM (type 2 diabetes mellitus) (HCC), and Vaginal prolapse. Surgical: Ms. Bloomquist  has a past surgical history that includes Carotid stent; Joint replacement (Bilateral); Coronary angioplasty with stent (N/A, 2010); Eye surgery; Carpal tunnel release (Left, 1983); Cyst excision (1984); Kyphoplasty (N/A, 09/08/2020); Total knee arthroplasty (Left, 01/12/2021); Total knee arthroplasty (Right, 04/27/2021); Total hip arthroplasty (Right, 03/2005); Total hip arthroplasty (Left, 05/2005); and Knee Closed Reduction (Right, 05/27/2021). Family: family history includes Anxiety disorder in her mother; Bipolar disorder in her sister; Cancer in her daughter; Heart attack in her father and son; Hyperlipidemia in her son; Hypertension in her mother and son; Sleep apnea in her son; Sudden Cardiac Death (age of onset: 50) in her father; Sudden Cardiac Death (age of onset: 82) in her brother.  Laboratory Chemistry Profile   Renal Lab Results  Component Value Date   BUN 18 04/28/2021   CREATININE 0.89 04/28/2021   GFRNONAA >60 04/28/2021    Hepatic Lab Results  Component Value Date   AST 27 04/15/2021   ALT 20 04/15/2021   ALBUMIN 4.1 04/15/2021   ALKPHOS 87 04/15/2021    Electrolytes Lab Results  Component Value Date   NA 134 (L) 04/28/2021   K 4.2 04/28/2021   CL 104 04/28/2021   CALCIUM  8.8 (L) 04/28/2021    Bone No results found for: VD25OH, VD125OH2TOT, CI6874NY7, CI7874NY7, 25OHVITD1, 25OHVITD2, 25OHVITD3, TESTOFREE, TESTOSTERONE  Inflammation (CRP: Acute Phase) (ESR: Chronic Phase) No results found for: CRP, ESRSEDRATE, LATICACIDVEN       Note: Above Lab results reviewed.  Recent Imaging Review  DG Lumbar Spine Complete W/Bend CLINICAL DATA:  Low back pain  EXAM: LUMBAR  SPINE - COMPLETE WITH BENDING VIEWS  COMPARISON:  02/10/2022  FINDINGS: Five lumbar type vertebral bodies are well visualized. Changes of prior vertebral augmentation at L2 are seen. Mild degenerative anterolisthesis of L4 on L5 and L5 on S1. No pars defects are noted. T11 compression fracture is noted as well. Flexion and extension views show no significant instability.  IMPRESSION: Degenerative changes with anterolisthesis without significant instability.  Electronically Signed   By: Oneil Devonshire M.D.   On: 01/07/2024 19:42 DG Cervical Spine With Flex & Extend CLINICAL DATA:  Cervicalgia.  EXAM: CERVICAL SPINE COMPLETE WITH FLEXION AND EXTENSION VIEWS  COMPARISON:  None Available.  FINDINGS: Mild retrolisthesis of C5 on C6. Multilevel degenerative disc disease most pronounced C4-5, C5-6 and C6-7. Multilevel anterior endplate osteophytosis. Prevertebral soft tissues unremarkable. No significant active listhesis. Oblique views are limited however there is suggestion of multilevel osseous neural foraminal narrowing. Visualized lung apices are clear. Lateral  masses articulate appropriately with the dens.  IMPRESSION: Multilevel degenerative disc and facet disease most pronounced C4-5, C5-6 and C6-7.  Electronically Signed   By: Bard Moats M.D.   On: 01/07/2024 19:39 Note: Reviewed        Physical Exam  Vitals: BP (!) 144/70   Pulse 66   Temp (!) 97.2 F (36.2 C)   Resp 16   Ht 5' 2 (1.575 m)   Wt 141 lb (64 kg)   SpO2 97%   BMI 25.79 kg/m  BMI: Estimated body mass index is 25.79 kg/m as calculated from the following:   Height as of this encounter: 5' 2 (1.575 m).   Weight as of this encounter: 141 lb (64 kg). Ideal: Ideal body weight: 50.1 kg (110 lb 7.2 oz) Adjusted ideal body weight: 55.6 kg (122 lb 10.7 oz) General appearance: Well nourished, well developed, and well hydrated. In no apparent acute distress Mental status: Alert, oriented x 3 (person,  place, & time)       Respiratory: No evidence of acute respiratory distress Eyes: PERLA  Musculoskeletal: Neck pain + LBP (facet loading)  ervical Spine Exam  Skin & Axial Inspection: No masses, redness, edema, swelling, or associated skin lesions Alignment: Symmetrical Functional ROM: Pain restricted ROM, bilaterally Stability: No instability detected Muscle Tone/Strength: Functionally intact. No obvious neuro-muscular anomalies detected. Sensory (Neurological): Musculoskeletal pain pattern Palpation: + Provocative Manure for cervical facet disease             Lumbar Exam  Skin & Axial Inspection: No masses, redness, or swelling Alignment: Symmetrical Functional ROM: Pain restricted ROM       Stability: No instability detected Muscle Tone/Strength: Functionally intact. No obvious neuro-muscular anomalies detected. Sensory (Neurological): Musculoskeletal pain pattern Palpation: No palpable anomalies       Provocative Tests: Hyperextension/rotation test: (+) bilaterally for facet joint pain. Lumbar quadrant test (Kemp's test): (+) bilaterally for facet joint pain. Lateral bending test: (+) due to pain.   *(Flexion, ABduction and External Rotation)         Upper Extremity (UE) Exam          Right   Left  Inspection       Skin color, temperature, and hair growth are WNL. No peripheral edema or cyanosis. No masses, redness, swelling, asymmetry, or associated skin lesions. No contractures.   Skin color, temperature, and hair growth are WNL. No peripheral edema or cyanosis. No masses, redness, swelling, asymmetry, or associated skin lesions. No contractures.                 Functional ROM       Pain restricted ROM           Pain restricted ROM                         Muscle Tone/Strength       Functionally intact. No obvious neuro-muscular anomalies detected.   Functionally intact. No obvious neuro-muscular anomalies detected.                 Sensory (Neurological)        Musculoskeletal pain pattern           Musculoskeletal pain pattern                         Palpation       No palpable anomalies  No palpable anomalies                             Maneuver Shoulder abduction (deltoid/supraspinatus, axillary/supra scapular n,, C5) Elbow flexion (biceps brachial, musculoskeletal n, C5-6) Elbow extension (triceps, radial n, C7) Finger abduction (interossei, ulnar n, T1)       Shoulder abduction (deltoid/supraspinatus, axillary/supra scapular n,, C5) Elbow flexion (biceps brachial, musculoskeletal n, C5-6) Elbow extension (triceps, radial n, C7) Wrist extensors (C6) Finger extensors (C8) Finger abduction (interossei, ulnar n, T1)                     Provocative Test       Phalen's test: deferred Tinel's test: deferred Apley's scratch test (touch opposite shoulder):  Action 1 (Across chest): deferred Action 2 (Overhead): deferred Action 3 (LB reach): deferred   Phalen's test: deferred Tinel's test: deferred Apley's scratch test (touch opposite shoulder):  Action 1 (Across chest): deferred Action 2 (Overhead): deferred Action 3 (LB reach): deferred                       Level   Myotome   Dermatome   Sclerotome   ROM  C5   Elbow flexion   Lateral upper arm          C6   Wrist extension   Thumb and index          C7   Elbow extension   Middle finger          C8   Finger extension   Ring and pinky finger          T1   Finger abduction   Medial elbow and axilla         Assessment   Diagnosis Status  1. Chronic cervical pain   2. Lumbar facet arthropathy   3. Chronic pain syndrome   4. Lumbar spondylosis   5. History of kyphoplasty    Persistent Persistent Persistent   Updated Problems: No problems updated.  Plan of Care  Problem-specific:  Assessment and Plan    Multilevel cervical facet joint degeneration Degeneration at C4-C5, C5-C6, and C6-C7 causing nerve compression and arm tingling. Cervical facet syndrome is a  condition that causes pain in the neck and upper back due to inflammation of the facet joints.  Her cervical MRI showed degeneration of facet disease most pronounced on C4-C5, C5-C6, and C6-C7. - Schedule cervical facet joint injection within two to three weeks. - Administer IV Versed  for sedation during the procedure. - Continue aspirin  81 mg.  Lumbar spondylosis with anterolisthesis Degenerative changes and slight forward displacement of lumbar discs causing back pain. - Plan for lumbar facet injection after cervical injection.  Her recent lumbar MRI also showed degenerative changes with anterolisthesis, where vertebrae slips forward due to age-related wear and tear on the spines discs and joints.  Future consideration for lumbar facet block.   Plan: (Clinic): (B) C-FCT ([facility]) # 1 with Dr. Marcelino      Ms. Tarry Blayney has a current medication list which includes the following long-term medication(s): amlodipine , atorvastatin , furosemide, hydralazine , lisinopril , and omeprazole.  Pharmacotherapy (Medications Ordered): No orders of the defined types were placed in this encounter.  Orders:  Orders Placed This Encounter  Procedures   CERVICAL FACET (MEDIAL BRANCH NERVE BLOCK)     Standing Status:   Future    Expiration Date:   04/19/2024  Scheduling Instructions:     Procedure: Cervical facet Block     Type: Medial Branch Block     Side: Bilateral     Purpose: Diagnostic/Therapeutic     Level(s): C4-5, C5-6, and C6-7 Facet joints (C4, C5, C6, and C7 Medial Branch Nerves)     Sedation: Patient's choice (IV Versed )     Timeframe: As soon as schedule allows.    Where will this procedure be performed?:   [facility]      Return in about 2 weeks (around 02/01/2024) for (Clinic): (B) C-FCT ([facility]) # 1 with Dr. Marcelino .    Recent Visits Date Type Provider Dept  01/02/24 Office Visit Jasemine Nawaz K, NP [facility]  Showing recent visits within past 90 days and  meeting all other requirements Today's Visits Date Type Provider Dept  01/18/24 Office Visit Laval Cafaro K, NP [facility]  Showing today's visits and meeting all other requirements Future Appointments Date Type Provider Dept  01/31/24 Appointment Marcelino Nurse, MD [facility]  03/27/24 Appointment Kyran Whittier K, NP [facility]  Showing future appointments within next 90 days and meeting all other requirements  I discussed the assessment and treatment plan with the patient. The patient was provided an opportunity to ask questions and all were answered. The patient agreed with the plan and demonstrated an understanding of the instructions.  Patient advised to call back or seek an in-person evaluation if the symptoms or condition worsens.  I personally spent a total of 20 minutes in the care of the patient today including preparing to see the patient, getting/reviewing separately obtained history, performing a medically appropriate exam/evaluation, counseling and educating, placing orders, referring and communicating with other health care professionals, documenting clinical information in the EHR, independently interpreting results, communicating results, and coordinating care.   Note by: Tracee Mccreery K Corley Maffeo, NP (TTS and AI technology used. I apologize for any typographical errors that were not detected and corrected.) Date: 01/18/2024; Time: 3:48 PM

## 2024-01-18 NOTE — Patient Instructions (Addendum)
 Do not eat or drink for 6 hours Bring a driver Take blood pressure medication the morning of the procedure. __________ Facet Blocks Patient Information  Description: The facets are joints in the spine between the vertebrae.  Like any joints in the body, facets can become irritated and painful.  Arthritis can also effect the facets.  By injecting steroids and local anesthetic in and around these joints, we can temporarily block the nerve supply to them.  Steroids act directly on irritated nerves and tissues to reduce selling and inflammation which often leads to decreased pain.  Facet blocks may be done anywhere along the spine from the neck to the low back depending upon the location of your pain.   After numbing the skin with local anesthetic (like Novocaine), a small needle is passed onto the facet joints under x-ray guidance.  You may experience a sensation of pressure while this is being done.  The entire block usually lasts about 15-25 minutes.   Conditions which may be treated by facet blocks:  Low back/buttock pain Neck/shoulder pain Certain types of headaches  Preparation for the injection:  Do not eat any solid food or dairy products within 8 hours of your appointment. You may drink clear liquid up to 3 hours before appointment.  Clear liquids include water, black coffee, juice or soda.  No milk or cream please. You may take your regular medication, including pain medications, with a sip of water before your appointment.  Diabetics should hold regular insulin  (if taken separately) and take 1/2 normal NPH dose the morning of the procedure.  Carry some sugar containing items with you to your appointment. A driver must accompany you and be prepared to drive you home after your procedure. Bring all your current medications with you. An IV may be inserted and sedation may be given at the discretion of the physician. A blood pressure cuff, EKG and other monitors will often be applied  during the procedure.  Some patients may need to have extra oxygen administered for a short period. You will be asked to provide medical information, including your allergies and medications, prior to the procedure.  We must know immediately if you are taking blood thinners (like Coumadin/Warfarin) or if you are allergic to IV iodine contrast (dye).  We must know if you could possible be pregnant.  Possible side-effects:  Bleeding from needle site Infection (rare, may require surgery) Nerve injury (rare) Numbness & tingling (temporary) Difficulty urinating (rare, temporary) Spinal headache (a headache worse with upright posture) Light-headedness (temporary) Pain at injection site (serveral days) Decreased blood pressure (rare, temporary) Weakness in arm/leg (temporary) Pressure sensation in back/neck (temporary)   Call if you experience:  Fever/chills associated with headache or increased back/neck pain Headache worsened by an upright position New onset, weakness or numbness of an extremity below the injection site Hives or difficulty breathing (go to the emergency room) Inflammation or drainage at the injection site(s) Severe back/neck pain greater than usual New symptoms which are concerning to you  Please note:  Although the local anesthetic injected can often make your back or neck feel good for several hours after the injection, the pain will likely return. It takes 3-7 days for steroids to work.  You may not notice any pain relief for at least one week.  If effective, we will often do a series of 2-3 injections spaced 3-6 weeks apart to maximally decrease your pain.  After the initial series, you may be a candidate for a  more permanent nerve block of the facets.  If you have any questions, please call #[phone] [facility]____________________________________________________________    Preparing for your procedure  Appointments: If  you think you may not be able to keep your appointment, call 24-48 hours in advance to cancel. We need time to make it available to others.  Procedure visits are for procedures only. During your procedure appointment there will be: NO Prescription Refills*. NO medication changes or discussions*. NO discussion of disability issues*. NO unrelated pain problem evaluations*. NO evaluations to order other pain procedures*. *These will be addressed at a separate and distinct evaluation encounter on the provider's evaluation schedule and not during procedure days.  Instructions: Food intake: Avoid eating anything solid for at least 8 hours prior to your procedure. Clear liquid intake: You may take clear liquids such as water up to 2 hours prior to your procedure. (No carbonated drinks. No soda.) Transportation: Unless otherwise stated by your physician, bring a driver. (Driver cannot be a Market researcher, Pharmacist, community, or any other form of public transportation.) Morning Medicines: Except for blood thinners, take all of your other morning medications with a sip of water. Make sure to take your heart and blood pressure medicines. If your blood pressure's lower number is above 100, the case will be rescheduled. Blood thinners: Make sure to stop your blood thinners as instructed.  If you take a blood thinner, but were not instructed to stop it, call our office [phone] and ask to talk to a nurse. Not stopping a blood thinner prior to certain procedures could lead to serious complications. Diabetics on insulin : Notify the staff so that you can be scheduled 1st case in the morning. If your diabetes requires high dose insulin , take only  of your normal insulin  dose the morning of the procedure and notify the staff that you have done so. Preventing infections: Shower with an antibacterial soap the morning of your procedure.  Build-up your immune system: Take 1000 mg of Vitamin C with every meal (3 times a day) the day prior  to your procedure. Antibiotics: Inform the nursing staff if you are taking any antibiotics or if you have any conditions that may require antibiotics prior to procedures. (Example: recent joint implants)   Pregnancy: If you are pregnant make sure to notify the nursing staff. Not doing so may result in injury to the fetus, including death.  Sickness: If you have a cold, fever, or any active infections, call and cancel or reschedule your procedure. Receiving steroids while having an infection may result in complications. Arrival: You must be in the facility at least 30 minutes prior to your scheduled procedure. Tardiness: Your scheduled time is also the cutoff time. If you do not arrive at least 15 minutes prior to your procedure, you will be rescheduled.  Children: Do not bring any children with you. Make arrangements to keep them home. Dress appropriately: There is always a possibility that your clothing may get soiled. Avoid long dresses. Valuables: Do not bring any jewelry or valuables.  Reasons to call and reschedule or cancel your procedure: (Following these recommendations will minimize the risk of a serious complication.) Surgeries: Avoid having procedures within 2 weeks of any surgery. (Avoid for 2 weeks before or after any surgery). Flu Shots: Avoid having procedures within 2 weeks of a flu shots or . (Avoid for 2 weeks before or after immunizations). Barium: Avoid having a procedure within 7-10 days after having had a radiological  study involving the use of radiological contrast. (Myelograms, Barium swallow or enema study). Heart attacks: Avoid any elective procedures or surgeries for the initial 6 months after a Myocardial Infarction (Heart Attack). Blood thinners: It is imperative that you stop these medications before procedures. Let us  know if you if you take any blood thinner.  Infection: Avoid procedures during or within two weeks of an infection (including chest colds or  gastrointestinal problems). Symptoms associated with infections include: Localized redness, fever, chills, night sweats or profuse sweating, burning sensation when voiding, cough, congestion, stuffiness, runny nose, sore throat, diarrhea, nausea, vomiting, cold or Flu symptoms, recent or current infections. It is specially important if the infection is over the area that we intend to treat. Heart and lung problems: Symptoms that may suggest an active cardiopulmonary problem include: cough, chest pain, breathing difficulties or shortness of breath, dizziness, ankle swelling, uncontrolled high or unusually low blood pressure, and/or palpitations. If you are experiencing any of these symptoms, cancel your procedure and contact your primary care physician for an evaluation.  Remember:  Regular Business hours are:  Monday to Thursday 8:00 AM to 4:00 PM  Provider's Schedule: Eric Como, MD:  Procedure days: Tuesday and Thursday 7:30 AM to 4:00 PM  Wallie Sherry, MD:  Procedure days: Monday and Wednesday 7:30 AM to 4:00 PM Last  Updated: 03/07/2023 ______________________________________________________________________     ______________________________________________________________________    Blood Thinners  IMPORTANT NOTICE:  If you take any of these, make sure to notify the nursing staff.  Failure to do so may result in serious injury.  Recommended time intervals to stop and restart blood-thinners, before & after invasive procedures  Generic Name Brand Name Pre-procedure: Stop medication for this amount of time before your procedure: Post-procedure: Wait this amount of time after the procedure before restarting your medication:  Abciximab Reopro 15 days 2 hrs  Alteplase Activase 10 days 10 days  Anagrelide Agrylin    Apixaban Eliquis 3 days 6 hrs  Cilostazol Pletal 3 days 5 hrs  Clopidogrel Plavix 7-10 days 2 hrs  Dabigatran Pradaxa 5 days 6 hrs  Dalteparin Fragmin 24 hours 4  hrs  Dipyridamole Aggrenox 11days 2 hrs  Edoxaban Lixiana; Savaysa 3 days 2 hrs  Enoxaparin   Lovenox  24 hours 4 hrs  Eptifibatide Integrillin 8 hours 2 hrs  Fondaparinux  Arixtra 72 hours 12 hrs  Hydroxychloroquine Plaquenil 11 days   Prasugrel Effient 7-10 days 6 hrs  Reteplase Retavase 10 days 10 days  Rivaroxaban Xarelto 3 days 6 hrs  Ticagrelor Brilinta 5-7 days 6 hrs  Ticlopidine Ticlid 10-14 days 2 hrs  Tinzaparin Innohep 24 hours 4 hrs  Tirofiban Aggrastat 8 hours 2 hrs  Warfarin Coumadin 5 days 2 hrs   Other medications with blood-thinning effects  NOTE: Consider stopping these if you have prolonged bleeding despite not taking any of the above blood thinners. Otherwise ask your provider and this will be decided on a case-by-case basis.  Product indications Generic (Brand) names Note  Cholesterol Lipitor Stop 4 days before procedure  Blood thinner (injectable) Heparin (LMW or LMWH Heparin) Stop 24 hours before procedure  Cancer Ibrutinib (Imbruvica) Stop 7 days before procedure  Malaria/Rheumatoid Hydroxychloroquine (Plaquenil) Stop 11 days before procedure  Thrombolytics  10 days before or after procedures   Over-the-counter (OTC) Products with blood-thinning effects  NOTE: Consider stopping these if you have prolonged bleeding despite not taking any of the above blood thinners. Otherwise ask your provider and this will be decided on a  case-by-case basis.  Product Common names Stop Time  Aspirin  > 325 mg Goody Powders, Excedrin, etc. 11 days  Aspirin  <= 81 mg  7 days  Fish oil  4 days  Garlic supplements  7 days  Ginkgo biloba  36 hours  Ginseng  24 hours  NSAIDs Ibuprofen, Naprosyn, etc. 3 days  Vitamin E  4 days   ______________________________________________________________________

## 2024-01-23 ENCOUNTER — Telehealth: Payer: Self-pay | Admitting: Nurse Practitioner

## 2024-01-23 NOTE — Telephone Encounter (Signed)
 Called patient. Script sent to [facility] and not mail order pharmacy. Patient states she will pick up at [facility].

## 2024-01-23 NOTE — Telephone Encounter (Signed)
 PT stated her optumRx is for 1 time a day and it supposed to be 4 times a day.

## 2024-01-31 ENCOUNTER — Ambulatory Visit
Admission: RE | Admit: 2024-01-31 | Discharge: 2024-01-31 | Disposition: A | Discharge status: Home / Self Care | Source: Ambulatory Visit | Attending: Student in an Organized Health Care Education/Training Program | Admitting: Student in an Organized Health Care Education/Training Program

## 2024-01-31 ENCOUNTER — Other Ambulatory Visit: Payer: Self-pay | Admitting: Student in an Organized Health Care Education/Training Program

## 2024-01-31 ENCOUNTER — Encounter: Payer: Self-pay | Admitting: Student in an Organized Health Care Education/Training Program

## 2024-01-31 ENCOUNTER — Ambulatory Visit (HOSPITAL_BASED_OUTPATIENT_CLINIC_OR_DEPARTMENT_OTHER): Admitting: Student in an Organized Health Care Education/Training Program

## 2024-01-31 VITALS — BP 152/92 | HR 61 | Temp 98.0°F | Resp 15 | Ht 60.0 in | Wt 143.0 lb

## 2024-01-31 DIAGNOSIS — M542 Cervicalgia: Secondary | ICD-10-CM

## 2024-01-31 DIAGNOSIS — G8929 Other chronic pain: Secondary | ICD-10-CM

## 2024-01-31 DIAGNOSIS — M47812 Spondylosis without myelopathy or radiculopathy, cervical region: Secondary | ICD-10-CM

## 2024-01-31 DIAGNOSIS — G894 Chronic pain syndrome: Secondary | ICD-10-CM

## 2024-01-31 MED ORDER — DIAZEPAM 5 MG PO TABS
ORAL_TABLET | ORAL | Status: AC
  Filled 2024-01-31: qty 1

## 2024-01-31 MED ORDER — DEXAMETHASONE SOD PHOSPHATE PF 10 MG/ML IJ SOLN
20.0000 mg | Freq: Once | INTRAMUSCULAR | Status: AC
  Administered 2024-01-31: 20 mg

## 2024-01-31 MED ORDER — LIDOCAINE HCL 2 % IJ SOLN
20.0000 mL | Freq: Once | INTRAMUSCULAR | Status: AC
  Administered 2024-01-31: 400 mg

## 2024-01-31 MED ORDER — ROPIVACAINE HCL 2 MG/ML IJ SOLN
INTRAMUSCULAR | Status: AC
  Filled 2024-01-31: qty 20

## 2024-01-31 MED ORDER — LIDOCAINE HCL 2 % IJ SOLN
INTRAMUSCULAR | Status: AC
  Filled 2024-01-31: qty 20

## 2024-01-31 MED ORDER — DIAZEPAM 5 MG PO TABS
5.0000 mg | ORAL_TABLET | ORAL | Status: AC
  Administered 2024-01-31: 5 mg via ORAL

## 2024-01-31 MED ORDER — ROPIVACAINE HCL 2 MG/ML IJ SOLN
18.0000 mL | Freq: Once | INTRAMUSCULAR | Status: AC
  Administered 2024-01-31: 18 mL via PERINEURAL

## 2024-01-31 NOTE — Progress Notes (Signed)
 Safety precautions to be maintained throughout the outpatient stay will include: orient to surroundings, keep bed in low position, maintain call bell within reach at all times, provide assistance with transfer out of bed and ambulation.

## 2024-01-31 NOTE — Patient Instructions (Signed)
 Facet Blocks Patient Information  Description: The facets are joints in the spine between the vertebrae.  Like any joints in the body, facets can become irritated and painful.  Arthritis can also effect the facets.  By injecting steroids and local anesthetic in and around these joints, we can temporarily block the nerve supply to them.  Steroids act directly on irritated nerves and tissues to reduce selling and inflammation which often leads to decreased pain.  Facet blocks may be done anywhere along the spine from the neck to the low back depending upon the location of your pain.   After numbing the skin with local anesthetic (like Novocaine), a small needle is passed onto the facet joints under x-ray guidance.  You may experience a sensation of pressure while this is being done.  The entire block usually lasts about 15-25 minutes.   Conditions which may be treated by facet blocks:  Low back/buttock pain Neck/shoulder pain Certain types of headaches  Preparation for the injection:  Do not eat any solid food or dairy products within 8 hours of your appointment. You may drink clear liquid up to 3 hours before appointment.  Clear liquids include water, black coffee, juice or soda.  No milk or cream please. You may take your regular medication, including pain medications, with a sip of water before your appointment.  Diabetics should hold regular insulin  (if taken separately) and take 1/2 normal NPH dose the morning of the procedure.  Carry some sugar containing items with you to your appointment. A driver must accompany you and be prepared to drive you home after your procedure. Bring all your current medications with you. An IV may be inserted and sedation may be given at the discretion of the physician. A blood pressure cuff, EKG and other monitors will often be applied during the procedure.  Some patients may need to have extra oxygen administered for a short period. You will be asked to  provide medical information, including your allergies and medications, prior to the procedure.  We must know immediately if you are taking blood thinners (like Coumadin/Warfarin) or if you are allergic to IV iodine contrast (dye).  We must know if you could possible be pregnant.  Possible side-effects:  Bleeding from needle site Infection (rare, may require surgery) Nerve injury (rare) Numbness & tingling (temporary) Difficulty urinating (rare, temporary) Spinal headache (a headache worse with upright posture) Light-headedness (temporary) Pain at injection site (serveral days) Decreased blood pressure (rare, temporary) Weakness in arm/leg (temporary) Pressure sensation in back/neck (temporary)   Call if you experience:  Fever/chills associated with headache or increased back/neck pain Headache worsened by an upright position New onset, weakness or numbness of an extremity below the injection site Hives or difficulty breathing (go to the emergency room) Inflammation or drainage at the injection site(s) Severe back/neck pain greater than usual New symptoms which are concerning to you  Please note:  Although the local anesthetic injected can often make your back or neck feel good for several hours after the injection, the pain will likely return. It takes 3-7 days for steroids to work.  You may not notice any pain relief for at least one week.  If effective, we will often do a series of 2-3 injections spaced 3-6 weeks apart to maximally decrease your pain.  After the initial series, you may be a candidate for a more permanent nerve block of the facets.  If you have any questions, please call #[phone] [facility]  [facility] ClinicF[facility] Blocks Patient Information  Description: The facets are joints in the spine between the vertebrae.  Like any joints in the body, facets can become irritated and painful.  Arthritis can also effect the facets.  By injecting steroids  and local anesthetic in and around these joints, we can temporarily block the nerve supply to them.  Steroids act directly on irritated nerves and tissues to reduce selling and inflammation which often leads to decreased pain.  Facet blocks may be done anywhere along the spine from the neck to the low back depending upon the location of your pain.   After numbing the skin with local anesthetic (like Novocaine), a small needle is passed onto the facet joints under x-ray guidance.  You may experience a sensation of pressure while this is being done.  The entire block usually lasts about 15-25 minutes.   Conditions which may be treated by facet blocks:  Low back/buttock pain Neck/shoulder pain Certain types of headaches  Preparation for the injection:  Do not eat any solid food or dairy products within 8 hours of your appointment. You may drink clear liquid up to 3 hours before appointment.  Clear liquids include water, black coffee, juice or soda.  No milk or cream please. You may take your regular medication, including pain medications, with a sip of water before your appointment.  Diabetics should hold regular insulin  (if taken separately) and take 1/2 normal NPH dose the morning of the procedure.  Carry some sugar containing items with you to your appointment. A driver must accompany you and be prepared to drive you home after your procedure. Bring all your current medications with you. An IV may be inserted and sedation may be given at the discretion of the physician. A blood pressure cuff, EKG and other monitors will often be applied during the procedure.  Some patients may need to have extra oxygen administered for a short period. You will be asked to provide medical information, including your allergies and medications, prior to the procedure.  We must know immediately if you are taking blood thinners (like Coumadin/Warfarin) or if you are allergic to IV iodine contrast (dye).  We must know if  you could possible be pregnant.  Possible side-effects:  Bleeding from needle site Infection (rare, may require surgery) Nerve injury (rare) Numbness & tingling (temporary) Difficulty urinating (rare, temporary) Spinal headache (a headache worse with upright posture) Light-headedness (temporary) Pain at injection site (serveral days) Decreased blood pressure (rare, temporary) Weakness in arm/leg (temporary) Pressure sensation in back/neck (temporary)   Call if you experience:  Fever/chills associated with headache or increased back/neck pain Headache worsened by an upright position New onset, weakness or numbness of an extremity below the injection site Hives or difficulty breathing (go to the emergency room) Inflammation or drainage at the injection site(s) Severe back/neck pain greater than usual New symptoms which are concerning to you  Please note:  Although the local anesthetic injected can often make your back or neck feel good for several hours after the injection, the pain will likely return. It takes 3-7 days for steroids to work.  You may not notice any pain relief for at least one week.  If effective, we will often do a series of 2-3 injections spaced 3-6 weeks apart to maximally decrease your pain.  After the initial series, you may be a candidate for a more permanent nerve block of the facets.  If you have any questions, please call #[phone] [facility]

## 2024-01-31 NOTE — Progress Notes (Signed)
 PROVIDER NOTE: Interpretation of information contained herein should be left to medically-trained personnel. Specific patient instructions are provided elsewhere under Patient Instructions section of medical record. This document was created in part using STT-dictation technology, any transcriptional errors that may result from this process are unintentional.  Patient: Jean Stout Type: Established DOB: Mar 19, 1941 MRN: [id number] PCP: Sherial Bail, MD  Service: Procedure DOS: 01/31/2024 Setting: Ambulatory Location: Ambulatory outpatient facility Delivery: Face-to-face Provider: Wallie Sherry, MD Specialty: Interventional Pain Management Specialty designation: 09 Location: Outpatient facility Ref. Prov.: Sherial Bail, MD       Interventional Therapy   Procedure: Cervical Facet Medial Branch Block(s) #1  Laterality: Bilateral  Level: C4, C5, and C6 Medial Branch Level(s). Injecting these levels blocks the C4-5 and C5-6 cervical facet joints.  Imaging: Fluoroscopic guidance Anesthesia: Local anesthesia (1-2% Lidocaine ) Sedation: Minimal Sedation                       DOS: 01/31/2024  Performed by: Wallie Sherry, MD  Purpose: Diagnostic/Therapeutic Indications: Cervicalgia (cervical spine axial pain) severe enough to impact quality of life or function. 1. Chronic cervical pain   2. Cervical facet joint syndrome    NAS-11 Pain score:   Pre-procedure: 5 /10   Post-procedure: 5 /10     Position / Prep / Materials:  Position: Prone. Head in cradle. C-spine slightly flexed. Prep solution: ChloraPrep (2% chlorhexidine  gluconate and 70% isopropyl alcohol) Prep Area: Posterior Cervico-thoracic Region. From occipital ridge to tip of scapula, and from shoulder to shoulder. Entire posterior and lateral neck surface. Materials:  Tray: Block Needle(s):  Type: Spinal  Gauge (G): 22  Length: 3.5-in  Qty:  4     H&P (Pre-op Assessment):  Jean Stout is a 83 y.o. (year old),  female patient, seen today for interventional treatment. 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 Jean Stout has a current medication list which includes the following prescription(s): amlodipine , aspirin  ec, atorvastatin , cholecalciferol , diclofenac sodium, duloxetine , duloxetine , ferrous sulfate, furosemide, glucosamine-chondroitin, hydralazine , [START ON 02/04/2024] hydrocodone -acetaminophen , [START ON 03/05/2024] hydrocodone -acetaminophen , lisinopril , methocarbamol , multivitamin with minerals, omeprazole, tramadol , and turmeric. Her primarily concern today is the Neck Pain (neck)  Initial Vital Signs:  Pulse/HCG Rate: 61ECG Heart Rate: 63 Temp: 98 F (36.7 C) Resp: 18 BP: (!) 165/72 SpO2: 100 %  BMI: Estimated body mass index is 27.93 kg/m as calculated from the following:   Height as of this encounter: 5' (1.524 m).   Weight as of this encounter: 143 lb (64.9 kg).  Risk Assessment: Allergies: Reviewed. She is allergic to gadolinium derivatives, hm lidocaine  patch [lidocaine ], iodinated contrast media, lotrel [amlodipine  besy-benazepril hcl], and bextra [valdecoxib].  Allergy Precautions: None required Coagulopathies: Reviewed. None identified.  Blood-thinner therapy: None at this time Active Infection(s): Reviewed. None identified. Jean Stout is afebrile  Site Confirmation: Jean Stout was asked to confirm the procedure and laterality before marking the site Procedure checklist: Completed Consent: Before the procedure and under the influence of no sedative(s), amnesic(s), or anxiolytics,  the patient was informed of the treatment options, risks and possible complications. To fulfill our ethical and legal obligations, as recommended by the American Medical Association's Code of Ethics, I have informed  the patient of my clinical impression; the nature and purpose of the treatment or procedure; the risks, benefits, and possible complications of the intervention; the alternatives, including doing nothing; the risk(s) and benefit(s) of the alternative treatment(s) or procedure(s); and the risk(s) and benefit(s) of doing nothing. The patient was provided information about the general risks and possible complications associated with the procedure. These may include, but are not limited to: failure to achieve desired goals, infection, bleeding, organ or nerve damage, allergic reactions, paralysis, and death. In addition, the patient was informed of those risks and complications associated to Spine-related procedures, such as failure to decrease pain; infection (i.e.: Meningitis, epidural or intraspinal abscess); bleeding (i.e.: epidural hematoma, subarachnoid hemorrhage, or any other type of intraspinal or peri-dural bleeding); organ or nerve damage (i.e.: Any type of peripheral nerve, nerve root, or spinal cord injury) with subsequent damage to sensory, motor, and/or autonomic systems, resulting in permanent pain, numbness, and/or weakness of one or several areas of the body; allergic reactions; (i.e.: anaphylactic reaction); and/or death. Furthermore, the patient was informed of those risks and complications associated with the medications. These include, but are not limited to: allergic reactions (i.e.: anaphylactic or anaphylactoid reaction(s)); adrenal axis suppression; blood sugar elevation that in diabetics may result in ketoacidosis or comma; water retention that in patients with history of congestive heart failure may result in shortness of breath, pulmonary edema, and decompensation with  resultant heart failure; weight gain; swelling or edema; medication-induced neural toxicity; particulate matter embolism and blood vessel occlusion with resultant organ, and/or nervous system infarction; and/or aseptic necrosis of one or more joints. Finally, the patient was informed that Medicine is not an exact science; therefore, there is also the possibility of unforeseen or unpredictable risks and/or possible complications that may result in a catastrophic outcome. The patient indicated having understood very clearly. We have given the patient no guarantees and we have made no promises. Enough time was given to the patient to ask questions, all of which were answered to the patient's satisfaction. Jean Stout has indicated that she wanted to continue with the procedure. Attestation: I, the ordering provider, attest that I have discussed with the patient the benefits, risks, side-effects, alternatives, likelihood of achieving goals, and potential problems during recovery for the procedure that I have provided informed consent. Date  Time: 01/31/2024 11:31 AM  Pre-Procedure Preparation:  Monitoring: As per clinic protocol. Respiration, ETCO2, SpO2, BP, heart rate and rhythm monitor placed and checked for adequate function Safety Precautions: Patient was assessed for positional comfort and pressure points before starting the procedure. Time-out: I initiated and conducted the Time-out before starting the procedure, as per protocol. The patient was asked to participate by confirming the accuracy of the Time Out information. Verification of the correct person, site, and procedure were performed and confirmed by me, the nursing staff, and the patient. Time-out conducted as per Joint Commission's Universal Protocol (UP.01.01.01). Time: 1149 Start Time: 1150 hrs.  Description/Narrative of Procedure:          Laterality: See above. Targeted Levels: See above.  Rationale (medical necessity): procedure  needed and proper for the diagnosis and/or treatment of the patient's medical symptoms and needs. Procedural Technique Safety Precautions: Aspiration looking for blood return was conducted prior to all injections. At no point did we inject any substances, as a needle was being advanced. No attempts were made at seeking any paresthesias. Safe injection practices and needle disposal techniques used. Medications properly checked for expiration dates. SDV (single dose  vial) medications used. Description of the Procedure: Protocol guidelines were followed. The patient was assisted into a comfortable position. The target area was identified and the area prepped in the usual manner. Skin & deeper tissues infiltrated with local anesthetic. Appropriate amount of time allowed to pass for local anesthetics to take effect. The procedure needles were then advanced to the target area. Proper needle placement secured. Negative aspiration confirmed. Solution injected in intermittent fashion, asking for systemic symptoms every 0.5cc of injectate. The needles were then removed and the area cleansed, making sure to leave some of the prepping solution back to take advantage of its long term bactericidal properties.  Technical description of process:  C4 Medial Branch Nerve Block (MBB): The target area for the C4 dorsal medial articular branch is the lateral concave waist of the articular pillar of C4. Under fluoroscopic guidance, a Quincke needle was inserted until contact was made with os over the postero-lateral aspect of the articular pillar of C4 (target area). After negative aspiration for blood, 2mL of the nerve block solution was injected without difficulty or complication. The needle was removed intact. C5 Medial Branch Nerve Block (MBB): The target area for the C5 dorsal medial articular branch is the lateral concave waist of the articular pillar of C5. Under fluoroscopic guidance, a Quincke needle was inserted until  contact was made with os over the postero-lateral aspect of the articular pillar of C5 (target area). After negative aspiration for blood, 2mL of the nerve block solution was injected without difficulty or complication. The needle was removed intact. C6 Medial Branch Nerve Block (MBB): The target area for the C6 dorsal medial articular branch is the lateral concave waist of the articular pillar of C6. Under fluoroscopic guidance, a Quincke needle was inserted until contact was made with os over the postero-lateral aspect of the articular pillar of C6 (target area). After negative aspiration for blood, 2mL of the nerve block solution was injected without difficulty or complication. The needle was removed intact.   Once the entire procedure was completed, the treated area was cleaned, making sure to leave some of the prepping solution back to take advantage of its long term bactericidal properties.  Anatomy Reference Guide:      Facet Joint Innervation  C1-2 Third occipital Nerve (TON)  C2-3 TON, C3  Medial Branch  C3-4 C3, C4                     C4-5 C4, C5                     C5-6 C5, C6                     C6-7 C6, C7                     C7-T1 C7, C8                      Cervical Facet Pain Pattern overlap:   Vitals:   01/31/24 1135 01/31/24 1148 01/31/24 1153 01/31/24 1158  BP: (!) 165/72 (!) 179/83 (!) 182/83 (!) 152/92  Pulse: 61     Resp:  18 18 15   Temp: 98 F (36.7 C)     SpO2: 100% 100% 100% 100%  Weight: 143 lb (64.9 kg)     Height: 5' (1.524 m)        Start Time: 1150 hrs. End Time: 1150  hrs.  Imaging Guidance (Spinal):         Type of Imaging Technique: Fluoroscopy Guidance (Spinal) Indication(s): Fluoroscopy guidance for needle placement to enhance accuracy in procedures requiring precise needle localization for targeted delivery of medication in or near specific anatomical locations not easily accessible without such real-time imaging assistance. Exposure  Time: Please see nurses notes. Contrast: None used. Fluoroscopic Guidance: I was personally present during the use of fluoroscopy. Tunnel Vision Technique used to obtain the best possible view of the target area. Parallax error corrected before commencing the procedure. Direction-depth-direction technique used to introduce the needle under continuous pulsed fluoroscopy. Once target was reached, antero-posterior, oblique, and lateral fluoroscopic projection used confirm needle placement in all planes. Images permanently stored in EMR. Interpretation: No contrast injected. I personally interpreted the imaging intraoperatively. Adequate needle placement confirmed in multiple planes. Permanent images saved into the patient's record.  Post-operative Assessment:  Post-procedure Vital Signs:  Pulse/HCG Rate: 6162 Temp: 98 F (36.7 C) Resp: 15 BP: (!) 152/92 SpO2: 100 %  EBL: None  Complications: No immediate post-treatment complications observed by team, or reported by patient.  Note: The patient tolerated the entire procedure well. A repeat set of vitals were taken after the procedure and the patient was kept under observation following institutional policy, for this type of procedure. Post-procedural neurological assessment was performed, showing return to baseline, prior to discharge. The patient was provided with post-procedure discharge instructions, including a section on how to identify potential problems. Should any problems arise concerning this procedure, the patient was given instructions to immediately contact us , at any time, without hesitation. In any case, we plan to contact the patient by telephone for a follow-up status report regarding this interventional procedure.  Comments:  No additional relevant information.  Plan of Care (POC)  Orders:  No orders of the defined types were placed in this encounter.   Medications ordered for procedure: Meds ordered this encounter   Medications   lidocaine  (XYLOCAINE ) 2 % (with pres) injection 400 mg   diazepam  (VALIUM ) tablet 5 mg    Make sure Flumazenil is available in the pyxis when using this medication. If oversedation occurs, administer 0.2 mg IV over 15 sec. If after 45 sec no response, administer 0.2 mg again over 1 min; may repeat at 1 min intervals; not to exceed 4 doses (1 mg)   ropivacaine  (PF) 2 mg/mL (0.2%) (NAROPIN ) injection 18 mL   dexamethasone  (DECADRON ) injection 20 mg   Medications administered: We administered lidocaine , diazepam , ropivacaine  (PF) 2 mg/mL (0.2%), and dexamethasone .  See the medical record for exact dosing, route, and time of administration.    C4-6 MBNB 01/31/24      Follow-up plan:   Return for Keep sch. appt.     Recent Visits Date Type Provider Dept  01/18/24 Office Visit Patel, Seema K, NP [facility]  01/02/24 Office Visit Patel, Seema K, NP [facility]  Showing recent visits within past 90 days and meeting all other requirements Today's Visits Date Type Provider Dept  01/31/24 Procedure visit Marcelino Nurse, MD [facility]  Showing today's visits and meeting all other requirements Future Appointments Date Type Provider Dept  03/27/24 Appointment Patel, Seema K, NP [facility]  Showing future appointments within next 90 days and meeting all other requirements   Disposition: Discharge home  Discharge (Date  Time): 01/31/2024; 1200 hrs.   Primary Care Physician: Sherial Bail, MD Location: [facility] Note by: Nurse  Marcelino, MD (TTS technology used. I apologize for any typographical errors that were not detected and corrected.) Date: 01/31/2024; Time: 12:00 PM  Disclaimer:  Medicine is not an visual merchandiser. The only guarantee in medicine is that nothing is guaranteed. It is important to note that the decision to proceed with this intervention was based on the information collected from the  patient. The Data and conclusions were drawn from the patient's questionnaire, the interview, and the physical examination. Because the information was provided in large part by the patient, it cannot be guaranteed that it has not been purposely or unconsciously manipulated. Every effort has been made to obtain as much relevant data as possible for this evaluation. It is important to note that the conclusions that lead to this procedure are derived in large part from the available data. Always take into account that the treatment will also be dependent on availability of resources and existing treatment guidelines, considered by other Pain Management Practitioners as being common knowledge and practice, at the time of the intervention. For Medico-Legal purposes, it is also important to point out that variation in procedural techniques and pharmacological choices are the acceptable norm. The indications, contraindications, technique, and results of the above procedure should only be interpreted and judged by a Board-Certified Interventional Pain Specialist with extensive familiarity and expertise in the same exact procedure and technique.

## 2024-02-01 NOTE — Telephone Encounter (Signed)
 Post procedure follow up.  Patient states she is feeling great.

## 2024-02-05 ENCOUNTER — Telehealth: Payer: Self-pay | Admitting: Nurse Practitioner

## 2024-02-05 ENCOUNTER — Telehealth: Payer: Self-pay

## 2024-02-05 ENCOUNTER — Other Ambulatory Visit: Payer: Self-pay | Admitting: *Deleted

## 2024-02-05 DIAGNOSIS — G894 Chronic pain syndrome: Secondary | ICD-10-CM

## 2024-02-05 DIAGNOSIS — Z79899 Other long term (current) drug therapy: Secondary | ICD-10-CM

## 2024-02-05 DIAGNOSIS — M47816 Spondylosis without myelopathy or radiculopathy, lumbar region: Secondary | ICD-10-CM

## 2024-02-05 MED ORDER — HYDROCODONE-ACETAMINOPHEN 7.5-325 MG PO TABS: 1.0000 | ORAL_TABLET | Freq: Four times a day (QID) | ORAL | 0 refills | Status: AC | PRN

## 2024-02-05 MED ORDER — HYDROCODONE-ACETAMINOPHEN 7.5-325 MG PO TABS: 1.0000 | ORAL_TABLET | Freq: Four times a day (QID) | ORAL | 0 refills | Status: DC | PRN

## 2024-02-05 NOTE — Telephone Encounter (Signed)
 Requesting to change pharmacies because [facility] [address] is more convenient.  I cancelled both scripts for Hydrocodone , refill request sent to [facility] to [facility] to [facility] [address].

## 2024-02-05 NOTE — Telephone Encounter (Signed)
 While I was gone this patient was scheduled for facets and the procedure was done while I was gone as well. This required prior authorization. The procedure was already done so I dont know if I can get retro auth from medicare. They will not pay for this.

## 2024-02-05 NOTE — Telephone Encounter (Signed)
 [facility] is having trouble [facility] [address] [phone] is store # She will need her meds scripts sent there. Please call patient for any questions.

## 2024-02-26 ENCOUNTER — Telehealth: Payer: Self-pay

## 2024-02-26 NOTE — Telephone Encounter (Signed)
 She gave us  a paper to fill out for para transport with a stamped envelope and she wants to know if it was sent

## 2024-03-04 ENCOUNTER — Ambulatory Visit: Admitting: Cardiovascular Disease

## 2024-03-04 ENCOUNTER — Telehealth: Payer: Self-pay | Admitting: Nurse Practitioner

## 2024-03-04 ENCOUNTER — Other Ambulatory Visit: Payer: Self-pay | Admitting: *Deleted

## 2024-03-04 MED ORDER — METHOCARBAMOL 750 MG PO TABS: 750.0000 mg | ORAL_TABLET | Freq: Two times a day (BID) | ORAL | 2 refills | Status: DC | PRN

## 2024-03-04 NOTE — Telephone Encounter (Signed)
 Patient wants to get her methocarbamol  750mg  4 times a day script [facility] to [facility] [address] Please call patient when this is done. thanks

## 2024-03-05 NOTE — Telephone Encounter (Signed)
 Just checking if this was taken care of please. Thank you

## 2024-03-11 NOTE — Progress Notes (Signed)
 [facility] wq referral  Ordering User: Sherial Bail, MD    Auth Provider: SHERIAL BAIL Enc Provider: Sherial Bail, MD  Cosign Provider:   Department: [facility] - Internal Medicine  Diagnosis: Transportation insecurity    Sched Instruct:    Comment:    CPT Code: [id number]    Order [place] (CPT): [facility]NAGEMENT [facility]     Order Specific Questions  Question Answer Comment  [facility] Team Requested Care Management    Care Management Social drivers of health (e.g. transit, food access, housing)    Disease States (select all that apply) Other (explain below)    Clinical Concerns/Questions: Need for transportation    [facility] - Patient Enrolled  This serves as confirmation that your patient, Goldie Tregoning, has enrolled in [place].  Jarely Juncaj was identified by provider referral as a candidate for [facility] Coordination service(s).  An appointment has been scheduled for Jaid Quirion on 03/26/2024 with a [facility], Ranelle Conch . HEATHER MOSLEY    [address], [address]; [place], [place] [address] l  [email or web address] l [phone]   For more information on [facility] services, click here.

## 2024-03-24 NOTE — Progress Notes (Unsigned)
 PROVIDER NOTE: Interpretation of information contained herein should be left to medically-trained personnel. Specific patient instructions are provided elsewhere under Patient Instructions section of medical record. This document was created in part using AI and STT-dictation technology, any transcriptional errors that may result from this process are unintentional.  Patient: Jean Stout  Service: E/M   PCP: Sherial Bail, MD  DOB: September 15, 1940  DOS: 03/26/2024  Provider: Emmy MARLA Blanch, NP  MRN: [id number]  Delivery: Face-to-face  Specialty: Interventional Pain Management  Type: Established Patient  Setting: Ambulatory outpatient facility  Specialty designation: 09  Referring Prov.: Sherial Bail, MD  Location: Outpatient office facility       History of present illness (HPI) [name], a 83 y.o. year old female, is here today because of her Lumbar facet arthropathy [M47.816]. [name]'s primary complain today is No chief complaint on file.  Pertinent problems: [name] has OSA on CPAP; Type 2 diabetes mellitus without complication, without long-term current use of insulin  (HCC); Osteoarthritis of multiple joints; Primary osteoarthritis of left knee; Localized primary osteoarthritis of shoulder regions, bilateral; Chronic pain of right ankle; Chronic pain syndrome; Polyarthralgia; History of bilateral hip replacements; MDD (major depressive disorder), recurrent episode, moderate (HCC); Bilateral primary osteoarthritis of knee; Stage 3a chronic kidney disease (HCC); History of kyphoplasty; Lumbar spondylosis; Lumbar degenerative disc disease; Chronic cervical pain; Displacement of lumbar intervertebral disc without myelopathy; Constipation, unspecified; Myocardial infarction [facility]); and Presence of left artificial knee joint on their pertinent problem list.  Pain Assessment: Severity of   is reported as a  /10. Location:    / . Onset:  . Quality:  . Timing:  . Modifying factor(s):   SABRA Vitals:  vitals were not taken for this visit.  BMI: Estimated body mass index is 27.93 kg/m as calculated from the following:   Height as of 01/31/24: 5' (1.524 m).   Weight as of 01/31/24: 143 lb (64.9 kg).  Last encounter: 01/18/2024. Last procedure: Visit date not found.  Reason for encounter: both, medication management and post-procedure evaluation and assessment.   Discussed the use of AI scribe software for clinical note transcription with the patient, who gave verbal consent to proceed.  History of Present Illness          Procedure Procedure: Cervical Facet Medial Branch Block(s) #1  Laterality: Bilateral  Level: C4, C5, and C6 Medial Branch Level(s). Injecting these levels blocks the C4-5 and C5-6 cervical facet joints.  Imaging: Fluoroscopic guidance Anesthesia: Local anesthesia (1-2% Lidocaine ) Sedation: Minimal Sedation                       DOS: 01/31/2024  Performed by: Wallie Sherry, MD   Purpose: Diagnostic/Therapeutic Indications: Cervicalgia (cervical spine axial pain) severe enough to impact quality of life or function. 1. Chronic cervical pain   2. Cervical facet joint syndrome     NAS-11 Pain score:        Pre-procedure: 5 /10        Post-procedure: 5 /10   Post-Procedure Evaluation    Effectiveness:  Initial hour after procedure:   ***. Subsequent 4-6 hours post-procedure:   ***. Analgesia past initial 6 hours:   ***. Ongoing improvement:  Analgesic:  *** Function:    ***    ROM:    ***    Interpretation: ***  Pharmacotherapy Assessment   Monitoring: [place] PMP: PDMP reviewed during this encounter.       Pharmacotherapy: No side-effects or adverse reactions reported.  Compliance: No problems identified. Effectiveness: Clinically acceptable.  No notes on file  UDS:  Summary  Date Value Ref Range Status  01/02/2024 FINAL  Final    Comment:    ==================================================================== [employer]  (MW) ==================================================================== Test                             Result       Flag       Units  Drug Present and Declared for Prescription Verification   Hydrocodone                     3325         EXPECTED   ng/mg creat   Hydromorphone                   155          EXPECTED   ng/mg creat   Dihydrocodeine                 403          EXPECTED   ng/mg creat   Norhydrocodone                 4108         EXPECTED   ng/mg creat    Sources of hydrocodone  include scheduled prescription medications.    Hydromorphone , dihydrocodeine and norhydrocodone are expected    metabolites of hydrocodone . Hydromorphone  and dihydrocodeine are    also available as scheduled prescription medications.  Drug Absent but Declared for Prescription Verification   Diazepam                        Not Detected UNEXPECTED ng/mg creat ==================================================================== Test                      Result    Flag   Units      Ref Range   Creatinine              77               mg/dL      >=79 ==================================================================== Declared Medications:  The flagging and interpretation on this report are based on the  following declared medications.  Unexpected results may arise from  inaccuracies in the declared medications.   **Note: The testing scope of this panel includes these medications:   Diazepam  (Valium )  Hydrocodone  (Norco)   **Note: The testing scope of this panel does not include the  following reported medications:   Acetaminophen  (Norco)  Amlodipine  (Norvasc )  Aspirin   Atorvastatin  (Lipitor)  Chondroitin  Diclofenac (Voltaren)  Duloxetine  (Cymbalta )  Famotidine  (Pepcid )  Furosemide (Lasix)  Glucosamine  Hydralazine  (Apresoline )  Iron  Lisinopril  (Zestril )  Magnesium   Metformin   Methocarbamol  (Robaxin )  Multivitamin  Omeprazole (Prilosec)  Turmeric  Vitamin D3  Vortioxetine   (Trintellix ) ==================================================================== For clinical consultation, please call [phone]. ====================================================================     No results found for: CBDTHCR No results found for: D8THCCBX No results found for: D9THCCBX  ROS  Constitutional: Denies any fever or chills Gastrointestinal: No reported hemesis, hematochezia, vomiting, or acute GI distress Musculoskeletal: Denies any acute onset joint swelling, redness, loss of ROM, or weakness Neurological: No reported episodes of acute onset apraxia, aphasia, dysarthria, agnosia, amnesia, paralysis, loss of coordination, or loss of consciousness  Medication Review  DULoxetine , HYDROcodone -acetaminophen , Turmeric, amLODipine , aspirin  EC, atorvastatin , cholecalciferol , diclofenac  Sodium, ferrous sulfate, furosemide, glucosamine-chondroitin, hydrALAZINE , lisinopril , methocarbamol , multivitamin with minerals, and omeprazole  History Review  Allergy: Ms. Mallari is allergic to gadolinium derivatives, hm lidocaine  patch [lidocaine ], iodinated contrast media, lotrel [amlodipine  besy-benazepril hcl], and bextra [valdecoxib]. Drug: Ms. Brienza  reports no history of drug use. Alcohol:  reports current alcohol use of about 1.0 standard drink of alcohol per week. Tobacco:  reports that she has never smoked. She has never used smokeless tobacco. Social: Ms. Merk  reports that she has never smoked. She has never used smokeless tobacco. She reports current alcohol use of about 1.0 standard drink of alcohol per week. She reports that she does not use drugs. Medical:  has a past medical history of Anxiety and depression, Aortic stenosis, Arthritis, Cardiac murmur, CKD (chronic kidney disease), stage III (HCC), Coronary artery disease, GERD (gastroesophageal reflux disease), History of 2019 novel coronavirus disease (COVID-19) (12/29/2020), History of MI (myocardial infarction)  (2010), HLD (hyperlipidemia), HTN (hypertension), IDA (iron deficiency anemia), Mild mitral regurgitation, Myocardial infarction (HCC) (2010), OSA on CPAP, Osteopenia, PONV (postoperative nausea and vomiting), T2DM (type 2 diabetes mellitus) (HCC), and Vaginal prolapse. Surgical: Ms. [place]  has a past surgical history that includes Carotid stent; Joint replacement (Bilateral); Coronary angioplasty with stent (N/A, 2010); Eye surgery; Carpal tunnel release (Left, 1983); Cyst excision (1984); Kyphoplasty (N/A, 09/08/2020); Total knee arthroplasty (Left, 01/12/2021); Total knee arthroplasty (Right, 04/27/2021); Total hip arthroplasty (Right, 03/2005); Total hip arthroplasty (Left, 05/2005); and Knee Closed Reduction (Right, 05/27/2021). Family: family history includes Anxiety disorder in her mother; Bipolar disorder in her sister; Cancer in her daughter; Heart attack in her father and son; Hyperlipidemia in her son; Hypertension in her mother and son; Sleep apnea in her son; Sudden Cardiac Death (age of onset: 79) in her father; Sudden Cardiac Death (age of onset: 61) in her brother.  Laboratory Chemistry Profile   Renal Lab Results  Component Value Date   BUN 18 04/28/2021   CREATININE 0.89 04/28/2021   GFRNONAA >60 04/28/2021    Hepatic Lab Results  Component Value Date   AST 27 04/15/2021   ALT 20 04/15/2021   ALBUMIN 4.1 04/15/2021   ALKPHOS 87 04/15/2021    Electrolytes Lab Results  Component Value Date   NA 134 (L) 04/28/2021   K 4.2 04/28/2021   CL 104 04/28/2021   CALCIUM  8.8 (L) 04/28/2021    Bone No results found for: VD25OH, VD125OH2TOT, CI6874NY7, CI7874NY7, 25OHVITD1, 25OHVITD2, 25OHVITD3, TESTOFREE, TESTOSTERONE  Inflammation (CRP: Acute Phase) (ESR: Chronic Phase) No results found for: CRP, ESRSEDRATE, LATICACIDVEN       Note: Above Lab results reviewed.  Recent Imaging Review  DG PAIN CLINIC C-ARM 1-60 MIN NO REPORT Fluoro was used, but no  Radiologist interpretation will be provided.  Please refer to NOTES tab for provider progress note. Note: Reviewed        Physical Exam  Vitals: There were no vitals taken for this visit. BMI: Estimated body mass index is 27.93 kg/m as calculated from the following:   Height as of 01/31/24: 5' (1.524 m).   Weight as of 01/31/24: 143 lb (64.9 kg). Ideal: Patient weight not recorded General appearance: Well nourished, well developed, and well hydrated. In no apparent acute distress Mental status: Alert, oriented x 3 (person, place, & time)       Respiratory: No evidence of acute respiratory distress Eyes: PERLA   Assessment   Diagnosis Status  1. Lumbar facet arthropathy   2. Lumbar spondylosis   3. Chronic  pain syndrome   4. Medication management   5. Chronic cervical pain   6. Cervical facet joint syndrome   7. History of kyphoplasty    Controlled Controlled Controlled   Updated Problems: Problem  Displacement of Lumbar Intervertebral Disc Without Myelopathy  Myocardial Infarction (Hcc)   Onset Date: [id number]   Constipation, Unspecified  Presence of Left Artificial Knee Joint  Atherosclerosis of Coronary Artery Without Angina Pectoris  Encounter for Insertion of Prosthetic Knee After Prior Removal of Knee Prosthesis  Encounter for Insertion of Prosthetic Knee After Prior Removal of Knee Prosthesis    Plan of Care  Problem-specific:  Assessment and Plan            Ms. Sonakshi Rolland has a current medication list which includes the following long-term medication(s): amlodipine , atorvastatin , furosemide, hydralazine , lisinopril , and omeprazole.  Pharmacotherapy (Medications Ordered): No orders of the defined types were placed in this encounter.  Orders:  No orders of the defined types were placed in this encounter.    Return in about 2 weeks (around 02/01/2024) for (Clinic): (B) C-FCT (MBNB) # 1 with Dr. Marcelino .    Recent Visits Date Type Provider Dept   01/02/24 Office Visit Albaro Deviney K, NP [facility]  Showing recent visits within past 90 days and meeting all other requirements Today's Visits Date Type Provider Dept  01/18/24 Office Visit Aylssa Herrig K, NP [facility]  Showing today's visits and meeting all other requirements Future Appointments Date Type Provider Dept  01/31/24 Appointment Marcelino Nurse, MD [facility]  03/27/24 Appointment Lekeya Rollings K, NP [facility]  Showing future appointments within next 90 days and meeting all other requirements  I discussed the assessment and treatment plan with the patient. The patient was provided an opportunity to ask questions and all were answered. The patient agreed with the plan and demonstrated an understanding of the instructions.  Patient advised to call back or seek an in-person evaluation if the symptoms or condition worsens.  I personally spent a total of 20 minutes in the care of the patient today including preparing to see the patient, getting/reviewing separately obtained history, performing a medically appropriate exam/evaluation, counseling and educating, placing orders, referring and communicating with other health care professionals, documenting clinical information in the EHR, independently interpreting results, communicating results, and coordinating care.   Note by: Jamille Fisher K Kyesha Balla, NP (TTS and AI technology used. I apologize for any typographical errors that were not detected and corrected.) Date: 01/18/2024; Time: 3:48 PM    No follow-ups on file.    Recent Visits Date Type Provider Dept  01/31/24 Procedure visit Marcelino Nurse, MD [facility]  01/18/24 Office Visit Oneika Simonian K, NP [facility]  01/02/24 Office Visit Dionne Knoop K, NP [facility]  Showing recent visits within past 90 days and meeting all other requirements Future Appointments Date Type Provider Dept  03/26/24 Appointment  Jovonni Borquez K, NP [facility]  Showing future appointments within next 90 days and meeting all other requirements  I discussed the assessment and treatment plan with the patient. The patient was provided an opportunity to ask questions and all were answered. The patient agreed with the plan and demonstrated an understanding of the instructions.  Patient advised to call back or seek an in-person evaluation if the symptoms or condition worsens.  Duration of encounter: *** minutes.  Total time on encounter, as per AMA guidelines included both the face-to-face and non-face-to-face time  personally spent by the physician and/or other qualified health care professional(s) on the day of the encounter (includes time in activities that require the physician or other qualified health care professional and does not include time in activities normally performed by clinical staff). Physician's time may include the following activities when performed: Preparing to see the patient (e.g., pre-charting review of records, searching for previously ordered imaging, lab work, and nerve conduction tests) Review of prior analgesic pharmacotherapies. Reviewing PMP Interpreting ordered tests (e.g., lab work, imaging, nerve conduction tests) Performing post-procedure evaluations, including interpretation of diagnostic procedures Obtaining and/or reviewing separately obtained history Performing a medically appropriate examination and/or evaluation Counseling and educating the patient/family/caregiver Ordering medications, tests, or procedures Referring and communicating with other health care professionals (when not separately reported) Documenting clinical information in the electronic or other health record Independently interpreting results (not separately reported) and communicating results to the patient/ family/caregiver Care coordination (not separately reported)  Note by: Jeannetta Cerutti K Devita Nies, NP (TTS and AI  technology used. I apologize for any typographical errors that were not detected and corrected.) Date: 03/26/2024; Time: 1:58 PM

## 2024-03-26 ENCOUNTER — Ambulatory Visit
Admission: RE | Admit: 2024-03-26 | Discharge: 2024-03-26 | Disposition: A | Discharge status: Home / Self Care | Source: Ambulatory Visit | Attending: Nurse Practitioner | Admitting: Nurse Practitioner

## 2024-03-26 ENCOUNTER — Ambulatory Visit (HOSPITAL_BASED_OUTPATIENT_CLINIC_OR_DEPARTMENT_OTHER): Admitting: Nurse Practitioner

## 2024-03-26 ENCOUNTER — Encounter: Payer: Self-pay | Admitting: Nurse Practitioner

## 2024-03-26 VITALS — BP 149/62 | HR 62 | Temp 97.2°F | Resp 16 | Ht 60.0 in | Wt 143.0 lb

## 2024-03-26 DIAGNOSIS — M47816 Spondylosis without myelopathy or radiculopathy, lumbar region: Secondary | ICD-10-CM | POA: Insufficient documentation

## 2024-03-26 DIAGNOSIS — G894 Chronic pain syndrome: Secondary | ICD-10-CM

## 2024-03-26 DIAGNOSIS — M25511 Pain in right shoulder: Secondary | ICD-10-CM | POA: Diagnosis present

## 2024-03-26 DIAGNOSIS — G8929 Other chronic pain: Secondary | ICD-10-CM | POA: Insufficient documentation

## 2024-03-26 DIAGNOSIS — M47812 Spondylosis without myelopathy or radiculopathy, cervical region: Secondary | ICD-10-CM

## 2024-03-26 DIAGNOSIS — M542 Cervicalgia: Secondary | ICD-10-CM

## 2024-03-26 DIAGNOSIS — Z79899 Other long term (current) drug therapy: Secondary | ICD-10-CM | POA: Insufficient documentation

## 2024-03-26 DIAGNOSIS — Z9889 Other specified postprocedural states: Secondary | ICD-10-CM | POA: Insufficient documentation

## 2024-03-26 MED ORDER — HYDROCODONE-ACETAMINOPHEN 7.5-325 MG PO TABS: 1.0000 | ORAL_TABLET | Freq: Three times a day (TID) | ORAL | 0 refills | Status: AC | PRN

## 2024-03-26 MED ORDER — METHOCARBAMOL 750 MG PO TABS: 750.0000 mg | ORAL_TABLET | Freq: Three times a day (TID) | ORAL | 2 refills | Status: DC | PRN

## 2024-03-26 NOTE — Progress Notes (Signed)
 Nursing Pain Medication Assessment:  Safety precautions to be maintained throughout the outpatient stay will include: orient to surroundings, keep bed in low position, maintain call bell within reach at all times, provide assistance with transfer out of bed and ambulation.  Medication Inspection Compliance: Pill count conducted under aseptic conditions, in front of the patient. Neither the pills nor the bottle was removed from the patient's sight at any time. Once count was completed pills were immediately returned to the patient in their original bottle.  Medication: Hydrocodone /APAP Pill/Patch Count: 73 of 120 pills/patches remain Pill/Patch Appearance: Markings consistent with prescribed medication Bottle Appearance: Standard pharmacy container. Clearly labeled. Filled Date: 67 / 71 / 2025 Last Medication intake:  TodaySafety precautions to be maintained throughout the outpatient stay will include: orient to surroundings, keep bed in low position, maintain call bell within reach at all times, provide assistance with transfer out of bed and ambulation.   Methocarbamol  750mg   Patient states she has two pills remaining in pill box at home.

## 2024-03-26 NOTE — Patient Instructions (Signed)

## 2024-03-27 ENCOUNTER — Encounter: Admitting: Nurse Practitioner

## 2024-04-03 ENCOUNTER — Encounter: Payer: Self-pay | Admitting: Student in an Organized Health Care Education/Training Program

## 2024-04-03 ENCOUNTER — Ambulatory Visit
Admission: RE | Admit: 2024-04-03 | Discharge: 2024-04-03 | Disposition: A | Discharge status: Home / Self Care | Source: Ambulatory Visit | Attending: Student in an Organized Health Care Education/Training Program | Admitting: Student in an Organized Health Care Education/Training Program

## 2024-04-03 ENCOUNTER — Ambulatory Visit: Admitting: Student in an Organized Health Care Education/Training Program

## 2024-04-03 DIAGNOSIS — M47816 Spondylosis without myelopathy or radiculopathy, lumbar region: Secondary | ICD-10-CM | POA: Insufficient documentation

## 2024-04-03 MED ORDER — LIDOCAINE HCL 2 % IJ SOLN
INTRAMUSCULAR | Status: AC
  Filled 2024-04-03: qty 20

## 2024-04-03 MED ORDER — DEXAMETHASONE SOD PHOSPHATE PF 10 MG/ML IJ SOLN
INTRAMUSCULAR | Status: AC
  Filled 2024-04-03: qty 2

## 2024-04-03 MED ORDER — ROPIVACAINE HCL 2 MG/ML IJ SOLN
18.0000 mL | Freq: Once | INTRAMUSCULAR | Status: AC
  Administered 2024-04-03: 18 mL via PERINEURAL

## 2024-04-03 MED ORDER — ROPIVACAINE HCL 2 MG/ML IJ SOLN
INTRAMUSCULAR | Status: AC
  Filled 2024-04-03: qty 20

## 2024-04-03 MED ORDER — LACTATED RINGERS IV SOLN: Freq: Once | INTRAVENOUS | Status: DC

## 2024-04-03 MED ORDER — DEXAMETHASONE SOD PHOSPHATE PF 10 MG/ML IJ SOLN
20.0000 mg | Freq: Once | INTRAMUSCULAR | Status: AC
  Administered 2024-04-03: 20 mg

## 2024-04-03 MED ORDER — LIDOCAINE HCL 2 % IJ SOLN
20.0000 mL | Freq: Once | INTRAMUSCULAR | Status: AC
  Administered 2024-04-03: 400 mg

## 2024-04-03 MED ORDER — MIDAZOLAM HCL (PF) 2 MG/2ML IJ SOLN: 0.5000 mg | Freq: Once | INTRAMUSCULAR | Status: DC

## 2024-04-03 NOTE — Progress Notes (Signed)
 PROVIDER NOTE: Interpretation of information contained herein should be left to medically-trained personnel. Specific patient instructions are provided elsewhere under Patient Instructions section of medical record. This document was created in part using STT-dictation technology, any transcriptional errors that may result from this process are unintentional.  Patient: Jean Stout Type: Established DOB: 1941/02/08 MRN: [id number] PCP: Sherial Bail, MD  Service: Procedure DOS: 04/03/2024 Setting: Ambulatory Location: Ambulatory outpatient facility Delivery: Face-to-face Provider: Wallie Sherry, MD Specialty: Interventional Pain Management Specialty designation: 09 Location: Outpatient facility Ref. Prov.: Patel, Seema K, NP       Interventional Therapy   Type: Lumbar Facet, Medial Branch Block(s) (w/ fluoroscopic mapping) #1  Laterality: Bilateral  Level: L3, L4, and L5 Medial Branch/Dorsal Rami Level(s). Injecting these levels blocks the L3-4 and L4-5 lumbar facet joints. Imaging: Fluoroscopic guidance Spinal ([id number]) Anesthesia: Local anesthesia (1-2% Lidocaine ) Sedation: Minimal Sedation                       DOS: 04/03/2024 Performed by: Wallie Sherry, MD  Primary Purpose: Diagnostic/Therapeutic Indications: Low back pain severe enough to impact quality of life or function. 1. Lumbar facet arthropathy   2. Lumbar spondylosis    NAS-11 Pain score:   Pre-procedure: 6 /10   Post-procedure: 0-No pain/10     Position / Prep / Materials:  Position: Prone  Prep solution: ChloraPrep (2% chlorhexidine  gluconate and 70% isopropyl alcohol) Area Prepped: Posterolateral Lumbosacral Spine (Wide prep: From the lower border of the scapula down to the end of the tailbone and from flank to flank.)  Materials:  Tray: Block Needle(s):  Type: Spinal  Gauge (G): 22  Length: 3.5-in Qty: 3     H&P (Pre-op Assessment):  Jean Stout is a 84 y.o. (year old), female patient, seen today  for interventional treatment. 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 Jean Stout has a current medication list which includes the following prescription(s): amlodipine , aspirin  ec, atorvastatin , cholecalciferol , diclofenac sodium, duloxetine , duloxetine , ferrous sulfate, furosemide, glucosamine-chondroitin, hydralazine , [START ON 04/04/2024] hydrocodone -acetaminophen , [START ON 05/04/2024] hydrocodone -acetaminophen , [START ON 06/03/2024] hydrocodone -acetaminophen , lisinopril , [START ON 04/04/2024] methocarbamol , multivitamin with minerals, omeprazole, and turmeric, and the following Facility-Administered Medications: lactated ringers  and midazolam  pf. Her primarily concern today is the Back Pain (Lower/)  Initial Vital Signs:  Pulse/HCG Rate: (!) 58ECG Heart Rate: 68 Temp: 98.2 F (36.8 C) Resp: 16 BP: (!) 139/109 SpO2: 95 %  BMI: Estimated body mass index is 27.93 kg/m as calculated from the following:   Height as of this encounter: 5' (1.524 m).   Weight as of this encounter: 143 lb (64.9 kg).  Risk Assessment: Allergies: Reviewed. She is allergic to gadolinium derivatives, hm lidocaine  patch [lidocaine ], iodinated contrast media, lotrel [amlodipine  besy-benazepril hcl], and bextra [valdecoxib].  Allergy Precautions: None required Coagulopathies: Reviewed. None identified.  Blood-thinner therapy: None at this time Active Infection(s): Reviewed. None identified. Jean Stout is afebrile  Site Confirmation: Jean Stout was asked to confirm the procedure and laterality before marking the  site Procedure checklist: Completed Consent: Before the procedure and under the influence of no sedative(s), amnesic(s), or anxiolytics, the patient was informed of the treatment options, risks and possible complications. To fulfill our ethical  and legal obligations, as recommended by the American Medical Association's Code of Ethics, I have informed the patient of my clinical impression; the nature and purpose of the treatment or procedure; the risks, benefits, and possible complications of the intervention; the alternatives, including doing nothing; the risk(s) and benefit(s) of the alternative treatment(s) or procedure(s); and the risk(s) and benefit(s) of doing nothing. The patient was provided information about the general risks and possible complications associated with the procedure. These may include, but are not limited to: failure to achieve desired goals, infection, bleeding, organ or nerve damage, allergic reactions, paralysis, and death. In addition, the patient was informed of those risks and complications associated to Spine-related procedures, such as failure to decrease pain; infection (i.e.: Meningitis, epidural or intraspinal abscess); bleeding (i.e.: epidural hematoma, subarachnoid hemorrhage, or any other type of intraspinal or peri-dural bleeding); organ or nerve damage (i.e.: Any type of peripheral nerve, nerve root, or spinal cord injury) with subsequent damage to sensory, motor, and/or autonomic systems, resulting in permanent pain, numbness, and/or weakness of one or several areas of the body; allergic reactions; (i.e.: anaphylactic reaction); and/or death. Furthermore, the patient was informed of those risks and complications associated with the medications. These include, but are not limited to: allergic reactions (i.e.: anaphylactic or anaphylactoid reaction(s)); adrenal axis suppression; blood sugar elevation that in diabetics may result in ketoacidosis or comma; water retention  that in patients with history of congestive heart failure may result in shortness of breath, pulmonary edema, and decompensation with resultant heart failure; weight gain; swelling or edema; medication-induced neural toxicity; particulate matter embolism and blood vessel occlusion with resultant organ, and/or nervous system infarction; and/or aseptic necrosis of one or more joints. Finally, the patient was informed that Medicine is not an exact science; therefore, there is also the possibility of unforeseen or unpredictable risks and/or possible complications that may result in a catastrophic outcome. The patient indicated having understood very clearly. We have given the patient no guarantees and we have made no promises. Enough time was given to the patient to ask questions, all of which were answered to the patient's satisfaction. Ms. Shewmake has indicated that she wanted to continue with the procedure. Attestation: I, the ordering provider, attest that I have discussed with the patient the benefits, risks, side-effects, alternatives, likelihood of achieving goals, and potential problems during recovery for the procedure that I have provided informed consent. Date  Time: 04/03/2024 12:39 PM  Pre-Procedure Preparation:  Monitoring: As per clinic protocol. Respiration, ETCO2, SpO2, BP, heart rate and rhythm monitor placed and checked for adequate function Safety Precautions: Patient was assessed for positional comfort and pressure points before starting the procedure. Time-out: I initiated and conducted the Time-out before starting the procedure, as per protocol. The patient was asked to participate by confirming the accuracy of the Time Out information. Verification of the correct person, site, and procedure were performed and confirmed by me, the nursing staff, and the patient. Time-out conducted as per Joint Commission's Universal Protocol (UP.01.01.01). Time: 1338 Start Time: 1338  hrs.  Description of Procedure:          Laterality: (see above) Targeted Levels: (see above)  Safety Precautions: Aspiration looking for blood return was conducted prior to all injections. At no point did we inject any substances, as a needle was being advanced. Before injecting, the patient was told to immediately notify me if she was experiencing any new onset of ringing in the ears, or metallic taste in the mouth. No attempts  were made at seeking any paresthesias. Safe injection practices and needle disposal techniques used. Medications properly checked for expiration dates. SDV (single dose vial) medications used. After the completion of the procedure, all disposable equipment used was discarded in the proper designated medical waste containers. Local Anesthesia: Protocol guidelines were followed. The patient was positioned over the fluoroscopy table. The area was prepped in the usual manner. The time-out was completed. The target area was identified using fluoroscopy. A 12-in long, straight, sterile hemostat was used with fluoroscopic guidance to locate the targets for each level blocked. Once located, the skin was marked with an approved surgical skin marker. Once all sites were marked, the skin (epidermis, dermis, and hypodermis), as well as deeper tissues (fat, connective tissue and muscle) were infiltrated with a small amount of a short-acting local anesthetic, loaded on a 10cc syringe with a 25G, 1.5-in  Needle. An appropriate amount of time was allowed for local anesthetics to take effect before proceeding to the next step. Local Anesthetic: Lidocaine  2.0% The unused portion of the local anesthetic was discarded in the proper designated containers. Technical description of process:  Medial Branch  Dorsal Rami Nerve Block (MBB):  Neuroanatomy note: Each lumbar facet joint receives dual innervation from medial branches arising from the posterior primary rami at the same level and one level  above. The target for each lumbar medial branch is the junction of the ipsilateral superior articular and transverse process of the lower vertebral body. (i.e.: The L4-L5 facet joint is innervated by the L4 medial branch [located at L5] and the L3 medial branch [located at L4]. Blocking the L4 Medial Branch is therefore achieved by injecting at the junction of the ipsilateral superior articular and transverse process of the lower vertebral body [L5].).  Exception: The exception to the above rule is the L5-S1 facet joint which has triple innervation requiring the L4 medial branch, as well as the L5 and the S1 Dorsal Rami(s) to be blocked to fully denervate the joint.  Under fluoroscopic guidance, a needle was inserted until contact was made with os over the target area. After negative aspiration, 2mL of the nerve block solution was injected without difficulty or complication. Paresthesia were avoided during injection. The needle(s) were removed intact and without complication.  Once the entire procedure was completed, the treated area was cleaned, making sure to leave some of the prepping solution back to take advantage of its long term bactericidal properties.         Illustration of the posterior view of the lumbar spine and the posterior neural structures. Laminae of L2 through S1 are labeled. DPRL5, dorsal primary ramus of L5; DPRS1, dorsal primary ramus of S1; DPR3, dorsal primary ramus of L3; FJ, facet (zygapophyseal) joint L3-L4; I, inferior articular process of L4; LB1, lateral branch of dorsal primary ramus of L1; IAB, inferior articular branches from L3 medial branch (supplies L4-L5 facet joint); IBP, intermediate branch plexus; MB3, medial branch of dorsal primary ramus of L3; NR3, third lumbar nerve root; S, superior articular process of L5; SAB, superior articular branches from L4 (supplies L4-5 facet joint also); TP3, transverse process of L3.   Facet Joint Innervation (* possible  contribution)  L1-2 T12, L1 (L2*)  Medial Branch  L2-3 L1, L2 (L3*)                     L3-4 L2, L3 (L4*)  L4-5 L3, L4 (L5*)                     L5-S1 L4, L5, S1                        Vitals:   04/03/24 1332 04/03/24 1338 04/03/24 1344 04/03/24 1346  BP: (!) 144/85 (!) 138/90 (!) 148/86 (!) 148/87  Pulse:      Resp: 17 15 18 17   Temp:      TempSrc:      SpO2: 98% 100% 100% 99%  Weight:      Height:         End Time: 1345 hrs.  Imaging Guidance (Spinal):         Type of Imaging Technique: Fluoroscopy Guidance (Spinal) Indication(s): Fluoroscopy guidance for needle placement to enhance accuracy in procedures requiring precise needle localization for targeted delivery of medication in or near specific anatomical locations not easily accessible without such real-time imaging assistance. Exposure Time: Please see nurses notes. Contrast: None used. Fluoroscopic Guidance: I was personally present during the use of fluoroscopy. Tunnel Vision Technique used to obtain the best possible view of the target area. Parallax error corrected before commencing the procedure. Direction-depth-direction technique used to introduce the needle under continuous pulsed fluoroscopy. Once target was reached, antero-posterior, oblique, and lateral fluoroscopic projection used confirm needle placement in all planes. Images permanently stored in EMR. Interpretation: No contrast injected. I personally interpreted the imaging intraoperatively. Adequate needle placement confirmed in multiple planes. Permanent images saved into the patient's record.  Post-operative Assessment:  Post-procedure Vital Signs:  Pulse/HCG Rate: (!) 5866 Temp: 98.2 F (36.8 C) Resp: 17 BP: (!) 148/87 SpO2: 99 %  EBL: None  Complications: No immediate post-treatment complications observed by team, or reported by patient.  Note: The patient tolerated the entire procedure well. A repeat set of vitals  were taken after the procedure and the patient was kept under observation following institutional policy, for this type of procedure. Post-procedural neurological assessment was performed, showing return to baseline, prior to discharge. The patient was provided with post-procedure discharge instructions, including a section on how to identify potential problems. Should any problems arise concerning this procedure, the patient was given instructions to immediately contact us , at any time, without hesitation. In any case, we plan to contact the patient by telephone for a follow-up status report regarding this interventional procedure.  Comments:  No additional relevant information.  Plan of Care (POC)  Orders:  Orders Placed This Encounter  Procedures   DG PAIN [facility] C-ARM 1-60 MIN NO REPORT    Intraoperative interpretation by procedural physician at [facility].    Standing Status:   Standing    Number of Occurrences:   1    Reason for exam::   Assistance in needle guidance and placement for procedures requiring needle placement in or near specific anatomical locations not easily accessible without such assistance.    Medications ordered for procedure: Meds ordered this encounter  Medications   lidocaine  (XYLOCAINE ) 2 % (with pres) injection 400 mg   lactated ringers  infusion   midazolam  PF (VERSED ) injection 0.5-2 mg    Make sure Flumazenil is available in the pyxis when using this medication. If oversedation occurs, administer 0.2 mg IV over 15 sec. If after 45 sec no response, administer 0.2 mg again over 1 min; may repeat at 1 min intervals; not to exceed 4 doses (1 mg)  ropivacaine  (PF) 2 mg/mL (0.2%) (NAROPIN ) injection 18 mL   dexamethasone  (DECADRON ) injection 20 mg   Medications administered: We administered lidocaine , ropivacaine  (PF) 2 mg/mL (0.2%), and dexamethasone .  See the medical record for exact dosing, route, and time of administration.    C4-6 MBNB  01/31/24 B/L L3,4,5 MBNB 04/03/24      Follow-up plan:   Return in about 4 weeks (around 05/01/2024), or F2F PPE.     Recent Visits Date Type Provider Dept  03/26/24 Office Visit Patel, Seema K, NP [facility]  01/31/24 Procedure visit Marcelino Nurse, MD [facility]  01/18/24 Office Visit Patel, Seema K, NP [facility]  Showing recent visits within past 90 days and meeting all other requirements Today's Visits Date Type Provider Dept  04/03/24 Procedure visit Marcelino Nurse, MD [facility]  Showing today's visits and meeting all other requirements Future Appointments Date Type Provider Dept  04/30/24 Appointment Marcelino Nurse, MD [facility]  06/20/24 Appointment Patel, Seema K, NP [facility]  Showing future appointments within next 90 days and meeting all other requirements   Disposition: Discharge home  Discharge (Date  Time): 04/03/2024; 1355 hrs.   Primary Care Physician: Sherial Bail, MD Location: [facility] Note by: Nurse Marcelino, MD (TTS technology used. I apologize for any typographical errors that were not detected and corrected.) Date: 04/03/2024; Time: 2:02 PM  Disclaimer:  Medicine is not an visual merchandiser. The only guarantee in medicine is that nothing is guaranteed. It is important to note that the decision to proceed with this intervention was based on the information collected from the patient. The Data and conclusions were drawn from the patient's questionnaire, the interview, and the physical examination. Because the information was provided in large part by the patient, it cannot be guaranteed that it has not been purposely or unconsciously manipulated. Every effort has been made to obtain as much relevant data as possible for this evaluation. It is important to note that the conclusions that lead to this procedure are derived in large part from the available data. Always take  into account that the treatment will also be dependent on availability of resources and existing treatment guidelines, considered by other Pain Management Practitioners as being common knowledge and practice, at the time of the intervention. For Medico-Legal purposes, it is also important to point out that variation in procedural techniques and pharmacological choices are the acceptable norm. The indications, contraindications, technique, and results of the above procedure should only be interpreted and judged by a Board-Certified Interventional Pain Specialist with extensive familiarity and expertise in the same exact procedure and technique.

## 2024-04-03 NOTE — Patient Instructions (Signed)
 Pain Management Discharge Instructions  General Discharge Instructions :  If you need to reach your doctor call: Monday-Friday 8:00 am - 4:00 pm at [phone] or toll free [phone].  After clinic hours [phone] to have operator reach doctor.  Bring all of your medication bottles to all your appointments in the pain clinic.  To cancel or reschedule your appointment with Pain Management please remember to call 24 hours in advance to avoid a fee.  Refer to the educational materials which you have been given on: General Risks, I had my Procedure. Discharge Instructions, Post Sedation.  Post Procedure Instructions:  The drugs you were given will stay in your system until tomorrow, so for the next 24 hours you should not drive, make any legal decisions or drink any alcoholic beverages.  You may eat anything you prefer, but it is better to start with liquids then soups and crackers, and gradually work up to solid foods.  Please notify your doctor immediately if you have any unusual bleeding, trouble breathing or pain that is not related to your normal pain.  Depending on the type of procedure that was done, some parts of your body may feel week and/or numb.  This usually clears up by tonight or the next day.  Walk with the use of an assistive device or accompanied by an adult for the 24 hours.  You may use ice on the affected area for the first 24 hours.  Put ice in a Ziploc bag and cover with a towel and place against area 15 minutes on 15 minutes off.  You may switch to heat after 24 hours.Facet Blocks Patient Information  Description: The facets are joints in the spine between the vertebrae.  Like any joints in the body, facets can become irritated and painful.  Arthritis can also effect the facets.  By injecting steroids and local anesthetic in and around these joints, we can temporarily block the nerve supply to them.  Steroids act directly on irritated nerves and tissues to  reduce selling and inflammation which often leads to decreased pain.  Facet blocks may be done anywhere along the spine from the neck to the low back depending upon the location of your pain.   After numbing the skin with local anesthetic (like Novocaine), a small needle is passed onto the facet joints under x-ray guidance.  You may experience a sensation of pressure while this is being done.  The entire block usually lasts about 15-25 minutes.   Conditions which may be treated by facet blocks:  Low back/buttock pain Neck/shoulder pain Certain types of headaches  Preparation for the injection:  Do not eat any solid food or dairy products within 8 hours of your appointment. You may drink clear liquid up to 3 hours before appointment.  Clear liquids include water, black coffee, juice or soda.  No milk or cream please. You may take your regular medication, including pain medications, with a sip of water before your appointment.  Diabetics should hold regular insulin (if taken separately) and take 1/2 normal NPH dose the morning of the procedure.  Carry some sugar containing items with you to your appointment. A driver must accompany you and be prepared to drive you home after your procedure. Bring all your current medications with you. An IV may be inserted and sedation may be given at the discretion of the physician. A blood pressure cuff, EKG and other monitors will often be applied during the procedure.  Some patients may need to  have extra oxygen administered for a short period. You will be asked to provide medical information, including your allergies and medications, prior to the procedure.  We must know immediately if you are taking blood thinners (like Coumadin/Warfarin) or if you are allergic to IV iodine contrast (dye).  We must know if you could possible be pregnant.  Possible side-effects:  Bleeding from needle site Infection (rare, may require surgery) Nerve injury (rare) Numbness  & tingling (temporary) Difficulty urinating (rare, temporary) Spinal headache (a headache worse with upright posture) Light-headedness (temporary) Pain at injection site (serveral days) Decreased blood pressure (rare, temporary) Weakness in arm/leg (temporary) Pressure sensation in back/neck (temporary)   Call if you experience:  Fever/chills associated with headache or increased back/neck pain Headache worsened by an upright position New onset, weakness or numbness of an extremity below the injection site Hives or difficulty breathing (go to the emergency room) Inflammation or drainage at the injection site(s) Severe back/neck pain greater than usual New symptoms which are concerning to you  Please note:  Although the local anesthetic injected can often make your back or neck feel good for several hours after the injection, the pain will likely return. It takes 3-7 days for steroids to work.  You may not notice any pain relief for at least one week.  If effective, we will often do a series of 2-3 injections spaced 3-6 weeks apart to maximally decrease your pain.  After the initial series, you may be a candidate for a more permanent nerve block of the facets.  If you have any questions, please call #[phone] [facility]

## 2024-04-19 NOTE — Progress Notes (Signed)
 Chief Complaint:   Chief Complaint  Patient presents with   Follow-up    Subjective:   [facility] is a 84 y.o. female in today for: Follow-up today  History of Present Illness Jean Stout is an 84 year old female with hypertension, diabetes, chronic kidney disease, and constipation who presents for routine follow-up.  Lives independently in an apartment.  Ambulates with a walker that has a seat  Since last visit, still experiencing some constipation.  KUB showing stool backed up and she was advised to use over-the-counter medications which she has not been doing consistently.  No further rectal bleeding.  Labs reviewed and very stable.  She has been taking her medications.  Blood pressure is stable.  She is happy with the transportation that she has through the city..  No recent falls, although she slipped once but was able to get up without major issues. She has right shoulder pain, for which she has received injections.    Current Outpatient Medications  Medication Sig Dispense Refill   amLODIPine  (NORVASC ) 10 MG tablet Take 10 mg by mouth once daily     aspirin  81 MG EC tablet Take 1 tablet (81 mg total) by mouth once daily     atorvastatin  (LIPITOR) 40 MG tablet take 1 tablet by mouth everyday at bedtime 90 tablet 1   cholecalciferol  (VITAMIN D3) 1000 unit tablet Take by mouth     DULoxetine  (CYMBALTA ) 30 MG DR capsule TAKE 1 CAPSULE BY MOUTH 3 TIMES DAILY. TAKE 3 CAPSULES ONCE DAILY 270 capsule 1   ferrous sulfate  325 (65 FE) MG tablet Take 325 mg by mouth     FUROsemide  (LASIX ) 20 MG tablet Take 20 mg by mouth once daily     glucosamine su 2KCl-chondroit 500-400 mg Tab Take by mouth     hydrALAZINE  (APRESOLINE ) 50 MG tablet Take 1 tablet (50 mg total) by mouth 3 (three) times daily 270 tablet 3   HYDROcodone -acetaminophen  (NORCO) 7.5-325 mg tablet Take 1 tablet by mouth 3 (three) times a day     lisinopriL  (ZESTRIL ) 40 MG tablet take 1 tablet by mouth every  day 90 tablet 1   methocarbamoL  (ROBAXIN ) 750 MG tablet Take 1 tablet (750 mg total) by mouth 3 (three) times daily 21 tablet 0   multivitamin tablet Take 1 tablet by mouth once daily     omeprazole (PRILOSEC) 20 MG DR capsule TAKE 1 CAPSULE BY MOUTH TWICE A DAY BEFORE MEALS 90 capsule 1   TURMERIC ORAL Take by mouth     No current facility-administered medications for this visit.    Allergies as of 04/19/2024 - Reviewed 04/19/2024  Allergen Reaction Noted   Gadolinium-containing contrast media Rash 03/01/2018   Iodinated contrast media Anaphylaxis 07/10/2018   Amlodipine  besylate Itching 12/22/2021   Bextra [valdecoxib] Rash 03/01/2018   Lotrel [amlodipine -benazepril] Rash 03/01/2018    Past Medical History:  Diagnosis Date   Allergy    Anxiety    Arthritis    Depression    Diabetes mellitus without complication (CMS/HHS-HCC)    GERD (gastroesophageal reflux disease)    History of myocardial infarction    Hyperlipidemia    Hypertension    Sleep apnea    Vaginal prolapse     Past Surgical History:  Procedure Laterality Date   L2 FRACTURE  09/08/2020   Lumbar   ARTHROPLASTY TOTAL KNEE Left 01/12/2021   by Dr. Kathlynn   ARTHROPLASTY TOTAL KNEE Right 04/27/2021   By Dr. Kathlynn   BTL  CORONARY ANGIOPLASTY     ENDOSCOPIC CARPAL TUNNEL RELEASE Right    KNEE ARTHROSCOPY     REPLACEMENT TOTAL HIP W/  RESURFACING IMPLANTS Bilateral    Right - January 2007; Left - March 2007     Family History  Problem Relation Name Age of Onset   High blood pressure (Hypertension) Mother     Anxiety Mother     Myocardial Infarction (Heart attack) Father     Sudden cardiac death Father  30   Hyperlipidemia (Elevated cholesterol) Sister Rollo    High blood pressure (Hypertension) Sister Rollo    Myocardial Infarction (Heart attack) Sister Rollo    Myocardial Infarction (Heart attack) Brother     High blood pressure (Hypertension) Brother      Sudden cardiac death Brother  36   Breast cancer Brother     Testicular cancer Brother     High blood pressure (Hypertension) Sister Twin - Patty    Anxiety Sister Twin - Patty    Breast cancer Daughter Colleen    Myocardial Infarction (Heart attack) Son Lynwood    Hyperlipidemia (Elevated cholesterol) Son Lynwood    Coronary Artery Disease (Blocked arteries around heart) Son Lynwood    Hearing loss Brother Ted    Atrial fibrillation (Abnormal heart rhythm sometimes requiring treatment with blood thinners) Brother Ted    High blood pressure (Hypertension) Brother Ted    Hyperlipidemia (Elevated cholesterol) Brother Ted    Sleep apnea Son Roland    Hyperlipidemia (Elevated cholesterol) Son Engineer, Maintenance (it)     Social History:  reports that she has never smoked. She has never used smokeless tobacco. She reports that she does not currently use alcohol. She reports that she does not use drugs.  Results for orders placed or performed in visit on 04/10/24  CBC w/auto Differential (5 Part)  Result Value Ref Range   WBC (White Blood Cell Count) 5.8 4.1 - 10.2 103/uL   RBC (Red Blood Cell Count) 4.14 4.04 - 5.48 106/uL   Hemoglobin 12.9 12.0 - 15.0 gm/dL   Hematocrit 60.7 64.9 - 47.0 %   MCV (Mean Corpuscular Volume) 94.7 80.0 - 100.0 fl   MCH (Mean Corpuscular Hemoglobin) 31.2 27.0 - 31.2 pg   MCHC (Mean Corpuscular Hemoglobin Concentration) 32.9 32.0 - 36.0 gm/dL   Platelet Count 757 849 - 450 103/uL   RDW-CV (Red Cell Distribution Width) 12.9 11.6 - 14.8 %   MPV (Mean Platelet Volume) 8.8 (L) 9.4 - 12.4 fl   Neutrophils 2.93 1.50 - 7.80 103/uL   Lymphocytes 2.20 1.00 - 3.60 103/uL   Monocytes 0.45 0.00 - 1.50 103/uL   Eosinophils 0.12 0.00 - 0.55 103/uL   Basophils 0.07 0.00 - 0.09 103/uL   Neutrophil % 50.6 32.0 - 70.0 %   Lymphocyte % 38.0 10.0 - 50.0 %   Monocyte % 7.8 4.0 - 13.0 %   Eosinophil % 2.1 1.0 - 5.0 %   Basophil% 1.2 0.0 - 2.0 %   Immature Granulocyte %  0.3 <=0.7 %   Immature Granulocyte Count 0.02 <=0.06 10^3/L  Comprehensive Metabolic Panel (CMP)  Result Value Ref Range   Glucose 109 70 - 110 mg/dL   Sodium 860 863 - 854 mmol/L   Potassium 4.9 3.6 - 5.1 mmol/L   Chloride 101 97 - 109 mmol/L   Carbon Dioxide (CO2) 30.2 22.0 - 32.0 mmol/L   Urea Nitrogen (BUN) 22 7 - 25 mg/dL   Creatinine 1.0 0.6 - 1.1 mg/dL   Glomerular Filtration  Rate (eGFR) 56 (L) >60 mL/min/1.73sq m   Calcium  9.4 8.7 - 10.3 mg/dL   AST  26 8 - 39 U/L   ALT  23 5 - 38 U/L   Alk Phos (alkaline Phosphatase) 71 34 - 104 U/L   Albumin 4.5 3.5 - 4.8 g/dL   Bilirubin, Total 0.6 0.3 - 1.2 mg/dL   Protein, Total 6.8 6.1 - 7.9 g/dL   A/G Ratio 2.0 1.0 - 5.0 gm/dL  Hemoglobin J8R  Result Value Ref Range   Hemoglobin A1C 6.5 (H) 4.2 - 5.6 %   Average Blood Glucose (Calc) 140 mg/dL   Narrative   Normal Range:    4.2 - 5.6% Increased Risk:  5.7 - 6.4% Diabetes:        >= 6.5% Glycemic Control for adults with diabetes:  <7%    Microalbumin/Creatinine Ratio, Random Urine  Result Value Ref Range   Creatinine, Random Urine 51.5 37.0 - 250.0 mg/dL   Urine Albumin, Random 9   mg/L   Urine Albumin/Creatinine Ratio 17.5 <30.0 ug/mg  Vitamin D, 25-Hydroxy - [facility]  Result Value Ref Range   Vitamin D, 25-Hydroxy - [facility] 42.3 30.0 - 100.0 ng/mL   Narrative   Performed at:  [address] [employer] [address] [place], [place]  [id number] Lab Director: Frankey Sas MD, Phone:  [phone]  Urinalysis w/Microscopic  Result Value Ref Range   Color Light Yellow Colorless, Straw, Light Yellow, Yellow, Dark Yellow   Clarity Clear Clear   Specific Gravity 1.011 1.005 - 1.030   pH, Urine 5.0 5.0 - 8.0   Protein, Urinalysis Negative Negative mg/dL   Glucose, Urinalysis Negative Negative mg/dL   Ketones, Urinalysis Negative Negative mg/dL   Blood, Urinalysis Negative Negative   Nitrite, Urinalysis Negative Negative   Leukocyte Esterase, Urinalysis 1+ (!) Negative    Bilirubin, Urinalysis Negative Negative   Urobilinogen, Urinalysis 0.2 0.2 - 1.0 mg/dL   WBC, UA 6 (H) <=5 /hpf   Red Blood Cells, Urinalysis <1 <=3 /hpf   Bacteria, Urinalysis 0-5 0 - 5 /hpf   Squamous Epithelial Cells, Urinalysis 0 /hpf  Thyroid Stimulating-Hormone (TSH)  Result Value Ref Range   Thyroid Stimulating Hormone (TSH) 1.142 0.450-5.330 uIU/ml uIU/mL  Lipid Panel w/calc LDL  Result Value Ref Range   Cholesterol, Total 171 100 - 200 mg/dL   Triglyceride 892 35 - 199 mg/dL   HDL (High Density Lipoprotein) Cholesterol 64.4 35.0 - 85.0 mg/dL   LDL Calculated 85 0 - 130 mg/dL   VLDL Cholesterol 21 mg/dL   Cholesterol/HDL Ratio 2.7       ROS:  General: No fever, chills or recent illness. No change in weight  HEENT: No change in vision or hearing. No pain or difficulty with swallowing Respiratory: No cough or shortness of breath CV:  No chest pain or palpitations GI:  No pain, dyspepsia, chronic constipation issues GU:  No dysuria, frequency, or hesitancy MSK:  Low back pain and bilateral knee pain, chronic right shoulder pain Neurological: No headaches, changes in mental status, loss of sensation or strength   Psychological: No anxiety insomnia or depression  Objective:   Body mass index is 29.26 kg/m.  BP 136/58   Pulse 77   Ht 157.5 cm (5' 2)   Wt 72.6 kg (160 lb)   LMP  (LMP Unknown)   SpO2 97%   BMI 29.26 kg/m   General: WD/WN female, in no acute distress HEENT: Pupils equal and round, EOMI.  Neck: supple,  trachea midline; no thyromegaly Respiratory:clear to auscultation.  No dullness to percussion.  No use of accessory muscles. Cardiac:  Regular rate and rhythm without murmur, gallops, or rubs  Abdominal:soft, nontender, positive bowel sounds.  No organomegaly. Musculoskeletal:  No clubbing, cyanosis or edema.  Full range of motion in upper and lower extremities bilaterally. Neuro: CN grossly intact.  No acute decrease in sensation in the upper and  lower extremities bilaterally.  Lymph: no cervical or supraclavicular lymphadenopathy   Assessment/Plan:   Type 2 diabetes mellitus without complication, without long-term current use of insulin  (CMS/HHS-HCC)  (primary encounter diagnosis) Essential hypertension Stage 3a chronic kidney disease (CMS-HCC) MDD (major depressive disorder), recurrent episode, moderate (CMS-HCC) GERD without esophagitis  Assessment & Plan   Type 2 diabetes mellitus - Diabetes management is well-controlled with an A1c of 6.5%, below the target of 7%. - Continue current diabetes management plan. - Diabetic eye exam today - Low-carb diet. - Currently diet controlled without any medications.  2. Essential hypertension - Blood pressure is well-controlled with current medication regimen. - Continue current antihypertensive medications: amlodipine , hydralazine , and lisinopril . - Low-salt diet  3. Chronic constipation - Ongoing constipation with stool retention, confirmed by previous x-ray. -Especially because she takes hydrocodone  3 times a day - Recommended over-the-counter Miralax  daily for constipation management.  4. Chronic kidney disease, stage 3a -GFR looks improved - Avoid nephrotoxins - Hypertension and blood pressure control recommended - Monitor closely.  No NSAIDs which she has not been using.  5.  Chronic pain syndrome- - Following with pain clinic at [facility] - Continue duloxetine  and hydrocodone  as prescribed.     Follow-up with me in 5 months for annual physical as scheduled     Goals      * Lose Weight (pt-stated)        LAVENIA BEAVER, MD  Portions of this note were created using dictation software and may contain typographical errors.  *Some images could not be shown.

## 2024-04-27 ENCOUNTER — Emergency Department
Admission: EM | Admit: 2024-04-27 | Discharge: 2024-04-30 | Disposition: A | Attending: Emergency Medicine | Admitting: Emergency Medicine

## 2024-04-27 ENCOUNTER — Other Ambulatory Visit: Payer: Self-pay

## 2024-04-27 DIAGNOSIS — R45851 Suicidal ideations: Secondary | ICD-10-CM | POA: Insufficient documentation

## 2024-04-27 DIAGNOSIS — Z96643 Presence of artificial hip joint, bilateral: Secondary | ICD-10-CM | POA: Insufficient documentation

## 2024-04-27 DIAGNOSIS — Z96653 Presence of artificial knee joint, bilateral: Secondary | ICD-10-CM | POA: Insufficient documentation

## 2024-04-27 DIAGNOSIS — F32A Depression, unspecified: Secondary | ICD-10-CM

## 2024-04-27 DIAGNOSIS — N183 Chronic kidney disease, stage 3 unspecified: Secondary | ICD-10-CM | POA: Insufficient documentation

## 2024-04-27 DIAGNOSIS — I2510 Atherosclerotic heart disease of native coronary artery without angina pectoris: Secondary | ICD-10-CM | POA: Insufficient documentation

## 2024-04-27 DIAGNOSIS — I129 Hypertensive chronic kidney disease with stage 1 through stage 4 chronic kidney disease, or unspecified chronic kidney disease: Secondary | ICD-10-CM | POA: Insufficient documentation

## 2024-04-27 DIAGNOSIS — E1122 Type 2 diabetes mellitus with diabetic chronic kidney disease: Secondary | ICD-10-CM | POA: Insufficient documentation

## 2024-04-27 DIAGNOSIS — F332 Major depressive disorder, recurrent severe without psychotic features: Secondary | ICD-10-CM

## 2024-04-27 LAB — CBC WITH DIFFERENTIAL/PLATELET
Abs Immature Granulocytes: 0.01 10*3/uL (ref 0.00–0.07)
Basophils Absolute: 0.1 10*3/uL (ref 0.0–0.1)
Basophils Relative: 2 %
Eosinophils Absolute: 0.1 10*3/uL (ref 0.0–0.5)
Eosinophils Relative: 3 %
HCT: 39.9 % (ref 36.0–46.0)
Hemoglobin: 13.1 g/dL (ref 12.0–15.0)
Immature Granulocytes: 0 %
Lymphocytes Relative: 46 %
Lymphs Abs: 1.9 10*3/uL (ref 0.7–4.0)
MCH: 31.2 pg (ref 26.0–34.0)
MCHC: 32.8 g/dL (ref 30.0–36.0)
MCV: 95 fL (ref 80.0–100.0)
Monocytes Absolute: 0.2 10*3/uL (ref 0.1–1.0)
Monocytes Relative: 6 %
Neutro Abs: 1.7 10*3/uL (ref 1.7–7.7)
Neutrophils Relative %: 43 %
Platelets: 196 10*3/uL (ref 150–400)
RBC: 4.2 MIL/uL (ref 3.87–5.11)
RDW: 12.6 % (ref 11.5–15.5)
WBC: 4.1 10*3/uL (ref 4.0–10.5)
nRBC: 0 % (ref 0.0–0.2)

## 2024-04-27 LAB — URINALYSIS, ROUTINE W REFLEX MICROSCOPIC
Bilirubin Urine: NEGATIVE
Glucose, UA: NEGATIVE mg/dL
Hgb urine dipstick: NEGATIVE
Ketones, ur: NEGATIVE mg/dL
Nitrite: NEGATIVE
Protein, ur: NEGATIVE mg/dL
RBC / HPF: 0 RBC/hpf (ref 0–5)
Specific Gravity, Urine: 1.021 (ref 1.005–1.030)
pH: 5 (ref 5.0–8.0)

## 2024-04-27 LAB — COMPREHENSIVE METABOLIC PANEL WITH GFR
ALT: 22 U/L (ref 0–44)
AST: 29 U/L (ref 15–41)
Albumin: 4.6 g/dL (ref 3.5–5.0)
Alkaline Phosphatase: 76 U/L (ref 38–126)
Anion gap: 12 (ref 5–15)
BUN: 27 mg/dL — ABNORMAL HIGH (ref 8–23)
CO2: 25 mmol/L (ref 22–32)
Calcium: 9.5 mg/dL (ref 8.9–10.3)
Chloride: 103 mmol/L (ref 98–111)
Creatinine, Ser: 1.19 mg/dL — ABNORMAL HIGH (ref 0.44–1.00)
GFR, Estimated: 45 mL/min — ABNORMAL LOW
Glucose, Bld: 133 mg/dL — ABNORMAL HIGH (ref 70–99)
Potassium: 4.4 mmol/L (ref 3.5–5.1)
Sodium: 140 mmol/L (ref 135–145)
Total Bilirubin: 0.6 mg/dL (ref 0.0–1.2)
Total Protein: 7 g/dL (ref 6.5–8.1)

## 2024-04-27 LAB — URINE DRUG SCREEN
Amphetamines: NEGATIVE
Barbiturates: NEGATIVE
Benzodiazepines: NEGATIVE
Cocaine: NEGATIVE
Fentanyl: NEGATIVE
Methadone Scn, Ur: NEGATIVE
Opiates: POSITIVE — AB
Tetrahydrocannabinol: NEGATIVE

## 2024-04-27 LAB — RESP PANEL BY RT-PCR (RSV, FLU A&B, COVID)  RVPGX2
Influenza A by PCR: NEGATIVE
Influenza B by PCR: NEGATIVE
Resp Syncytial Virus by PCR: NEGATIVE
SARS Coronavirus 2 by RT PCR: NEGATIVE

## 2024-04-27 LAB — ETHANOL: Alcohol, Ethyl (B): <15 mg/dL

## 2024-04-27 NOTE — ED Notes (Signed)
 Hospital meal provided, pt tolerated w/o complaints.  Waste discarded appropriately.

## 2024-04-27 NOTE — ED Notes (Signed)
 Pt belongings collected at this time.   Includes: 1 throw blanket 1 night dress A pair of shoes Green purse Phone and wallet and meds inside purse X2 necklaces X3 rings

## 2024-04-27 NOTE — ED Notes (Signed)
 This RN called and spoke to [facility] and provided all information to them about accepting patient. They will call back.

## 2024-04-27 NOTE — Consult Note (Signed)
 Patient has been accepted to [facility]  Patient assigned to Unit [facility].  Accepting physician is Dr. Mabeline Raveling Call report to [phone]  Representative was Tiffany     ER Staff is aware of it:  Lyndy, ER Secretary  Dr. Ernest, ER MD  Harlene, Patient's Nurse     Patient's Family/Support System Lessie Knee (Niece) [phone] ) have been updated as well.

## 2024-04-27 NOTE — ED Notes (Signed)
 Pt ask for another pair of pants as when she used the bathroom she accidentally got urine on her pants, provided.

## 2024-04-27 NOTE — BH Assessment (Addendum)
 Comprehensive Clinical Assessment (CCA) Screening, Triage and Referral Note  04/27/2024 Jean Stout [id number]  Chief Complaint:  Chief Complaint  Patient presents with   Depression   Visit Diagnosis: Depression   Special is an 84y.o. widowed female who presents to [facility] ED voluntarily for treatment. Per triage note, Pt in via [facility] coming from home c/o depression x2 weeks with today feeling the worst. Pt denies any intent to harm herself but does endorse having thoughts. Pt states that she thinks this is from her roboxan that she has been taking. Per pt she has been on it for a while and she has not missed any doses. Pt tearful at bedside but cooperative.  During TTS assessment pt presents alert and oriented x 4, tearul but cooperative, and depressed mood-congruent with affect. The pt does not appear to be responding to internal or external stimuli. Neither is the pt presenting with any delusional thinking. Pt verified the information provided to triage RN.  Pt identifies her main complaint to be something is wrong with my mind. Patient reports for the past two weeks she has been depressed and experiencing anhedonia. She states I was so happy and now its gone. She doesn't know what contributed to this onset of depression. She denies SI/HI/AVH but also states I am just ready to be in heaven. When asked does she want to harm herself she stated she is Christian and does not think she would.  She denies any hx of suicide attempts or plans.   She reports she has 3 children who she isn't close with. 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 She reports she hasn't been able to get much sleep only getting two hours despite laying down for hours. She also reports snot eating much just milk and oranges. She denies receiving any current MH services. She reports previously having a psychiatrist but has been getting  medication management from her MD.   She denies any hx of trauma or abuse. She provided her niece Jean Stout as her collateral and gave permission to call her at [phone].   Patient Reported Information How did you hear about us ? Self  What Is the Reason for Your Visit/Call Today? Depression  How Long Has This Been Causing You Problems? 1 wk - 1 month  What Do You Feel Would Help You the Most Today? Treatment for Depression or other mood problem   Have You Recently Had Any Thoughts About Hurting Yourself? No  Are You Planning to Commit Suicide/Harm Yourself At This time? No   Have you Recently Had Thoughts About Hurting Someone Jean Stout? No  Are You Planning to Harm Someone at This Time? No  Explanation: No data recorded  Have You Used Any Alcohol or Drugs in the Past 24 Hours? No  How Long Ago Did You Use Drugs or Alcohol? No data recorded What Did You Use and How Much? No data recorded  Do You Currently Have a Therapist/Psychiatrist? No  Name of Therapist/Psychiatrist: No data recorded  Have You Been Recently Discharged From Any Office Practice or Programs? No data recorded Explanation of Discharge From Practice/Program: No data recorded   CCA Screening Triage Referral Assessment Type of Contact: Face-to-Face  Telemedicine Service Delivery:   Is this Initial or Reassessment?   Date Telepsych consult ordered in [facility]:    Time Telepsych consult ordered in [facility]:    Location of Assessment: [facility]  Provider Location: [facility] ED    Collateral Involvement: Collateral support from Niece Jean Stout ([phone]. She reports when she spoke with her aunt her voice was hodges and she asked her what was wrong and she reported being lonesome and sounded confused. She stated that Jean Stout told her she doesn't feel right, and that something is wrong. Jean says Jean Stout is usually upbeat and a go getter. Jean Stout takes care of her twin sister. She arranges her own transportation  or she will walk miles to see her in the nursing home [facility]). She reports her aunts doctors said that she had white cells in her urine when she went for a check up so she might have a UTI. Jean is concerned that the hydrocodone  may be an addiction, she is also on Robaxin  muscle relaxer. Jean said she was prescribed due to her hip pain.   Does Patient Have a Automotive Engineer Guardian? No data recorded Name and Contact of Legal Guardian: No data recorded If Minor and Not Living with Parent(s), Who has Custody? No data recorded Is CPS involved or ever been involved? No data recorded Is APS involved or ever been involved? No data recorded  Patient Determined To Be At Risk for Harm To Self or Others Based on Review of Patient Reported Information or Presenting Complaint? No data recorded Method: No data recorded Availability of Means: No data recorded Intent: No data recorded Notification Required: No data recorded Additional Information for Danger to Others Potential: No data recorded Additional Comments for Danger to Others Potential: No data recorded Are There Guns or Other Weapons in Your Home? No data recorded Types of Guns/Weapons: No data recorded Are These Weapons Safely Secured?                            No data recorded Who Could Verify You Are Able To Have These Secured: No data recorded Do You Have any Outstanding Charges, Pending Court Dates, Parole/Probation? No data recorded Contacted To Inform of Risk of Harm To Self or Others: No data recorded  Does Patient Present under Involuntary Commitment? No data recorded   [place] of Residence: [place]   Patient Currently Receiving the Following Services: No data recorded  Determination of Need: Emergent (2 hours)   Options For Referral: No data recorded  Disposition Recommendation per psychiatric provider: We recommend inpatient psychiatric hospitalization when medically cleared. Patient is under voluntary  admission at this time.  Jamara Vary E Cherril Hett

## 2024-04-27 NOTE — ED Notes (Signed)
 Pt was given a snack , chocolate ice cream and water.

## 2024-04-27 NOTE — ED Triage Notes (Signed)
 Pt in via [facility] coming from home c/o depression x2 weeks with today feeling the worst. Pt denies any intent to harm herself but does endorse having thoughts. Pt states th\at she thinks this is from her roboxan that she has been taking. Per pt she has been on it for a while and she has not missed any doses. Pt tearful at bedside but cooperative.

## 2024-04-27 NOTE — ED Notes (Addendum)
 Pt reminded of need for urine sample at this time. Pt stating to this nurse that she is wanting to leave. This RN explained to pt that psych had made the decision that she seek further care with inpatient psych. MD made aware.

## 2024-04-27 NOTE — ED Provider Notes (Addendum)
 "  [facility] Provider Note    Event Date/Time   First MD Initiated Contact with Patient 04/27/24 1026     (approximate)   History   No chief complaint on file.   HPI  Jean Stout is a 84 y.o. female with diabetes who comes in with concerns for depression over the past 2 weeks.  Patient reports having thoughts of wanting to harm herself.  She denies any missed doses of medications.  She denies any medical concerns or self.  She starts crying state that she would never hurt herself if when I asked her about if she had a plan.  Physical Exam   Triage Vital Signs: ED Triage Vitals  Encounter Vitals Group     BP      Girls Systolic BP Percentile      Girls Diastolic BP Percentile      Boys Systolic BP Percentile      Boys Diastolic BP Percentile      Pulse      Resp      Temp      Temp src      SpO2      Weight      Height      Head Circumference      Peak Flow      Pain Score      Pain Loc      Pain Education      Exclude from Growth Chart     Most recent vital signs: Vitals:   04/27/24 1026  BP: 134/80  Pulse: 78  Resp: 18  Temp: 98.7 F (37.1 C)  SpO2: 97%     General: Awake, no distress.  CV:  Good peripheral perfusion.  Resp:  Normal effort.  Abd:  No distention.  Other:  Positive SI without a plan   ED Results / Procedures / Treatments   Labs (all labs ordered are listed, but only abnormal results are displayed) Labs Reviewed  COMPREHENSIVE METABOLIC PANEL WITH GFR - Abnormal; Notable for the following components:      Result Value   Glucose, Bld 133 (*)    BUN 27 (*)    Creatinine, Ser 1.19 (*)    GFR, Estimated 45 (*)    All other components within normal limits  ETHANOL  CBC WITH DIFFERENTIAL/PLATELET  URINE DRUG SCREEN  SALICYLATE LEVEL  ACETAMINOPHEN  LEVEL  URINALYSIS, ROUTINE W REFLEX MICROSCOPIC     RADIOLOGY I have reviewed the xray personally and interpreted    PROCEDURES:  Critical Care  performed: No  Procedures   MEDICATIONS ORDERED IN ED: Medications - No data to display   IMPRESSION / MDM / ASSESSMENT AND PLAN / ED COURSE  I reviewed the triage vital signs and the nursing notes.   Patient's presentation is most consistent with acute presentation with potential threat to life or bodily function.   Pt is without any acute medical complaints. No exam findings to suggest medical cause of current presentation. Will order psychiatric screening labs and discuss further w/ psychiatric service.  D/d includes but is not limited to psychiatric disease, behavioral/personality disorder, inadequate socioeconomic support, medical.  Based on HPI, exam, unremarkable labs, no concern for acute medical problem at this time. No rigidity, clonus, hyperthermia, focal neurologic deficit, diaphoresis, tachycardia, meningismus, ataxia, gait abnormality or other finding to suggest this visit represents a non-psychiatric problem. Screening labs reviewed.    Given this, pt medically cleared, to be dispositioned per Psych.  The patient has been placed in psychiatric observation due to the need to provide a safe environment for the patient while obtaining psychiatric consultation and evaluation, as well as ongoing medical and medication management to treat the patient's condition.  The patient has not been placed under full IVC at this time.    Family was requesting that a urine be checked given she had maybe had some symptoms of UTI so this was also ordered.     FINAL CLINICAL IMPRESSION(S) / ED DIAGNOSES   Final diagnoses:  Depression, unspecified depression type     Rx / DC Orders   ED Discharge Orders     None        Note:  This document was prepared using Dragon voice recognition software and may include unintentional dictation errors.   Ernest Ronal BRAVO, MD 04/27/24 1422    Ernest Ronal BRAVO, MD 04/27/24 1422  "

## 2024-04-27 NOTE — ED Notes (Signed)
Provided pt dinner tray.

## 2024-04-28 MED ORDER — FERROUS SULFATE 325 (65 FE) MG PO TABS
325.0000 mg | ORAL_TABLET | Freq: Every day | ORAL | Status: DC
  Filled 2024-04-28 (×3): qty 1

## 2024-04-28 MED ORDER — AMLODIPINE BESYLATE 5 MG PO TABS
10.0000 mg | ORAL_TABLET | Freq: Every day | ORAL | Status: DC
  Administered 2024-04-28 – 2024-04-30 (×3): 10 mg via ORAL
  Filled 2024-04-28 (×3): qty 2

## 2024-04-28 MED ORDER — ATORVASTATIN CALCIUM 20 MG PO TABS
40.0000 mg | ORAL_TABLET | Freq: Every day | ORAL | Status: DC
  Administered 2024-04-28 – 2024-04-29 (×2): 40 mg via ORAL
  Filled 2024-04-28 (×2): qty 2

## 2024-04-28 MED ORDER — MELATONIN 5 MG PO TABS
2.5000 mg | ORAL_TABLET | Freq: Every day | ORAL | Status: DC
  Administered 2024-04-28 – 2024-04-29 (×2): 2.5 mg via ORAL
  Filled 2024-04-28 (×2): qty 1

## 2024-04-28 MED ORDER — FUROSEMIDE 40 MG PO TABS
20.0000 mg | ORAL_TABLET | Freq: Every day | ORAL | Status: DC
  Filled 2024-04-28 (×3): qty 1

## 2024-04-28 MED ORDER — ASPIRIN 81 MG PO TBEC
81.0000 mg | DELAYED_RELEASE_TABLET | Freq: Every day | ORAL | Status: DC
  Administered 2024-04-28 – 2024-04-30 (×3): 81 mg via ORAL
  Filled 2024-04-28 (×3): qty 1

## 2024-04-28 MED ORDER — DULOXETINE HCL 30 MG PO CPEP
30.0000 mg | ORAL_CAPSULE | Freq: Three times a day (TID) | ORAL | Status: DC
  Administered 2024-04-28 – 2024-04-29 (×4): 30 mg via ORAL
  Filled 2024-04-28 (×4): qty 1

## 2024-04-28 MED ORDER — HYDRALAZINE HCL 50 MG PO TABS
50.0000 mg | ORAL_TABLET | Freq: Three times a day (TID) | ORAL | Status: DC
  Administered 2024-04-28 – 2024-04-30 (×4): 50 mg via ORAL
  Filled 2024-04-28 (×7): qty 1

## 2024-04-28 MED ORDER — HYDROCODONE-ACETAMINOPHEN 7.5-325 MG PO TABS
1.0000 | ORAL_TABLET | Freq: Three times a day (TID) | ORAL | Status: DC | PRN
  Administered 2024-04-28 – 2024-04-30 (×6): 1 via ORAL
  Filled 2024-04-28 (×6): qty 1

## 2024-04-28 MED ORDER — LISINOPRIL 20 MG PO TABS
40.0000 mg | ORAL_TABLET | Freq: Every day | ORAL | Status: DC
  Administered 2024-04-28 – 2024-04-30 (×3): 40 mg via ORAL
  Filled 2024-04-28: qty 2
  Filled 2024-04-28 (×2): qty 4

## 2024-04-28 NOTE — ED Notes (Signed)
 Pt is requesting her pills. MD made aware she needs a Med Rec for home meds.

## 2024-04-28 NOTE — Consult Note (Cosign Needed)
  Psychiatric Consult Follow-up  Patient Name: .Jean Stout  MRN: [id number]  DOB: September 24, 1940  Consult Order details:  Orders (From admission, onward)     Start     Ordered   04/27/24 1035  CONSULT TO CALL ACT TEAM       Ordering Provider: Ernest Ronal BRAVO, MD  Provider:  (Not yet assigned)  Question:  Reason for Consult?  Answer:  Psych consult   04/27/24 1034   04/27/24 1035  IP CONSULT TO PSYCHIATRY       Ordering Provider: Ernest Ronal BRAVO, MD  Provider:  (Not yet assigned)  Question:  Reason for consult:  Answer:  Medication management   04/27/24 1034             Mode of Visit: In person    Psychiatry Consult Evaluation  Service Date: April 28, 2024 LOS:  LOS: 0 days  Chief Complaint I want to go home  Primary Psychiatric Diagnoses  Major Depression, severe   Assessment  CLINICAL DECISION MAKING:  Initial Consult Jean Stout is a 84 y.o. female admitted: Presented to the EDfor 04/27/2024 10:24 AM for increasing depression. She carries the psychiatric diagnoses of Major Depressive Disorder and has a past medical history of  Chronic pain and HTN. Patient's presentation is sad and tearful. She is alert and oriented x4 and does not appear to be in any acute distress. Patient explains that over the past few weeks she has been experiencing increased depression, feelings of loneliness, hopelessness. She relates this feelings to when robaxin  for chronic back pain was increased. Also has been under the care of pain management for severe arthritis of the spine with last epidural injection about 3-4 weeks ago.    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 This was after living with her son in [place]  for 3 months and then with her daughter in [place]  for 8 months. When asked the cause of her moves, she becomes more tearful and states they didn't want me around anymore. Although she has enjoyed living in her own  space, she admits that since the winter weather, she has felt more isolated, stating that the apartment complex does not make provisions for seniors and did not expound any further.  She denies suicidal or homicidal thoughts. Denies hx of psychiatric hospitalization or suicide attempts. Denies delusional thoughts and does not appear to be internally preoccupied.    04/28/2024: Patient is assessed this morning in the ED. According to staff, she was moved from the hallway due to communication with a family member that was under care in the ED. Staff reports that she became increasingly tearful and depressed during this encounter. This morning she states that she would like to return home and continues to endorse depression and is very tearful. UA negative for infection. Robaxin  has continued to be held. She did not receive duloxetine  yesterday and was restarted this morning. There is concern that she if discharge, she will continue to decompensate given lack of social supports, transportation, and chronic pain. She denies suicidal or homicidal ideation, hallucinations, or delusions at this time. Currently accepted to [facility] and she is upset regarding this stating that she does not want to leave her sister whom is in a nursing home locally.    Diagnoses:  Active Hospital problems: Active Problems:   Severe recurrent major depression without psychotic features (HCC)    Plan   ##  Disposition: Recommend inpatient admission for stabilizing depression ## Psychiatric Medication Recommendations:  Continue duloxetine , further changes referred to receiving facility  ## Medical Decision Making Capacity: Not specifically addressed in this encounter  ## Further Work-up:  EKG- QTC: Labs:  ## Behavioral / Environmental: -Utilize compassion and acknowledge the patient's experiences while setting clear and realistic expectations for care.    ## Safety and Observation Level:  - Based on my clinical  evaluation, I estimate the patient to be at Low risk of self harm in the current setting. - At this time, we recommend  routine. This decision is based on my review of the chart including patient's history and current presentation, interview of the patient, mental status examination, and consideration of suicide risk including evaluating suicidal ideation, plan, intent, suicidal or self-harm behaviors, risk factors, and protective factors. This judgment is based on our ability to directly address suicide risk, implement suicide prevention strategies, and develop a safety plan while the patient is in the clinical setting. Please contact our team if there is a concern that risk level has changed.  CSSR Risk Category:C-SSRS RISK CATEGORY:  Flowsheet Row ED from 04/27/2024 in [facility] Emergency Department at [facility] Visit from 12/06/2022 in confidential department ED from 09/04/2021 in [facility] Emergency Department at [facility]  C-SSRS RISK CATEGORY No Risk No Risk No Risk     Suicide Risk Assessment: Patient has following modifiable risk factors for suicide: untreated depression, social isolation, current symptoms: anxiety/panic, insomnia, impulsivity, anhedonia, hopelessness, triggering events, and pain, medical illness (ie new dx of cancer), which we are addressing by medication stabilization, ensuring a safe environment, and therapeutic communication.  Patient has following non-modifiable or demographic risk factors for suicide: none  Patient has the following protective factors against suicide: Cultural, spiritual, or religious beliefs that discourage suicide, no history of suicide attempts, and no history of NSSIB  Thank you for this consult request. Recommendations have been communicated to the primary team.  We will recommend inpatient admission at this time.       History of Present Illness  Relevant Aspects of Hospital ED   Patient Report:   Initial  Consult Jean Stout is a 84 y.o. female admitted: Presented to the EDfor 04/27/2024 10:24 AM for increasing depression. She carries the psychiatric diagnoses of Major Depressive Disorder and has a past medical history of  Chronic pain and HTN. Patient's presentation is sad and tearful. She is alert and oriented x4 and does not appear to be in any acute distress. Patient explains that over the past few weeks she has been experiencing increased depression, feelings of loneliness, hopelessness. She relates this feelings to when robaxin  for chronic back pain was increased. Also has been under the care of pain management for severe arthritis of the spine with last epidural injection about 3-4 weeks ago.    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 This was after living with her son in [place]  for 3 months and then with her daughter in [place]  for 8 months. When asked the cause of her moves, she becomes more tearful and states they didn't want me around anymore. Although she has enjoyed living in her own space, she admits that since the winter weather, she has felt more isolated, stating that the apartment complex does not make provisions for seniors and did not expound any further.  She denies suicidal or homicidal thoughts. Denies hx of  psychiatric hospitalization or suicide attempts. Denies delusional thoughts and does not appear to be internally preoccupied.    04/28/2024: Patient is assessed this morning in the ED. According to staff, she was moved from the hallway due to communication with a family member that was under care in the ED. Staff reports that she became increasingly tearful and depressed during this encounter. This morning she states that she would like to return home and continues to endorse depression and is very tearful. UA negative for infection. Robaxin  has continued to be held. She did not receive duloxetine  yesterday and was  restarted this morning. There is concern that she if discharge, she will continue to decompensate given lack of social supports, transportation, and chronic pain. She denies suicidal or homicidal ideation, hallucinations, or delusions at this time. Currently accepted to [facility] and she is upset regarding this stating that she does not want to leave her sister whom is in a nursing home locally.    Psychiatric and Social History  Psychiatric History:  Information collected from patient/chart  Prev Dx/Sx:  Current Psych Provider:  Home Meds (current):  Previous Med Trials:  Therapy:  Prior Psych Hospitalization:  Prior Suicide attempt/Self Harm:  Prior Violence:   Family Psych History:  Family Hx suicide:   Social History:  Educational Hx:  Occupational Hx:  Legal Hx:  Living Situation:  Spiritual Hx:  Access to weapons/lethal means: denies   Substance History Alcohol:  Last Drink : Number of drinks per day : History of alcohol withdrawal seizures: History of DT's: Tobacco:  Illicit drugs:  Prescription drug abuse:  Rehab hx:   Exam Findings  Physical Exam: Reviewed and agree with the physical exam findings conducted by the medical provider Vital Signs:  Temp:  [98.4 F (36.9 C)-98.7 F (37.1 C)] 98.7 F (37.1 C) (02/01 0812) Pulse Rate:  [71-75] 75 (02/01 0812) Resp:  [15-18] 15 (02/01 0812) BP: (152-165)/(69-85) 152/85 (02/01 0812) SpO2:  [94 %-98 %] 94 % (02/01 0812) Blood pressure (!) 152/85, pulse 75, temperature 98.7 F (37.1 C), temperature source Oral, resp. rate 15, height 5' (1.524 m), weight 65 kg, SpO2 94%. Body mass index is 27.99 kg/m.    Mental Status Exam: General Appearance: Neat  Orientation:  Full (Time, Place, and Person)  Memory:  Immediate;   Good Recent;   Good Remote;   Good  Concentration:  Concentration: Fair and Attention Span: Fair  Recall:  Good  Attention  Fair  Eye Contact:  Minimal  Speech:  Normal Rate  Language:   Good  Volume:  Normal  Mood: depressed  Affect:  Depressed and Tearful  Thought Process:  Coherent  Thought Content:  Logical  Suicidal Thoughts:  No  Homicidal Thoughts:  No  Judgement:  Impaired  Insight:  Lacking  Psychomotor Activity:  Psychomotor Retardation  Akathisia:  No  Fund of Knowledge:  Good      Assets:  Communication Skills Desire for Improvement Financial Resources/Insurance Housing Vocational/Educational  Cognition:  WNL  ADL's:  Intact  AIMS (if indicated):        Other History   These have been pulled in through the EMR, reviewed, and updated if appropriate.  Family History:  The patient's family history includes Anxiety disorder in her mother; Bipolar disorder in her sister; Cancer in her daughter; Heart attack in her father and son; Hyperlipidemia in her son; Hypertension in her mother and son; Sleep apnea in her son; Sudden Cardiac Death (age of onset: 24)  in her father; Sudden Cardiac Death (age of onset: 61) in her brother.  Medical History: Past Medical History:  Diagnosis Date   Anxiety and depression    Aortic stenosis    Arthritis    Cardiac murmur    a.) Grade I/VI medium pitched mid systolic blowing at lower LSB   CKD (chronic kidney disease), stage III (HCC)    Coronary artery disease    GERD (gastroesophageal reflux disease)    History of 2019 novel coronavirus disease (COVID-19) 12/29/2020   a.) asymptomatic; PCR (+) on 12/29/2020 with confirmatory (+) rapid PCR on 12/30/2020.   History of MI (myocardial infarction) 2010   HLD (hyperlipidemia)    HTN (hypertension)    IDA (iron deficiency anemia)    Mild mitral regurgitation    [facility]ocardial [facility] 2010   a.) Tx'd at [facility] ; PCI with stents x 2 (unknown type and location).   OSA on CPAP    a.) not complaint with prescribed nocturnal PAP therapy   Osteopenia    PONV (postoperative nausea and vomiting)    T2DM (type 2 diabetes mellitus) (HCC)    Vaginal  prolapse     Surgical History: Past Surgical History:  Procedure Laterality Date   CAROTID STENT     CARPAL TUNNEL RELEASE Left 1983   CORONARY ANGIOPLASTY WITH STENT PLACEMENT N/A 2010   stents x 2 placed (unknown type and location); Location: [facility]    CYST EXCISION  1984   from Left side of neck   EYE SURGERY     JOINT REPLACEMENT Bilateral    hips & knees   KNEE CLOSED REDUCTION Right 05/27/2021   Procedure: CLOSED MANIPULATION KNEE;  Surgeon: Kathlynn Sharper, MD;  Location: [facility] ORS;  Service: Orthopedics;  Laterality: Right;   KYPHOPLASTY N/A 09/08/2020   Procedure: L2  KYPHOPLASTY;  Surgeon: Kathlynn Sharper, MD;  Location: [facility] OR[facility];  Service: Orthopedics;  Laterality: N/A;   TOTAL HIP ARTHROPLASTY Right 03/2005   TOTAL HIP ARTHROPLASTY Left 05/2005   TOTAL KNEE ARTHROPLASTY Left 01/12/2021   Procedure: TOTAL KNEE ARTHROPLASTY;  Surgeon: Kathlynn Sharper, MD;  Location: [facility] ORS;  Service: Orthopedics;  Laterality: Left;   TOTAL KNEE ARTHROPLASTY Right 04/27/2021   Procedure: TOTAL KNEE ARTHROPLASTY;  Surgeon: Kathlynn Sharper, MD;  Location: [facility] ORS;  Service: Orthopedics;  Laterality: Right;     Medications:  Current Medications[1]  Allergies: Allergies[2]  Stanislaus Kaltenbach B Aditya Nastasi, NP     [1]  Current Facility-Administered Medications:    amLODipine  (NORVASC ) tablet 10 mg, 10 mg, Oral, Daily, Cyrena Mylar, MD, 10 mg at 04/28/24 1016   aspirin  EC tablet 81 mg, 81 mg, Oral, Daily, Cyrena Mylar, MD, 81 mg at 04/28/24 1018   atorvastatin  (LIPITOR) tablet 40 mg, 40 mg, Oral, QHS, Cyrena Mylar, MD   DULoxetine  (CYMBALTA ) DR capsule 30 mg, 30 mg, Oral, TID, Mumma, Shannon, MD, 30 mg at 04/28/24 1123   ferrous sulfate  tablet 325 mg, 325 mg, Oral, Q breakfast, Cyrena Mylar, MD   furosemide  (LASIX ) tablet 20 mg, 20 mg, Oral, Daily, Cyrena Mylar, MD   hydrALAZINE  (APRESOLINE ) tablet 50 mg, 50 mg, Oral, TID, Cyrena Mylar, MD, 50 mg at 04/28/24 1017   HYDROcodone -acetaminophen  (NORCO)  7.5-325 MG per tablet 1 tablet, 1 tablet, Oral, Q8H PRN, Cyrena Mylar, MD, 1 tablet at 04/28/24 1017   lisinopril  (ZESTRIL ) tablet 40 mg, 40 mg, Oral, Daily, Cyrena Mylar, MD, 40 mg at 04/28/24 1018  Current Outpatient Medications:    amLODipine  (NORVASC )  5 MG tablet, Take 5 mg by mouth daily. (Patient taking differently: Take 10 mg by mouth daily.), Disp: , Rfl:    aspirin  EC 81 MG tablet, Take 81 mg by mouth., Disp: , Rfl:    atorvastatin  (LIPITOR) 40 MG tablet, Take 40 mg by mouth at bedtime., Disp: , Rfl:    cholecalciferol  (VITAMIN D3) 25 MCG (1000 UNIT) tablet, Take 1,000 Units by mouth daily., Disp: , Rfl:    diclofenac Sodium (VOLTAREN) 1 % GEL, Apply 1 application topically 3 (three) times daily as needed (pain)., Disp: , Rfl:    DULoxetine  (CYMBALTA ) 30 MG capsule, Take 30 mg by mouth 3 (three) times daily., Disp: , Rfl:    ferrous sulfate  325 (65 FE) MG tablet, Take 325 mg by mouth., Disp: , Rfl:    furosemide  (LASIX ) 20 MG tablet, Take 20 mg by mouth., Disp: , Rfl:    glucosamine-chondroitin 500-400 MG tablet, Take 2 tablets by mouth daily., Disp: , Rfl:    hydrALAZINE  (APRESOLINE ) 50 MG tablet, Take 50 mg by mouth 3 (three) times daily., Disp: , Rfl:    HYDROcodone -acetaminophen  (NORCO) 7.5-325 MG tablet, Take 1 tablet by mouth every 8 (eight) hours as needed for moderate pain (pain score 4-6)., Disp: 90 tablet, Rfl: 0   lisinopril  (PRINIVIL ,ZESTRIL ) 40 MG tablet, Take 40 mg by mouth daily. , Disp: , Rfl:    Multiple Vitamins-Minerals (MULTIVITAMIN WITH MINERALS) tablet, Take 1 tablet by mouth daily., Disp: , Rfl:    omeprazole (PRILOSEC) 20 MG capsule, Take 20 mg by mouth daily. (Patient taking differently: Take 40 mg by mouth daily.), Disp: , Rfl:    Turmeric 400 MG CAPS, Take by mouth daily., Disp: , Rfl:    DULoxetine  (CYMBALTA ) 60 MG capsule, Take 90 mg by mouth. (Patient not taking: Reported on 04/27/2024), Disp: , Rfl:    [START ON 05/04/2024] HYDROcodone -acetaminophen  (NORCO)  7.5-325 MG tablet, Take 1 tablet by mouth every 8 (eight) hours as needed for moderate pain (pain score 4-6)., Disp: 90 tablet, Rfl: 0   [START ON 06/03/2024] HYDROcodone -acetaminophen  (NORCO) 7.5-325 MG tablet, Take 1 tablet by mouth every 8 (eight) hours as needed for moderate pain (pain score 4-6)., Disp: 90 tablet, Rfl: 0 [2]  Allergies Allergen Reactions   Gadolinium Derivatives Rash   Amlodipine  Besylate Itching   Hm Lidocaine  Patch [Lidocaine ] Itching   Iodinated Contrast Media Itching   Lotrel [Amlodipine  Besy-Benazepril Hcl] Itching   Bextra [Valdecoxib] Rash

## 2024-04-28 NOTE — ED Notes (Signed)
 Patient given breakfast tray.

## 2024-04-28 NOTE — ED Provider Notes (Signed)
 Emergency Medicine Observation Re-evaluation Note   BP (!) 165/69 (BP Location: Left Arm)   Pulse 71   Temp 98.4 F (36.9 C) (Oral)   Resp 18   Ht 5' (1.524 m)   Wt 65 kg   SpO2 98%   BMI 27.99 kg/m    ED Course / MDM   No reported events during my shift at the time of this note.   Pt is awaiting dispo from consultants   Ginnie Shams MD    Shams Ginnie, MD 04/28/24 0400

## 2024-04-28 NOTE — ED Notes (Signed)
 Pt given snack.

## 2024-04-28 NOTE — ED Notes (Signed)
 Patient is IVC pending placement make sure this patient is going to [facility] or [facility]

## 2024-04-28 NOTE — ED Notes (Signed)
 Patient requesting tray for her food. Tray placed in patients room

## 2024-04-28 NOTE — ED Notes (Signed)
Patients head of bed adjusted

## 2024-04-28 NOTE — ED Notes (Signed)
 Pt ABCs intact. RR even and unlabored. Pt in NAD. Bed in lowest locked position. Call bell in reach. Denies needs at this time.    Past Medical History:  Diagnosis Date   Anxiety and depression    Aortic stenosis    Arthritis    Cardiac murmur    a.) Grade I/VI medium pitched mid systolic blowing at lower LSB   CKD (chronic kidney disease), stage III (HCC)    Coronary artery disease    GERD (gastroesophageal reflux disease)    History of 2019 novel coronavirus disease (COVID-19) 12/29/2020   a.) asymptomatic; PCR (+) on 12/29/2020 with confirmatory (+) rapid PCR on 12/30/2020.   History of MI (myocardial infarction) 2010   HLD (hyperlipidemia)    HTN (hypertension)    IDA (iron deficiency anemia)    Mild mitral regurgitation    Myocardial infarction [facility]) 2010   a.) Tx'd at [facility] ; PCI with stents x 2 (unknown type and location).   OSA on CPAP    a.) not complaint with prescribed nocturnal PAP therapy   Osteopenia    PONV (postoperative nausea and vomiting)    T2DM (type 2 diabetes mellitus) (HCC)    Vaginal prolapse

## 2024-04-29 MED ORDER — DULOXETINE HCL 30 MG PO CPEP
30.0000 mg | ORAL_CAPSULE | Freq: Once | ORAL | Status: AC
  Administered 2024-04-29: 30 mg via ORAL
  Filled 2024-04-29: qty 1

## 2024-04-29 MED ORDER — DULOXETINE HCL 60 MG PO CPEP
90.0000 mg | ORAL_CAPSULE | Freq: Every day | ORAL | Status: DC
  Administered 2024-04-30: 90 mg via ORAL
  Filled 2024-04-29: qty 1

## 2024-04-29 MED ORDER — PANTOPRAZOLE SODIUM 40 MG PO TBEC
40.0000 mg | DELAYED_RELEASE_TABLET | Freq: Every day | ORAL | Status: DC
  Administered 2024-04-29 – 2024-04-30 (×2): 40 mg via ORAL
  Filled 2024-04-29 (×2): qty 1

## 2024-04-29 NOTE — ED Notes (Signed)
" °  Patient has been accepted to [facility]  Patient assigned to [address].  Accepting physician is Dr. Mabeline Raveling Call report to [phone]  Representative was Tiffany     ER Staff is aware of it:  Lyndy, ER Secretary  Dr. Ernest, ER MD  Harlene, Patient's Nurse     Patient's Family/Support System Lessie Knee (Niece) [phone] ) have been updated as well.     "

## 2024-04-29 NOTE — ED Notes (Signed)
 Patient given dinner tray.

## 2024-04-29 NOTE — ED Notes (Signed)
 IVC  GOING TO  [facility] PENDING  TRANSPORT

## 2024-04-29 NOTE — ED Notes (Signed)
 Pt provided with lunch tray. Pt sitting up eating.

## 2024-04-29 NOTE — Progress Notes (Signed)
 10:27AM  Christian @ [facility] confirmed patient's bed will be available tomorrow due to no transportation today.  Michial Skeen, [facility]

## 2024-04-29 NOTE — ED Provider Notes (Signed)
 Emergency Medicine Observation Re-evaluation Note   BP (!) 101/59   Pulse 74   Temp 98.6 F (37 C) (Oral)   Resp 18   Ht 5' (1.524 m)   Wt 65 kg   SpO2 94%   BMI 27.99 kg/m    ED Course / MDM   No reported events during my shift at the time of this note.   Pt is awaiting dispo from consultants   Ginnie Shams MD    Shams Ginnie, MD 04/29/24 [phone]

## 2024-04-29 NOTE — ED Notes (Signed)

## 2024-04-29 NOTE — ED Notes (Signed)
 Pt given all supplies to take a shower. Pt is now out of the shower and back comfortably resting in bed.

## 2024-04-29 NOTE — ED Notes (Signed)
 Breakfast tray and beverage was provided.

## 2024-04-30 ENCOUNTER — Ambulatory Visit: Admitting: Student in an Organized Health Care Education/Training Program

## 2024-04-30 NOTE — ED Notes (Signed)
 Pt up to restroom. No other needs at this time.

## 2024-04-30 NOTE — ED Notes (Signed)
 EMTALA reviewed by this RN.

## 2024-04-30 NOTE — ED Notes (Signed)
 Meal provided

## 2024-04-30 NOTE — ED Provider Notes (Signed)
 Emergency Medicine Observation Re-evaluation Note  Jean Stout is a 84 y.o. female, seen on rounds today.  Pt initially presented to the ED for complaints of Depression  Currently, the patient is resting comfortably.  Physical Exam  BP 105/62   Pulse 63   Temp 98.3 F (36.8 C) (Oral)   Resp 16   Ht 5' (1.524 m)   Wt 65 kg   SpO2 96%   BMI 27.99 kg/m  General: No acute distress Cardiac: Well-perfused extremities Lungs: No respiratory distress Psych: Appropriate mood and affect  ED Course / MDM  EKG:   I have reviewed the labs performed to date as well as medications administered while in observation.  Recent changes in the last 24 hours include none.  Plan  Current plan is for placement.   Jossie Artist POUR, MD 04/30/24 [phone]

## 2024-06-20 ENCOUNTER — Encounter: Admitting: Nurse Practitioner
